# Patient Record
Sex: Male | Born: 1955
Health system: Southern US, Community
[De-identification: ages and names within clinical notes are randomized; demographics above are authoritative.]

## PROBLEM LIST (undated history)

## (undated) DIAGNOSIS — I10 Essential (primary) hypertension: Secondary | ICD-10-CM

## (undated) DIAGNOSIS — E785 Hyperlipidemia, unspecified: Secondary | ICD-10-CM

## (undated) DIAGNOSIS — E119 Type 2 diabetes mellitus without complications: Secondary | ICD-10-CM

## (undated) HISTORY — PX: CIRCUMCISION: SUR203

## (undated) HISTORY — DX: Hyperlipidemia, unspecified: E78.5

## (undated) HISTORY — DX: Essential (primary) hypertension: I10

## (undated) HISTORY — DX: Type 2 diabetes mellitus without complications: E11.9

---

## 1998-12-23 ENCOUNTER — Ambulatory Visit: Admission: RE | Admit: 1998-12-23 | Discharge: 1998-12-23 | Payer: Self-pay | Admitting: Occupational Medicine

## 1998-12-23 ENCOUNTER — Encounter: Payer: Self-pay | Admitting: Occupational Medicine

## 2003-06-07 ENCOUNTER — Emergency Department (HOSPITAL_COMMUNITY): Admission: EM | Admit: 2003-06-07 | Discharge: 2003-06-08 | Payer: Self-pay | Admitting: Emergency Medicine

## 2003-07-05 ENCOUNTER — Encounter: Admission: RE | Admit: 2003-07-05 | Discharge: 2003-10-03 | Payer: Self-pay | Admitting: Endocrinology

## 2004-03-26 ENCOUNTER — Emergency Department (HOSPITAL_COMMUNITY): Admission: EM | Admit: 2004-03-26 | Discharge: 2004-03-26 | Payer: Self-pay | Admitting: Emergency Medicine

## 2008-03-19 ENCOUNTER — Emergency Department (HOSPITAL_COMMUNITY): Admission: EM | Admit: 2008-03-19 | Discharge: 2008-03-19 | Payer: Self-pay | Admitting: Emergency Medicine

## 2010-02-07 ENCOUNTER — Emergency Department (HOSPITAL_COMMUNITY): Admission: EM | Admit: 2010-02-07 | Discharge: 2010-02-07 | Payer: Self-pay | Admitting: Family Medicine

## 2010-06-14 ENCOUNTER — Emergency Department (HOSPITAL_COMMUNITY): Payer: No Typology Code available for payment source

## 2010-06-14 ENCOUNTER — Emergency Department (HOSPITAL_COMMUNITY)
Admission: EM | Admit: 2010-06-14 | Discharge: 2010-06-14 | Disposition: A | Discharge status: Home / Self Care | Payer: No Typology Code available for payment source | Attending: Emergency Medicine | Admitting: Emergency Medicine

## 2010-06-14 DIAGNOSIS — M25559 Pain in unspecified hip: Secondary | ICD-10-CM | POA: Insufficient documentation

## 2010-06-14 DIAGNOSIS — S93409A Sprain of unspecified ligament of unspecified ankle, initial encounter: Secondary | ICD-10-CM | POA: Insufficient documentation

## 2010-06-14 DIAGNOSIS — M25579 Pain in unspecified ankle and joints of unspecified foot: Secondary | ICD-10-CM | POA: Insufficient documentation

## 2010-06-14 DIAGNOSIS — E119 Type 2 diabetes mellitus without complications: Secondary | ICD-10-CM | POA: Insufficient documentation

## 2010-06-14 DIAGNOSIS — IMO0002 Reserved for concepts with insufficient information to code with codable children: Secondary | ICD-10-CM | POA: Insufficient documentation

## 2011-02-10 LAB — URINALYSIS, ROUTINE W REFLEX MICROSCOPIC
Nitrite: NEGATIVE
Specific Gravity, Urine: 1.028
Urobilinogen, UA: 0.2
pH: 6

## 2011-02-15 DIAGNOSIS — E119 Type 2 diabetes mellitus without complications: Secondary | ICD-10-CM | POA: Insufficient documentation

## 2011-06-21 ENCOUNTER — Ambulatory Visit (INDEPENDENT_AMBULATORY_CARE_PROVIDER_SITE_OTHER): Payer: 59 | Admitting: Physician Assistant

## 2011-06-21 ENCOUNTER — Encounter: Payer: Self-pay | Admitting: Physician Assistant

## 2011-06-21 VITALS — BP 145/88 | HR 71 | Temp 98.6°F | Resp 18 | Ht 66.0 in | Wt 188.0 lb

## 2011-06-21 DIAGNOSIS — Z0000 Encounter for general adult medical examination without abnormal findings: Secondary | ICD-10-CM

## 2011-06-21 DIAGNOSIS — E785 Hyperlipidemia, unspecified: Secondary | ICD-10-CM

## 2011-06-21 DIAGNOSIS — E782 Mixed hyperlipidemia: Secondary | ICD-10-CM

## 2011-06-21 DIAGNOSIS — IMO0001 Reserved for inherently not codable concepts without codable children: Secondary | ICD-10-CM

## 2011-06-21 DIAGNOSIS — N529 Male erectile dysfunction, unspecified: Secondary | ICD-10-CM

## 2011-06-21 DIAGNOSIS — Z1211 Encounter for screening for malignant neoplasm of colon: Secondary | ICD-10-CM

## 2011-06-21 DIAGNOSIS — E119 Type 2 diabetes mellitus without complications: Secondary | ICD-10-CM | POA: Insufficient documentation

## 2011-06-21 DIAGNOSIS — I10 Essential (primary) hypertension: Secondary | ICD-10-CM

## 2011-06-21 LAB — COMPREHENSIVE METABOLIC PANEL
Albumin: 4.5 g/dL (ref 3.5–5.2)
Alkaline Phosphatase: 64 U/L (ref 39–117)
CO2: 26 mEq/L (ref 19–32)
Chloride: 104 mEq/L (ref 96–112)
Glucose, Bld: 167 mg/dL — ABNORMAL HIGH (ref 70–99)
Potassium: 4.1 mEq/L (ref 3.5–5.3)
Sodium: 138 mEq/L (ref 135–145)
Total Protein: 7.4 g/dL (ref 6.0–8.3)

## 2011-06-21 LAB — POCT CBC
HCT, POC: 44.3 % (ref 43.5–53.7)
Hemoglobin: 15 g/dL (ref 14.1–18.1)
Lymph, poc: 2.1 (ref 0.6–3.4)
MCH, POC: 26.6 pg — AB (ref 27–31.2)
MCHC: 33.9 g/dL (ref 31.8–35.4)
MCV: 78.5 fL — AB (ref 80–97)
WBC: 5.3 10*3/uL (ref 4.6–10.2)

## 2011-06-21 LAB — MICROALBUMIN, URINE: Microalb, Ur: 1.6 mg/dL (ref 0.00–1.89)

## 2011-06-21 LAB — LIPID PANEL
LDL Cholesterol: 63 mg/dL (ref 0–99)
Triglycerides: 47 mg/dL (ref <150)

## 2011-06-21 MED ORDER — SILDENAFIL CITRATE 100 MG PO TABS: 100.0000 mg | ORAL_TABLET | Freq: Every day | ORAL | Status: DC | PRN

## 2011-06-21 NOTE — Progress Notes (Signed)
Subjective:    Patient ID: Ricardo Mills, male    DOB: Dec 22, 1955, 56 y.o.   MRN: [id number]  HPI Patient presents for complete physical. He is also looking to establish with a new primary care provider. He was previously followed at [facility] but there [employer] office has closed.  He has diabetes mellitus type 2. Last A1c was about 9. He had been at about 6% when he was exercising regularly.  Irregular, blood sugar checks. He has seen his eye doctor in the last 12 months. Last dental visit was several years ago. He only has 2 teeth remaining, notes that need to be pulled. Wears upper and lower denture plates.  He takes his prescribed medications regularly. No hypoglycemia. Hypoglycemia is accompanied by mild symptoms.   Review of Systems  Constitutional: Negative.   HENT: Negative.   Eyes: Negative.   Respiratory: Negative.   Cardiovascular: Negative.   Gastrointestinal: Negative.   Genitourinary: Negative.        Erectile dysfunction.  Viagra as worked well previously.  Musculoskeletal: Negative.   Skin: Negative.   Neurological: Negative.   Hematological: Negative.   Psychiatric/Behavioral: Negative.        Objective:   Physical Exam  Vitals reviewed. Constitutional: He is oriented to person, place, and time. He appears well-developed and well-nourished.  Non-toxic appearance. He does not have a sickly appearance. He does not appear ill. No distress.  HENT:  Head: Normocephalic and atraumatic. No trismus in the jaw.  Right Ear: Hearing, tympanic membrane, external ear and ear canal normal.  Left Ear: Hearing, tympanic membrane, external ear and ear canal normal.  Nose: Nose normal.  Mouth/Throat: Uvula is midline, oropharynx is clear and moist and mucous membranes are normal. He has dentures. No oral lesions. Abnormal dentition. No dental abscesses, uvula swelling, lacerations or dental caries. No oropharyngeal exudate.  Eyes: Conjunctivae and EOM are normal. Pupils  are equal, round, and reactive to light. Right eye exhibits no discharge. Left eye exhibits no discharge. No scleral icterus.  Fundoscopic exam:      The right eye shows no arteriolar narrowing, no AV nicking, no exudate, no hemorrhage and no papilledema. The right eye shows red reflex.The right eye shows no venous pulsations.      The left eye shows no arteriolar narrowing, no AV nicking, no exudate, no hemorrhage and no papilledema. The left eye shows red reflex.The left eye shows no venous pulsations. Neck: Normal range of motion, full passive range of motion without pain and phonation normal. Neck supple. No spinous process tenderness and no muscular tenderness present. No rigidity. No tracheal deviation, no edema, no erythema and normal range of motion present. No thyromegaly present.  Cardiovascular: Normal rate, regular rhythm, S1 normal, S2 normal, normal heart sounds, intact distal pulses and normal pulses.  Exam reveals no gallop and no friction rub.   No murmur heard. Pulmonary/Chest: Effort normal and breath sounds normal. No respiratory distress. He has no wheezes. He has no rales.  Abdominal: Soft. Normal appearance and bowel sounds are normal. He exhibits no distension and no mass. There is no hepatosplenomegaly. There is no tenderness. There is no rebound and no guarding. No hernia. Hernia confirmed negative in the right inguinal area and confirmed negative in the left inguinal area.  Genitourinary: Rectum normal, prostate normal, testes normal and penis normal. Guaiac negative stool. Circumcised. No phimosis, paraphimosis, hypospadias, penile erythema or penile tenderness. No discharge found.  Musculoskeletal: Normal range of motion. He exhibits no  edema and no tenderness.       Right shoulder: Normal.       Left shoulder: Normal.       Right elbow: Normal.      Left elbow: Normal.       Right wrist: Normal.       Left wrist: Normal.       Right hip: Normal.       Left hip: Normal.        Right knee: Normal.       Left knee: Normal.       Right ankle: Normal. Achilles tendon normal.       Left ankle: Normal. Achilles tendon normal.       Cervical back: Normal. He exhibits normal range of motion, no tenderness, no bony tenderness, no swelling, no edema, no deformity, no laceration, no pain, no spasm and normal pulse.       Thoracic back: Normal.       Lumbar back: Normal.       Right upper arm: Normal.       Left upper arm: Normal.       Right forearm: Normal.       Left forearm: Normal.       Right hand: Normal.       Left hand: Normal.       Right upper leg: Normal.       Left upper leg: Normal.       Right lower leg: Normal.       Left lower leg: Normal.       Right foot: Normal.       Left foot: Normal.  Lymphadenopathy:       Head (right side): No submental, no submandibular, no tonsillar, no preauricular, no posterior auricular and no occipital adenopathy present.       Head (left side): No submental, no submandibular, no tonsillar, no preauricular, no posterior auricular and no occipital adenopathy present.    He has no cervical adenopathy.       Right: No inguinal and no supraclavicular adenopathy present.       Left: No inguinal and no supraclavicular adenopathy present.  Neurological: He is alert and oriented to person, place, and time. He has normal strength and normal reflexes. He displays no tremor. No cranial nerve deficit. He exhibits normal muscle tone. Coordination and gait normal. GCS eye subscore is 4. GCS verbal subscore is 5. GCS motor subscore is 6.       Sensation to monofilament testing is decreased on the heels bilaterally, likely secondary to thick callous formation.  Skin: Skin is warm, dry and intact. No abrasion, no ecchymosis, no laceration, no lesion and no rash noted. He is not diaphoretic. No cyanosis or erythema. No pallor. Nails show no clubbing.  Psychiatric: He has a normal mood and affect. His speech is normal and behavior is  normal. Judgment and thought content normal. Cognition and memory are normal.   Results for orders placed in visit on 06/21/11  POCT CBC      Component Value Range   WBC 5.3  4.6 - 10.2 (K/uL)   Lymph, poc 2.1  0.6 - 3.4    POC LYMPH PERCENT 39.5  10 - 50 (%L)   MID (cbc) 0.5  0 - 0.9    POC MID % 9.1  0 - 12 (%M)   POC Granulocyte 2.7  2 - 6.9    Granulocyte percent 51.4  37 - 80 (%G)  RBC 5.64  4.69 - 6.13 (M/uL)   Hemoglobin 15.0  14.1 - 18.1 (g/dL)   HCT, POC 16.1  09.6 - 53.7 (%)   MCV 78.5 (*) 80 - 97 (fL)   MCH, POC 26.6 (*) 27 - 31.2 (pg)   MCHC 33.9  31.8 - 35.4 (g/dL)   RDW, POC 04.5     Platelet Count, POC 161  142 - 424 (K/uL)   MPV 13.8  0 - 99.8 (fL)  POCT GLYCOSYLATED HEMOGLOBIN (HGB A1C)      Component Value Range   Hemoglobin A1C 9.8    GLUCOSE, POCT (MANUAL RESULT ENTRY)      Component Value Range   POC Glucose 192    IFOBT (OCCULT BLOOD)      Component Value Range   IFOBT Negative     CMET, Lipids, PSA, TSH, urine microalbumin are pending.       Assessment & Plan:  1. Wellness exam. The patient is referred for colonoscopy. Counseled on the importance of routine dental care. Age-appropriate health guidance is provided.  2. Diabetes mellitus type 2, uncontrolled. Await remaining labs. Will likely add an additional agent. Patient is reminded that regular exercise and routine dental care can improve his glucose.  3. Hypertension. Not controlled today. He is quite frustrated by today's weight and we agreed not to adjust his antihypertensive regimen at this visit.  4. Hyperlipidemia. Await lab results. If LDL is greater than 70 we will increase the simvastatin to 40 mg daily.  5.Erectile Dysfunction. Again reminded him that lifestyle modification and improved glucose control will likely benefit him in this area. Viagra 100 mg 1 by mouth daily as needed.  The patient will continue his regular medications. Followup with me in 3 months. I will contact him  when the remaining lab results are in so that we can make adjustments for improved glucose control.

## 2011-06-21 NOTE — Patient Instructions (Addendum)
If you have not heard from the GI office about the colonoscopy in one week, please call us. If you have not heard from me about your lab work in 2 weeks, please call us.  Keeping you healthy  Get these tests  Blood pressure- Have your blood pressure checked once a year by your healthcare provider.  Normal blood pressure is 120/80  Weight- Have your body mass index (BMI) calculated to screen for obesity.  BMI is a measure of body fat based on height and weight. You can also calculate your own BMI at [email or web address].  Cholesterol- Have your cholesterol checked every year.  Diabetes- Have your blood sugar checked regularly if you have high blood pressure, high cholesterol, have a family history of diabetes or if you are overweight.  Screening for Colon Cancer- Colonoscopy starting at age 21.  Screening may begin sooner depending on your family history and other health conditions. Follow up colonoscopy as directed by your Gastroenterologist.  Screening for Prostate Cancer- Both blood work (PSA) and a rectal exam help screen for Prostate Cancer.  Screening begins at age 76 with African-American men and at age 15 with Caucasian men.  Screening may begin sooner depending on your family history.  Take these medicines  Aspirin- One aspirin daily can help prevent Heart disease and Stroke.  Flu shot- Every fall.  Tetanus- Every 10 years.  Zostavax- Once after the age of 32 to prevent Shingles.  Pneumonia shot- Once after the age of 5; if you are younger than 23, ask your healthcare provider if you need a Pneumonia shot.  Take these steps  Don't smoke- If you do smoke, talk to your doctor about quitting.  For tips on how to quit, go to www.smokefree.gov or call 1-800-QUIT-NOW.  Be physically active- Exercise 5 days a week for at least 30 minutes.  If you are not already physically active start slow and gradually work up to 30 minutes of moderate physical activity.  Examples of  moderate activity include walking briskly, mowing the yard, dancing, swimming, bicycling, etc.  Eat a healthy diet- Eat a variety of healthy food such as fruits, vegetables, low fat milk, low fat cheese, yogurt, lean meant, poultry, fish, beans, tofu, etc. For more information go to www.thenutritionsource.org  Drink alcohol in moderation- Limit alcohol intake to less than two drinks a day. Never drink and drive.  Dentist- Brush and floss twice daily; visit your dentist twice a year.  Depression- Your emotional health is as important as your physical health. If you're feeling down, or losing interest in things you would normally enjoy please talk to your healthcare provider.  Eye exam- Visit your eye doctor every year.  Safe sex- If you may be exposed to a sexually transmitted infection, use a condom.  Seat belts- Seat belts can save your life; always wear one.  Smoke/Carbon Monoxide detectors- These detectors need to be installed on the appropriate level of your home.  Replace batteries at least once a year.  Skin cancer- When out in the sun, cover up and use sunscreen 15 SPF or higher.  Violence- If anyone is threatening you, please tell your healthcare provider.  Living Will/ Health care power of attorney- Speak with your healthcare provider and family.

## 2011-06-22 ENCOUNTER — Other Ambulatory Visit: Payer: Self-pay | Admitting: Physician Assistant

## 2011-06-22 MED ORDER — SAXAGLIPTIN HCL 2.5 MG PO TABS: 2.5000 mg | ORAL_TABLET | Freq: Every day | ORAL | Status: DC

## 2011-06-23 ENCOUNTER — Telehealth: Payer: Self-pay

## 2011-06-23 NOTE — Telephone Encounter (Signed)
Patient requesting physical paper be filled out. Chart is at nurses station.

## 2011-06-24 NOTE — Telephone Encounter (Signed)
I've completed the forms.  Please scan in to patient record and advise him he can pick up the originals.  csj

## 2011-06-24 NOTE — Telephone Encounter (Signed)
Forms copied and in drawer for p/up. LMOM that both pt and wife's forms are ready.

## 2011-06-24 NOTE — Telephone Encounter (Signed)
Chelle, PE form is in your box

## 2011-07-06 ENCOUNTER — Other Ambulatory Visit: Payer: Self-pay

## 2011-07-06 DIAGNOSIS — IMO0001 Reserved for inherently not codable concepts without codable children: Secondary | ICD-10-CM

## 2011-07-06 NOTE — Telephone Encounter (Signed)
metFORMIN (GLUCOPHAGE) 500 MG tablet PT IS REQUESTING REFILLS  [facility] [address]  [phone]

## 2011-07-08 MED ORDER — SAXAGLIPTIN HCL 5 MG PO TABS: 5.0000 mg | ORAL_TABLET | Freq: Every day | ORAL | Status: DC

## 2011-07-08 MED ORDER — METFORMIN HCL 500 MG PO TABS: 1000.0000 mg | ORAL_TABLET | Freq: Two times a day (BID) | ORAL | Status: DC

## 2011-07-08 NOTE — Telephone Encounter (Signed)
I refilled the metformin, and increased the dose of the Onglyza to 5 mg (he can use up the 2.5 mg by taking 2 together each day).

## 2011-07-08 NOTE — Telephone Encounter (Signed)
LMOM on home # with details and instructions below. Asked for CB with any questions

## 2011-08-26 ENCOUNTER — Other Ambulatory Visit: Payer: Self-pay

## 2011-08-26 MED ORDER — SIMVASTATIN 20 MG PO TABS: 20.0000 mg | ORAL_TABLET | Freq: Every evening | ORAL | Status: DC

## 2011-08-26 MED ORDER — GLIPIZIDE ER 10 MG PO TB24: 10.0000 mg | ORAL_TABLET | Freq: Every day | ORAL | Status: DC

## 2011-08-26 MED ORDER — LOSARTAN POTASSIUM 100 MG PO TABS: 100.0000 mg | ORAL_TABLET | Freq: Every day | ORAL | Status: DC

## 2011-10-08 ENCOUNTER — Ambulatory Visit: Payer: 59 | Admitting: Physician Assistant

## 2011-10-31 ENCOUNTER — Other Ambulatory Visit: Payer: Self-pay

## 2011-10-31 DIAGNOSIS — IMO0001 Reserved for inherently not codable concepts without codable children: Secondary | ICD-10-CM

## 2011-10-31 MED ORDER — SAXAGLIPTIN HCL 5 MG PO TABS: 5.0000 mg | ORAL_TABLET | Freq: Every day | ORAL | Status: DC

## 2011-11-13 ENCOUNTER — Other Ambulatory Visit: Payer: Self-pay | Admitting: Physician Assistant

## 2011-11-26 ENCOUNTER — Other Ambulatory Visit: Payer: Self-pay | Admitting: Physician Assistant

## 2011-12-24 ENCOUNTER — Ambulatory Visit (INDEPENDENT_AMBULATORY_CARE_PROVIDER_SITE_OTHER): Payer: 59 | Admitting: Emergency Medicine

## 2011-12-24 VITALS — BP 124/84 | HR 74 | Temp 98.0°F | Resp 16 | Ht 66.0 in | Wt 190.0 lb

## 2011-12-24 DIAGNOSIS — R0989 Other specified symptoms and signs involving the circulatory and respiratory systems: Secondary | ICD-10-CM

## 2011-12-24 DIAGNOSIS — E119 Type 2 diabetes mellitus without complications: Secondary | ICD-10-CM

## 2011-12-24 DIAGNOSIS — E7800 Pure hypercholesterolemia, unspecified: Secondary | ICD-10-CM

## 2011-12-24 DIAGNOSIS — R0609 Other forms of dyspnea: Secondary | ICD-10-CM

## 2011-12-24 DIAGNOSIS — I10 Essential (primary) hypertension: Secondary | ICD-10-CM

## 2011-12-24 DIAGNOSIS — R0683 Snoring: Secondary | ICD-10-CM

## 2011-12-24 LAB — POCT CBC
Hemoglobin: 14.7 g/dL (ref 14.1–18.1)
MCH, POC: 25.9 pg — AB (ref 27–31.2)
MCV: 80.7 fL (ref 80–97)
MID (cbc): 0.5 (ref 0–0.9)
Platelet Count, POC: 161 10*3/uL (ref 142–424)
RBC: 5.68 M/uL (ref 4.69–6.13)
WBC: 6 10*3/uL (ref 4.6–10.2)

## 2011-12-24 MED ORDER — SAXAGLIPTIN HCL 5 MG PO TABS: 5.0000 mg | ORAL_TABLET | Freq: Every day | ORAL | Status: DC

## 2011-12-24 MED ORDER — METFORMIN HCL 500 MG PO TABS: 1000.0000 mg | ORAL_TABLET | Freq: Two times a day (BID) | ORAL | Status: DC

## 2011-12-24 MED ORDER — LOSARTAN POTASSIUM 100 MG PO TABS: 100.0000 mg | ORAL_TABLET | Freq: Every day | ORAL | Status: DC

## 2011-12-24 MED ORDER — GLIPIZIDE ER 10 MG PO TB24: 10.0000 mg | ORAL_TABLET | Freq: Every day | ORAL | Status: DC

## 2011-12-24 MED ORDER — SIMVASTATIN 20 MG PO TABS: 20.0000 mg | ORAL_TABLET | Freq: Every evening | ORAL | Status: DC

## 2011-12-24 NOTE — Progress Notes (Signed)
  Subjective:    Patient ID: Ricardo Mills, male    DOB: 1956/01/08, 56 y.o.   MRN: [id number]  HPI  56 year old male comes in today for medication refill.  Under tx for diabetes and hypertension.  Feels good.  He works in a [employer] and very physical work.  No chest pain and no shortness of breath.  Does get tired.  No trouble with feet.  No other problems and he does see an eye doctor every year.  Last time he had a physical was in February.  Was talked to about a colonoscopy in Feb.  He would like to know what he could do for bad breath.  He was given magic mouth wash before.      Review of Systems     Objective:   Physical Exam HEENT exam is unremarkable. Neck is supple chest is clear. Heart regular rate no murmurs. Abdomen soft nontender. Extremity has a normal filament test the  Results for orders placed in visit on 12/24/11  POCT CBC      Component Value Range   WBC 6.0  4.6 - 10.2 K/uL   Lymph, poc 2.3  0.6 - 3.4   POC LYMPH PERCENT 38.1  10 - 50 %L   MID (cbc) 0.5  0 - 0.9   POC MID % 8.7  0 - 12 %M   POC Granulocyte 3.2  2 - 6.9   Granulocyte percent 53.2  37 - 80 %G   RBC 5.68  4.69 - 6.13 M/uL   Hemoglobin 14.7  14.1 - 18.1 g/dL   HCT, POC 40.9  81.1 - 53.7 %   MCV 80.7  80 - 97 fL   MCH, POC 25.9 (*) 27 - 31.2 pg   MCHC 32.1  31.8 - 35.4 g/dL   RDW, POC 91.4     Platelet Count, POC 161  142 - 424 K/uL   MPV 14.7  0 - 99.8 fL  GLUCOSE, POCT (MANUAL RESULT ENTRY)      Component Value Range   POC Glucose 259 (*) 70 - 99 mg/dl  POCT GLYCOSYLATED HEMOGLOBIN (HGB A1C)      Component Value Range   Hemoglobin A1C 7.6          Assessment & Plan:  Patient here for followup diabetes and hypertension high cholesterol.

## 2011-12-25 LAB — COMPREHENSIVE METABOLIC PANEL
Albumin: 4.5 g/dL (ref 3.5–5.2)
CO2: 28 mEq/L (ref 19–32)
Calcium: 9.2 mg/dL (ref 8.4–10.5)
Chloride: 101 mEq/L (ref 96–112)
Glucose, Bld: 234 mg/dL — ABNORMAL HIGH (ref 70–99)
Potassium: 4.3 mEq/L (ref 3.5–5.3)
Sodium: 136 mEq/L (ref 135–145)
Total Protein: 7.4 g/dL (ref 6.0–8.3)

## 2011-12-25 LAB — LIPID PANEL
Cholesterol: 126 mg/dL (ref 0–200)
Triglycerides: 69 mg/dL (ref <150)

## 2012-03-28 ENCOUNTER — Ambulatory Visit (INDEPENDENT_AMBULATORY_CARE_PROVIDER_SITE_OTHER): Payer: 59 | Admitting: Physician Assistant

## 2012-03-28 VITALS — BP 122/80 | HR 72 | Temp 97.8°F | Resp 16 | Ht 66.0 in | Wt 187.0 lb

## 2012-03-28 DIAGNOSIS — E7800 Pure hypercholesterolemia, unspecified: Secondary | ICD-10-CM

## 2012-03-28 DIAGNOSIS — R6889 Other general symptoms and signs: Secondary | ICD-10-CM

## 2012-03-28 DIAGNOSIS — R196 Halitosis: Secondary | ICD-10-CM | POA: Insufficient documentation

## 2012-03-28 DIAGNOSIS — E785 Hyperlipidemia, unspecified: Secondary | ICD-10-CM

## 2012-03-28 DIAGNOSIS — Z23 Encounter for immunization: Secondary | ICD-10-CM

## 2012-03-28 DIAGNOSIS — N529 Male erectile dysfunction, unspecified: Secondary | ICD-10-CM

## 2012-03-28 DIAGNOSIS — IMO0001 Reserved for inherently not codable concepts without codable children: Secondary | ICD-10-CM

## 2012-03-28 DIAGNOSIS — E119 Type 2 diabetes mellitus without complications: Secondary | ICD-10-CM

## 2012-03-28 DIAGNOSIS — I10 Essential (primary) hypertension: Secondary | ICD-10-CM

## 2012-03-28 LAB — LIPID PANEL
Cholesterol: 132 mg/dL (ref 0–200)
VLDL: 12 mg/dL (ref 0–40)

## 2012-03-28 LAB — COMPREHENSIVE METABOLIC PANEL
ALT: 18 U/L (ref 0–53)
AST: 16 U/L (ref 0–37)
CO2: 25 mEq/L (ref 19–32)
Calcium: 9.5 mg/dL (ref 8.4–10.5)
Chloride: 101 mEq/L (ref 96–112)
Sodium: 135 mEq/L (ref 135–145)
Total Protein: 7.4 g/dL (ref 6.0–8.3)

## 2012-03-28 LAB — POCT GLYCOSYLATED HEMOGLOBIN (HGB A1C): Hemoglobin A1C: 10.8

## 2012-03-28 LAB — TSH: TSH: 1.771 u[IU]/mL (ref 0.350–4.500)

## 2012-03-28 MED ORDER — LOSARTAN POTASSIUM 100 MG PO TABS: 100.0000 mg | ORAL_TABLET | Freq: Every day | ORAL | Status: DC

## 2012-03-28 MED ORDER — GLIPIZIDE ER 10 MG PO TB24: 10.0000 mg | ORAL_TABLET | Freq: Every day | ORAL | Status: DC

## 2012-03-28 MED ORDER — METFORMIN HCL 500 MG PO TABS: 1000.0000 mg | ORAL_TABLET | Freq: Two times a day (BID) | ORAL | Status: DC

## 2012-03-28 MED ORDER — SAXAGLIPTIN HCL 5 MG PO TABS: 5.0000 mg | ORAL_TABLET | Freq: Every day | ORAL | Status: DC

## 2012-03-28 MED ORDER — SIMVASTATIN 20 MG PO TABS: 20.0000 mg | ORAL_TABLET | Freq: Every evening | ORAL | Status: DC

## 2012-03-28 NOTE — Assessment & Plan Note (Signed)
Uncontrolled. Restart Onglyza, and take it EVERY DAY along with Metformin and Glucotrol XL.  Restart exercise regimen and healthy eating choices.  These efforts have been successful previously.  If still uncontrolled at his next visit, we will initiate insulin therapy.  Pneumococcal vaccine given. Refused Flu vaccine, despite urging. F/U with eye specialist.

## 2012-03-28 NOTE — Assessment & Plan Note (Addendum)
Await lab results.  Exercise, healthy eating. Continue Zocor.

## 2012-03-28 NOTE — Assessment & Plan Note (Signed)
Continue oral hygiene.  Consider treatment for reflux if persists once glucose control achieved.

## 2012-03-28 NOTE — Progress Notes (Signed)
Subjective:    Patient ID: Ricardo Mills, male    DOB: 1955-05-31, 56 y.o.   MRN: [id number]  HPI This 56 y.o. male presents for evaluation of chronic medical problems.  He has not been taking Onglyza regularly, and didn't take it at all for several months after his last visit, due to cost.   Frequency of home glucose monitoring: none Sees a dentist Q6 months, eye specialist every two years (he is due now). Checks feet daily. Is not current with influenza vaccine. Is not current with pneumococcal vaccine.  Review of Systems Denies chest pain, shortness of breath, HA, dizziness, vision change, nausea, vomiting, diarrhea, constipation, melena, hematochezia, dysuria, increased urinary urgency or frequency, increased hunger or thirst, unintentional weight change, rash.  Reports that the Viagra is no longer effective. He has difficulty getting and maintaining an erection.  Also c/o bad breath.  Wears dentures, cleans them daily, brushes the tongue daily.  Some reflux at night. Some achiness in both legs, RIGHT hand.  "It's arthritis, I guess."  LEFT hand dominant.  Past Medical History  Diagnosis Date  . Diabetes mellitus without complication   . Hyperlipidemia   . Hypertension    History reviewed. No pertinent past surgical history.  Prior to Admission medications   Medication Sig Start Date End Date Taking? Authorizing Provider  glipiZIDE (GLUCOTROL XL) 10 MG 24 hr tablet Take 1 tablet (10 mg total) by mouth daily. 12/24/11  Yes Collene Gobble, MD  losartan (COZAAR) 100 MG tablet Take 1 tablet (100 mg total) by mouth daily. 12/24/11  Yes Collene Gobble, MD  metFORMIN (GLUCOPHAGE) 500 MG tablet Take 2 tablets (1,000 mg total) by mouth 2 (two) times daily with a meal. NEEDS OFFICE VISIT FOR REFILLS 12/24/11  Yes Collene Gobble, MD  saxagliptin HCl (ONGLYZA) 5 MG TABS tablet Take 1 tablet (5 mg total) by mouth daily. Needs office visit (second notice) 12/24/11  Yes Collene Gobble, MD  simvastatin  (ZOCOR) 20 MG tablet Take 1 tablet (20 mg total) by mouth every evening. 12/24/11  Yes Collene Gobble, MD  sildenafil (VIAGRA) 100 MG tablet Take 1 tablet (100 mg total) by mouth daily as needed for erectile dysfunction. 06/21/11 07/21/11  Denyla Cortese Tessa Lerner, PA-C    No Known Allergies  History   Social History  . Marital Status: Married    Spouse Name: [place]    Number of Children: 4  . Years of Education: 12   Occupational History  . 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 Smoking status: Never Smoker   . Smokeless tobacco: Never Used  . Alcohol Use: No     Comment: former alcohol use/ not currently  . Drug Use: No  . Sexually Active: Yes -- Male partner(s)   Other Topics Concern  . Not on file   Social History Narrative   Lives with his wife.    Family History  Problem Relation Age of Onset  . Diabetes Mother   . Hyperlipidemia Mother   . Hypertension Mother   . Diabetes Father   . Cancer Sister 42    uterus?  . Diabetes Sister   . Hypertension Brother   . Diabetes Sister   . Diabetes Sister   . Diabetes Sister   . Diabetes Brother   . Hyperlipidemia Brother   . Hypertension Brother  Objective:   Physical Exam Blood pressure 122/80, pulse 72, temperature 97.8 F (36.6 C), temperature source Oral, resp. rate 16, height 5\' 6"  (1.676 m), weight 187 lb (84.823 kg), SpO2 98.00%. Body mass index is 30.18 kg/(m^2). Well-developed, well nourished BM who is awake, alert and oriented, in NAD. HEENT: [place]/AT, PERRL, EOMI.  Sclera and conjunctiva are clear.  EAC are patent, TMs are normal in appearance. Nasal mucosa is pink and moist. OP is clear. Fully compensated edentula Neck: supple, non-tender, no lymphadenopathy, thyromegaly. Heart: RRR, no murmur Lungs: normal effort, CTA Extremities: no cyanosis, clubbing or edema. See DM foot exam. Skin: warm and dry without  rash. Psychologic: good mood and appropriate affect, normal speech and behavior.  Results for orders placed in visit on 03/28/12  GLUCOSE, POCT (MANUAL RESULT ENTRY)      Component Value Range   POC Glucose 237 (*) 70 - 99 mg/dl  POCT GLYCOSYLATED HEMOGLOBIN (HGB A1C)      Component Value Range   Hemoglobin A1C 10.8        Assessment & Plan:   1. Type II or unspecified type diabetes mellitus without mention of complication, uncontrolled  POCT glucose (manual entry), POCT glycosylated hemoglobin (Hb A1C), Comprehensive metabolic panel  2. HTN (hypertension)  TSH  3. Hyperlipidemia LDL goal <70  Lipid panel  4. Halitosis    5. ED (erectile dysfunction)  Testosterone  6. Need for pneumococcal vaccination  Pneumococcal polysaccharide vaccine 23-valent greater than or equal to 2yo subcutaneous/IM

## 2012-03-28 NOTE — Patient Instructions (Signed)
Take all your medications, as prescribed, every single day. Restart your exercise program. Check your blood sugar-sometimes fasting (when you haven't had anything to eat or drink for 8-12 hours; it should be below 140) and sometimes 2-3 hours after your largest meal of the day (should be below 180).

## 2012-03-28 NOTE — Assessment & Plan Note (Signed)
Controlled.  Update CMET, also check TSH.  Continue Cozaar.

## 2012-03-28 NOTE — Assessment & Plan Note (Signed)
Refill Viagra.  Likely, loss of effectiveness is due to loss of glucose control.  Await Testosterone level.  Consider referral to urology if symptoms persist once glucose well controlled.

## 2012-03-29 ENCOUNTER — Encounter: Payer: Self-pay | Admitting: Physician Assistant

## 2012-03-29 LAB — TESTOSTERONE: Testosterone: 452.42 ng/dL (ref 300–890)

## 2012-05-14 ENCOUNTER — Ambulatory Visit (INDEPENDENT_AMBULATORY_CARE_PROVIDER_SITE_OTHER): Payer: 59 | Admitting: Family Medicine

## 2012-05-14 VITALS — BP 136/84 | HR 88 | Temp 98.1°F | Resp 16 | Ht 67.0 in | Wt 190.0 lb

## 2012-05-14 DIAGNOSIS — R519 Headache, unspecified: Secondary | ICD-10-CM

## 2012-05-14 DIAGNOSIS — R51 Headache: Secondary | ICD-10-CM

## 2012-05-14 DIAGNOSIS — J019 Acute sinusitis, unspecified: Secondary | ICD-10-CM

## 2012-05-14 LAB — POCT CBC
Granulocyte percent: 52.4 % (ref 37–80)
HCT, POC: 45.9 % (ref 43.5–53.7)
Hemoglobin: 15 g/dL (ref 14.1–18.1)
Lymph, poc: 2.2 (ref 0.6–3.4)
MCH, POC: 26.3 pg — AB (ref 27–31.2)
MCHC: 32.7 g/dL (ref 31.8–35.4)
MCV: 80.6 fL (ref 80–97)
MID (cbc): 0.5 (ref 0–0.9)
MPV: 14 fL (ref 0–99.8)
POC Granulocyte: 2.9 (ref 2–6.9)
POC LYMPH PERCENT: 38.4 %L (ref 10–50)
POC MID %: 9.2 % (ref 0–12)
Platelet Count, POC: 161 10*3/uL (ref 142–424)
RBC: 5.7 M/uL (ref 4.69–6.13)
RDW, POC: 14.8 %
WBC: 5.6 10*3/uL (ref 4.6–10.2)

## 2012-05-14 LAB — POCT SEDIMENTATION RATE: POCT SED RATE: 5 mm/h (ref 0–22)

## 2012-05-14 MED ORDER — FLUTICASONE PROPIONATE 50 MCG/ACT NA SUSP: 2.0000 | Freq: Every day | NASAL | Status: DC

## 2012-05-14 MED ORDER — AMOXICILLIN 875 MG PO TABS: 875.0000 mg | ORAL_TABLET | Freq: Two times a day (BID) | ORAL | Status: DC

## 2012-05-14 NOTE — Progress Notes (Signed)
[facility]:  Office Visit  Chief Complaint:  Chief Complaint  Patient presents with  . Headache    Left side x 2 -3 days    HPI: Ricardo ALLBAUGH is a 57 y.o. male who complains of  2-3 day behind right eye and right temporal area. + allergies. Denies cough, fevers, chill, ear pain, Tried Ibuporfen without relief. Denies confusion. Aching pain, constant , took Ibupfrofen with relief for 4 hrs then HA returns, HAs never had pain lasting this long.  Has history of migrain HATook 4 Ibuprofen yesterdady and got really dizzy. No vision changes. Denies nausea, vomiting. No tinnitus. + stress at work. He states that both diabetes and HTN are well controlled.  Past Medical History  Diagnosis Date  . Diabetes mellitus without complication   . Hyperlipidemia   . Hypertension    History reviewed. No pertinent past surgical history. History   Social History  . Marital Status: Married    Spouse Name: [place]    Number of Children: 4  . Years of Education: 12   Occupational History  . 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 Smoking status: Never Smoker   . Smokeless tobacco: Never Used  . Alcohol Use: No     Comment: former alcohol use/ not currently  . Drug Use: No  . Sexually Active: Yes -- Male partner(s)   Other Topics Concern  . None   Social History Narrative   Lives with his wife.   Family History  Problem Relation Age of Onset  . Diabetes Mother   . Hyperlipidemia Mother   . Hypertension Mother   . Diabetes Father   . Cancer Sister 7    uterus?  . Diabetes Sister   . Hypertension Brother   . Diabetes Sister   . Diabetes Sister   . Diabetes Sister   . Diabetes Brother   . Hyperlipidemia Brother   . Hypertension Brother    No Known Allergies Prior to Admission medications   Medication Sig Start Date End Date Taking? Authorizing Provider  glipiZIDE  (GLUCOTROL XL) 10 MG 24 hr tablet Take 1 tablet (10 mg total) by mouth daily. 03/28/12  Yes Chelle S Jeffery, PA-C  losartan (COZAAR) 100 MG tablet Take 1 tablet (100 mg total) by mouth daily. 03/28/12  Yes Chelle S Jeffery, PA-C  metFORMIN (GLUCOPHAGE) 500 MG tablet Take 2 tablets (1,000 mg total) by mouth 2 (two) times daily with a meal. 03/28/12  Yes Chelle S Jeffery, PA-C  saxagliptin HCl (ONGLYZA) 5 MG TABS tablet Take 1 tablet (5 mg total) by mouth daily. 03/28/12  Yes Chelle S Jeffery, PA-C  sildenafil (VIAGRA) 100 MG tablet Take 1 tablet (100 mg total) by mouth daily as needed for erectile dysfunction. 06/21/11 06/13/12 Yes Chelle S Jeffery, PA-C  simvastatin (ZOCOR) 20 MG tablet Take 1 tablet (20 mg total) by mouth every evening. 03/28/12  Yes Chelle S Jeffery, PA-C     ROS: The patient denies fevers, chills, night sweats, unintentional weight loss, chest pain, palpitations, wheezing, dyspnea on exertion, nausea, vomiting, abdominal pain, dysuria, hematuria, melena, numbness, weakness, or tingling.   All other systems have been reviewed and were otherwise negative with the exception of those mentioned in the HPI and as above.    PHYSICAL EXAM: Filed Vitals:   05/14/12 1612  BP: 136/84  Pulse: 88  Temp: 98.1 F (36.7  C)  Resp: 16   Filed Vitals:   05/14/12 1612  Height: 5\' 7"  (1.702 m)  Weight: 190 lb (86.183 kg)   Body mass index is 29.76 kg/(m^2).  General: Alert, no acute distress HEENT:  Normocephalic, atraumatic, oropharynx patent. TM nl, + left sinus pressure. No exudates. EOMI, PERRLA, fundoscopic exam nl Cardiovascular:  Regular rate and rhythm, no rubs murmurs or gallops.  No Carotid bruits, radial pulse intact. No pedal edema.  Respiratory: Clear to auscultation bilaterally.  No wheezes, rales, or rhonchi.  No cyanosis, no use of accessory musculature GI: No organomegaly, abdomen is soft and non-tender, positive bowel sounds.  No masses. Skin: No rashes. Neurologic:  Facial musculature symmetric. Psychiatric: Patient is appropriate throughout our interaction. Lymphatic: No cervical lymphadenopathy Musculoskeletal: Gait intact.   LABS: Results for orders placed in visit on 05/14/12  POCT CBC      Component Value Range   WBC 5.6  4.6 - 10.2 K/uL   Lymph, poc 2.2  0.6 - 3.4   POC LYMPH PERCENT 38.4  10 - 50 %L   MID (cbc) 0.5  0 - 0.9   POC MID % 9.2  0 - 12 %M   POC Granulocyte 2.9  2 - 6.9   Granulocyte percent 52.4  37 - 80 %G   RBC 5.70  4.69 - 6.13 M/uL   Hemoglobin 15.0  14.1 - 18.1 g/dL   HCT, POC 16.1  09.6 - 53.7 %   MCV 80.6  80 - 97 fL   MCH, POC 26.3 (*) 27 - 31.2 pg   MCHC 32.7  31.8 - 35.4 g/dL   RDW, POC 04.5     Platelet Count, POC 161  142 - 424 K/uL   MPV 14.0  0 - 99.8 fL     EKG/XRAY:   Primary read interpreted by Dr. Conley Rolls at [facility].   ASSESSMENT/PLAN: Encounter Diagnoses  Name Primary?  . Temporal pain Yes  . Acute sinusitis    HA most likely related to sinus issues Rx Amoxacillin 875 mg BID x 10 Days Rx Flonase NS Labs: Pending ESR to r/o temporal arteritis since has temporal pain F/u prn, go to ER for worsening sxs or f/u with [place]    Ameila Weldon PHUONG, DO 05/14/2012 6:27 PM

## 2012-06-30 ENCOUNTER — Ambulatory Visit: Payer: 59 | Admitting: Physician Assistant

## 2012-07-27 ENCOUNTER — Other Ambulatory Visit: Payer: Self-pay | Admitting: Physician Assistant

## 2012-08-21 ENCOUNTER — Ambulatory Visit (INDEPENDENT_AMBULATORY_CARE_PROVIDER_SITE_OTHER): Payer: 59 | Admitting: Family Medicine

## 2012-08-21 VITALS — BP 127/83 | HR 86 | Temp 98.1°F | Resp 16 | Ht 66.5 in | Wt 189.0 lb

## 2012-08-21 DIAGNOSIS — E119 Type 2 diabetes mellitus without complications: Secondary | ICD-10-CM

## 2012-08-21 DIAGNOSIS — Z0000 Encounter for general adult medical examination without abnormal findings: Secondary | ICD-10-CM

## 2012-08-21 DIAGNOSIS — B370 Candidal stomatitis: Secondary | ICD-10-CM

## 2012-08-21 DIAGNOSIS — I10 Essential (primary) hypertension: Secondary | ICD-10-CM

## 2012-08-21 DIAGNOSIS — N529 Male erectile dysfunction, unspecified: Secondary | ICD-10-CM

## 2012-08-21 DIAGNOSIS — E7800 Pure hypercholesterolemia, unspecified: Secondary | ICD-10-CM

## 2012-08-21 LAB — LIPID PANEL
Cholesterol: 117 mg/dL (ref 0–200)
HDL: 38 mg/dL — ABNORMAL LOW (ref >39)
LDL Cholesterol: 65 mg/dL (ref 0–99)
Total CHOL/HDL Ratio: 3.1 Ratio
Triglycerides: 69 mg/dL (ref <150)
VLDL: 14 mg/dL (ref 0–40)

## 2012-08-21 LAB — COMPREHENSIVE METABOLIC PANEL
ALT: 24 U/L (ref 0–53)
AST: 18 U/L (ref 0–37)
Albumin: 4.4 g/dL (ref 3.5–5.2)
Alkaline Phosphatase: 63 U/L (ref 39–117)
BUN: 13 mg/dL (ref 6–23)
CO2: 21 mEq/L (ref 19–32)
Calcium: 9.8 mg/dL (ref 8.4–10.5)
Chloride: 103 mEq/L (ref 96–112)
Creat: 1.01 mg/dL (ref 0.50–1.35)
Glucose, Bld: 231 mg/dL — ABNORMAL HIGH (ref 70–99)
Potassium: 4.1 mEq/L (ref 3.5–5.3)
Sodium: 135 mEq/L (ref 135–145)
Total Bilirubin: 0.4 mg/dL (ref 0.3–1.2)
Total Protein: 7.3 g/dL (ref 6.0–8.3)

## 2012-08-21 LAB — POCT GLYCOSYLATED HEMOGLOBIN (HGB A1C): Hemoglobin A1C: 10.7

## 2012-08-21 MED ORDER — SIMVASTATIN 20 MG PO TABS: 20.0000 mg | ORAL_TABLET | Freq: Every evening | ORAL | Status: DC

## 2012-08-21 MED ORDER — GLIPIZIDE ER 10 MG PO TB24: 10.0000 mg | ORAL_TABLET | Freq: Every day | ORAL | Status: DC

## 2012-08-21 MED ORDER — LOSARTAN POTASSIUM 100 MG PO TABS: 100.0000 mg | ORAL_TABLET | Freq: Every day | ORAL | Status: DC

## 2012-08-21 MED ORDER — VARDENAFIL HCL 20 MG PO TABS: 20.0000 mg | ORAL_TABLET | Freq: Every day | ORAL | Status: DC | PRN

## 2012-08-21 MED ORDER — FLUCONAZOLE 150 MG PO TABS: 150.0000 mg | ORAL_TABLET | Freq: Once | ORAL | Status: DC

## 2012-08-21 MED ORDER — SAXAGLIPTIN HCL 5 MG PO TABS: 5.0000 mg | ORAL_TABLET | Freq: Every day | ORAL | Status: DC

## 2012-08-21 MED ORDER — METFORMIN HCL 500 MG PO TABS: 1000.0000 mg | ORAL_TABLET | Freq: Two times a day (BID) | ORAL | Status: DC

## 2012-08-21 NOTE — Progress Notes (Signed)
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  He needs his meds refilled.  He is having no new problems  We discussed diet and exercise at length (20 minutes)  Obj:  NAD HEENT: geographic tongue, with white coating Chest: clear Heart:  Reg, no murmur Abdomen: soft, nontender Ext: no ulcers, rash, change in sensation or edema Gait:  Normal  We also discussed need for preventive care:  Cardiac and GI Results for orders placed in visit on 08/21/12  POCT GLYCOSYLATED HEMOGLOBIN (HGB A1C)      Result Value Range   Hemoglobin A1C 10.7       Diabetes mellitus - Plan: saxagliptin HCl (ONGLYZA) 5 MG TABS tablet, metFORMIN (GLUCOPHAGE) 500 MG tablet, glipiZIDE (GLUCOTROL XL) 10 MG 24 hr tablet, Comprehensive metabolic panel, POCT glycosylated hemoglobin (Hb A1C), Lipid panel, Ambulatory referral to Cardiology  Erectile dysfunction - Plan: vardenafil (LEVITRA) 20 MG tablet, Ambulatory referral to Cardiology  High cholesterol - Plan: simvastatin (ZOCOR) 20 MG tablet, Ambulatory referral to Cardiology  Hypertension - Plan: losartan (COZAAR) 100 MG tablet, Comprehensive metabolic panel, POCT glycosylated hemoglobin (Hb A1C), Lipid panel, Ambulatory referral to Cardiology  Thrush, oral - Plan: fluconazole (DIFLUCAN) 150 MG tablet  Routine general medical examination at a health care facility - Plan: Ambulatory referral to Gastroenterology

## 2012-08-22 ENCOUNTER — Encounter: Payer: Self-pay | Admitting: Cardiovascular Disease

## 2012-08-22 ENCOUNTER — Encounter: Payer: Self-pay | Admitting: *Deleted

## 2012-08-23 ENCOUNTER — Institutional Professional Consult (permissible substitution): Payer: 59 | Admitting: Cardiovascular Disease

## 2012-08-26 ENCOUNTER — Encounter: Payer: Self-pay | Admitting: Cardiovascular Disease

## 2012-09-14 ENCOUNTER — Other Ambulatory Visit: Payer: Self-pay | Admitting: Family Medicine

## 2012-09-14 ENCOUNTER — Telehealth: Payer: Self-pay

## 2012-09-14 MED ORDER — SILDENAFIL CITRATE 100 MG PO TABS: 100.0000 mg | ORAL_TABLET | ORAL | Status: DC | PRN

## 2012-09-14 NOTE — Telephone Encounter (Signed)
Pharm requests RF of Viagra 100 mg #7.

## 2013-03-01 ENCOUNTER — Telehealth: Payer: Self-pay | Admitting: Radiology

## 2013-03-01 ENCOUNTER — Ambulatory Visit (INDEPENDENT_AMBULATORY_CARE_PROVIDER_SITE_OTHER): Payer: 59 | Admitting: Family Medicine

## 2013-03-01 VITALS — BP 138/84 | HR 80 | Temp 98.5°F | Resp 18 | Ht 66.0 in | Wt 188.0 lb

## 2013-03-01 DIAGNOSIS — R51 Headache: Secondary | ICD-10-CM

## 2013-03-01 DIAGNOSIS — B370 Candidal stomatitis: Secondary | ICD-10-CM

## 2013-03-01 DIAGNOSIS — E119 Type 2 diabetes mellitus without complications: Secondary | ICD-10-CM

## 2013-03-01 LAB — POCT CBC
Granulocyte percent: 70.1 %G (ref 37–80)
HCT, POC: 44.4 % (ref 43.5–53.7)
Hemoglobin: 14.4 g/dL (ref 14.1–18.1)
Lymph, poc: 1.5 (ref 0.6–3.4)
MCH, POC: 26.5 pg — AB (ref 27–31.2)
MCHC: 32.4 g/dL (ref 31.8–35.4)
MCV: 81.6 fL (ref 80–97)
MID (cbc): 0.4 (ref 0–0.9)
MPV: 14.2 fL (ref 0–99.8)
POC Granulocyte: 4.6 (ref 2–6.9)
POC LYMPH PERCENT: 23.3 %L (ref 10–50)
POC MID %: 6.6 %M (ref 0–12)
Platelet Count, POC: 141 10*3/uL — AB (ref 142–424)
RBC: 5.44 M/uL (ref 4.69–6.13)
RDW, POC: 14.3 %
WBC: 6.6 10*3/uL (ref 4.6–10.2)

## 2013-03-01 LAB — POCT GLYCOSYLATED HEMOGLOBIN (HGB A1C): Hemoglobin A1C: 9.7

## 2013-03-01 LAB — GLUCOSE, POCT (MANUAL RESULT ENTRY): POC Glucose: 308 mg/dl — AB (ref 70–99)

## 2013-03-01 MED ORDER — MAGIC MOUTHWASH: 5.0000 mL | Freq: Three times a day (TID) | ORAL | Status: DC

## 2013-03-01 MED ORDER — KETOPROFEN 50 MG PO CAPS: 50.0000 mg | ORAL_CAPSULE | Freq: Four times a day (QID) | ORAL | Status: DC | PRN

## 2013-03-01 MED ORDER — GLIPIZIDE ER 10 MG PO TB24: ORAL_TABLET | ORAL | Status: DC

## 2013-03-01 NOTE — Telephone Encounter (Signed)
Clarified the magic mouthwash.

## 2013-03-01 NOTE — Progress Notes (Signed)
Patient ID: Ricardo Mills, male   DOB: 10-31-1955, 57 y.o.   MRN: [id number]  @[email or web address] @  Patient ID: Ricardo Mills MRN: [id number], DOB: April 04, 1956, 57 y.o. Date of Encounter: 03/01/2013, 10:53 AM This chart was scribed for Ricardo Sidle, MD by Valera Castle, ED Scribe. This patient was seen in room 4 and the patient's care was started at 10:53 AM.   Primary Physician: No primary provider on file.  Chief Complaint: Headache  HPI: 57 y.o. year old male with history below presents with sudden, moderate, intermittent, frontal headaches, onset 2 months. He reports that the headaches usually worsen around afternoon time, and that yesterday was especially bad. He states he has had a h/o headaches, but they seem to be worsening. He states he has tried Ibuprofen, with some relief. He reports associated intermittent, mild chest pains. He reports that his BP has been low recently. Current BP is 138/84  He reports DM runs in his family. He states he is taking BP medication for his current DM. He denies having had a colonoscopy. He reports having a stool test here earlier this year, with negative results. He states that he was last here 4-5 months ago, but isn't sure if he had blood work done there or not for his DM.   He denies SOB, and any other associated symptoms.   He reports he currently is taking a break from work.   Past Medical History  Diagnosis Date  . Diabetes mellitus without complication   . Hyperlipidemia   . Hypertension      Home Meds: Prior to Admission medications   Medication Sig Start Date End Date Taking? Authorizing Provider  glipiZIDE (GLUCOTROL XL) 10 MG 24 hr tablet Take 1 tablet (10 mg total) by mouth daily. 08/21/12  Yes Ricardo Sidle, MD  Ibuprofen (IBU-200 PO) Take by mouth.   Yes Historical Provider, MD  losartan (COZAAR) 100 MG tablet Take 1 tablet (100 mg total) by mouth daily. 08/21/12  Yes Ricardo Sidle, MD  metFORMIN (GLUCOPHAGE) 500 MG tablet Take 2  tablets (1,000 mg total) by mouth 2 (two) times daily with a meal. 08/21/12  Yes Ricardo Sidle, MD  sildenafil (VIAGRA) 100 MG tablet Take 1 tablet (100 mg total) by mouth as needed for erectile dysfunction. 09/14/12  Yes Ricardo Sidle, MD  simvastatin (ZOCOR) 20 MG tablet Take 1 tablet (20 mg total) by mouth every evening. 08/21/12  Yes Ricardo Sidle, MD  vardenafil (LEVITRA) 20 MG tablet Take 1 tablet (20 mg total) by mouth daily as needed for erectile dysfunction. 08/21/12  Yes Ricardo Sidle, MD  fluconazole (DIFLUCAN) 150 MG tablet Take 1 tablet (150 mg total) by mouth once. 08/21/12   Ricardo Sidle, MD  saxagliptin HCl (ONGLYZA) 5 MG TABS tablet Take 1 tablet (5 mg total) by mouth daily. 08/21/12   Ricardo Sidle, MD    Allergies: No Known Allergies  History   Social History  . Marital Status: Married    Spouse Name: [place]    Number of Children: 4  . Years of Education: 12   Occupational History  . 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 Smoking status: Never Smoker   . Smokeless tobacco: Never Used  . Alcohol Use: No     Comment: former alcohol use/ not currently  . Drug Use: No  . Sexual Activity: Yes    Partners: Female   Other Topics  Concern  . Not on file   Social History Narrative   Lives with his wife.     Review of Systems: Constitutional: negative for chills, fever, night sweats, weight changes, or fatigue  HEENT: negative for vision changes, hearing loss, congestion, rhinorrhea, ST, epistaxis, or sinus pressure Cardiovascular: negative for chest pain or palpitations Positive for low BP Respiratory: negative for hemoptysis, wheezing, shortness of breath, or cough Abdominal: negative for abdominal pain, nausea, vomiting, diarrhea, or constipation Dermatological: negative for rash Neurologic: negative for dizziness, or syncope Positive for intermittent, moderate  headache All other systems reviewed and are otherwise negative with the exception to those above and in the HPI.   Physical Exam: Blood pressure 138/84, pulse 80, temperature 98.5 F (36.9 C), temperature source Oral, resp. rate 18, height 5\' 6"  (1.676 m), weight 188 lb (85.276 kg), SpO2 98.00%., Body mass index is 30.36 kg/(m^2). General: Well developed, well nourished, in no acute distress. Head: Normocephalic, atraumatic, eyes without discharge, sclera non-icteric, nares are without discharge. Bilateral auditory canals clear, TM's are without perforation, pearly grey and translucent with reflective cone of light bilaterally. Oral cavity moist, posterior pharynx without exudate, erythema, peritonsillar abscess, or post nasal drip.  Neck: Supple. No thyromegaly. Full ROM. No lymphadenopathy. Lungs: Clear bilaterally to auscultation without wheezes, rales, or rhonchi. Breathing is unlabored. Heart: RRR with S1 S2. No murmurs, rubs, or gallops appreciated. Abdomen: Soft, non-tender, non-distended with normoactive bowel sounds. No hepatomegaly. No rebound/guarding. No obvious abdominal masses. Msk:  Strength and tone normal for age. Extremities/Skin: Warm and dry. No clubbing or cyanosis. No edema. No rashes or suspicious lesions. Neuro: Alert and oriented X 3. Moves all extremities spontaneously. Gait is normal. CNII-XII grossly in tact. Psych:  Responds to questions appropriately with a normal affect.   Labs: Meds ordered this encounter  Medications  . Ibuprofen (IBU-200 PO)    Sig: Take by mouth.   Results for orders placed in visit on 03/01/13  POCT CBC      Result Value Range   WBC 6.6  4.6 - 10.2 K/uL   Lymph, poc 1.5  0.6 - 3.4   POC LYMPH PERCENT 23.3  10 - 50 %L   MID (cbc) 0.4  0 - 0.9   POC MID % 6.6  0 - 12 %M   POC Granulocyte 4.6  2 - 6.9   Granulocyte percent 70.1  37 - 80 %G   RBC 5.44  4.69 - 6.13 M/uL   Hemoglobin 14.4  14.1 - 18.1 g/dL   HCT, POC 16.1  09.6 - 53.7  %   MCV 81.6  80 - 97 fL   MCH, POC 26.5 (*) 27 - 31.2 pg   MCHC 32.4  31.8 - 35.4 g/dL   RDW, POC 04.5     Platelet Count, POC 141 (*) 142 - 424 K/uL   MPV 14.2  0 - 99.8 fL  GLUCOSE, POCT (MANUAL RESULT ENTRY)      Result Value Range   POC Glucose 308 (*) 70 - 99 mg/dl  POCT GLYCOSYLATED HEMOGLOBIN (HGB A1C)      Result Value Range   Hemoglobin A1C 9.7       ASSESSMENT AND PLAN:  57 y.o. year old male with headaches and diabetes. Headache(784.0) - Plan: POCT CBC, POCT glucose (manual entry), POCT glycosylated hemoglobin (Hb A1C), Comprehensive metabolic panel, Lipid panel, ketoprofen (ORUDIS) 50 MG capsule  DM (diabetes mellitus) - Plan: POCT CBC, POCT glucose (manual entry), POCT glycosylated hemoglobin (  Hb A1C), Comprehensive metabolic panel, Lipid panel, Ambulatory referral to Endocrinology  Diabetes mellitus - Plan: glipiZIDE (GLUCOTROL XL) 10 MG 24 hr tablet  Thrush - Plan: Alum & Mag Hydroxide-Simeth (MAGIC MOUTHWASH) SOLN Recheck one month Signed, Ricardo Sidle, MD    Signed, Ricardo Sidle, MD 03/01/2013 10:53 AM

## 2013-03-02 LAB — LIPID PANEL
Cholesterol: 112 mg/dL (ref 0–200)
HDL: 37 mg/dL — ABNORMAL LOW (ref >39)
LDL Cholesterol: 55 mg/dL (ref 0–99)
Total CHOL/HDL Ratio: 3 Ratio
Triglycerides: 102 mg/dL (ref <150)
VLDL: 20 mg/dL (ref 0–40)

## 2013-03-02 LAB — COMPREHENSIVE METABOLIC PANEL
ALT: 13 U/L (ref 0–53)
AST: 11 U/L (ref 0–37)
Albumin: 4.1 g/dL (ref 3.5–5.2)
Alkaline Phosphatase: 70 U/L (ref 39–117)
BUN: 9 mg/dL (ref 6–23)
CO2: 23 mEq/L (ref 19–32)
Calcium: 9.1 mg/dL (ref 8.4–10.5)
Chloride: 104 mEq/L (ref 96–112)
Creat: 0.89 mg/dL (ref 0.50–1.35)
Glucose, Bld: 280 mg/dL — ABNORMAL HIGH (ref 70–99)
Potassium: 4.1 mEq/L (ref 3.5–5.3)
Sodium: 135 mEq/L (ref 135–145)
Total Bilirubin: 0.3 mg/dL (ref 0.3–1.2)
Total Protein: 7 g/dL (ref 6.0–8.3)

## 2013-03-03 ENCOUNTER — Ambulatory Visit (INDEPENDENT_AMBULATORY_CARE_PROVIDER_SITE_OTHER): Payer: 59 | Admitting: Family Medicine

## 2013-03-03 VITALS — BP 140/90 | HR 88 | Temp 98.2°F | Resp 20 | Ht 66.5 in | Wt 190.8 lb

## 2013-03-03 DIAGNOSIS — IMO0001 Reserved for inherently not codable concepts without codable children: Secondary | ICD-10-CM

## 2013-03-03 DIAGNOSIS — R51 Headache: Secondary | ICD-10-CM

## 2013-03-03 LAB — GLUCOSE, POCT (MANUAL RESULT ENTRY): POC Glucose: 170 mg/dl — AB (ref 70–99)

## 2013-03-03 NOTE — Progress Notes (Signed)
[facility]:  Office Visit  Chief Complaint:  Chief Complaint  Patient presents with  . Headache    seen on 10/22, pt states headache is not getting any better  . medication    Pt states he took 100mg  of Metformin this AM to try to bring sugar down- he plans to take 1500mg  of Metformin tonight- please advise    HPI: Ricardo Mills is a 57 y.o. male who is here for continued headaches, He saw Dr. Milus Glazier on 03/01/2013 for headaches and elevated blood sugar, he is being seen for the same things and his sugar was 300 this am, He's been noncompliant with his medications and diet regiment and follow-up, Dr. Orlean Bradford him 2 days ago and told him to double his glucotrol. He would like his blood sugar rechecked.   He did not tell Dr Milus Glazier this , but Recently his son came home with a medicine that he got over the counter and it was a black pill called "[place]" supposed to lower his blood sugar, he then started having some cramps in his hands and neck cramps, last pill was taken on 1 week ago.   Headache is better since yesterday, has only taken ketoprofen.   Past Medical History  Diagnosis Date  . Diabetes mellitus without complication   . Hyperlipidemia   . Hypertension    History reviewed. No pertinent past surgical history. History   Social History  . Marital Status: Married    Spouse Name: [place]    Number of Children: 4  . Years of Education: 12   Occupational History  . 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 Smoking status: Never Smoker   . Smokeless tobacco: Never Used  . Alcohol Use: No     Comment: former alcohol use/ not currently  . Drug Use: No  . Sexual Activity: Yes    Partners: Female   Other Topics Concern  . None   Social History Narrative   Lives with his wife.   Family History  Problem Relation Age of Onset  . Diabetes Mother   .  Hyperlipidemia Mother   . Hypertension Mother   . Diabetes Father   . Cancer Sister 89    uterus?  . Diabetes Sister   . Hypertension Brother   . Diabetes Sister   . Diabetes Sister   . Diabetes Sister   . Diabetes Brother   . Hyperlipidemia Brother   . Hypertension Brother    No Known Allergies Prior to Admission medications   Medication Sig Start Date End Date Taking? Authorizing Provider  Alum & Mag Hydroxide-Simeth (MAGIC MOUTHWASH) SOLN Take 5 mLs by mouth 3 (three) times daily. 03/01/13  Yes [name], MD  fluconazole (DIFLUCAN) 150 MG tablet Take 1 tablet (150 mg total) by mouth once. 08/21/12  Yes [name], MD  glipiZIDE (GLUCOTROL XL) 10 MG 24 hr tablet Bid 03/01/13  Yes [name], MD  Ibuprofen (IBU-200 PO) Take by mouth.   Yes Historical Provider, MD  ketoprofen (ORUDIS) 50 MG capsule Take 1 capsule (50 mg total) by mouth 4 (four) times daily as needed for pain. 03/01/13  Yes [name], MD  losartan (COZAAR) 100 MG tablet Take 1 tablet (100 mg total) by mouth daily. 08/21/12  Yes [name], MD  metFORMIN (GLUCOPHAGE) 500 MG tablet Take 2 tablets (1,000 mg total) by  mouth 2 (two) times daily with a meal. 08/21/12  Yes [name], MD  saxagliptin HCl (ONGLYZA) 5 MG TABS tablet Take 1 tablet (5 mg total) by mouth daily. 08/21/12  Yes [name], MD  sildenafil (VIAGRA) 100 MG tablet Take 1 tablet (100 mg total) by mouth as needed for erectile dysfunction. 09/14/12  Yes [name], MD  simvastatin (ZOCOR) 20 MG tablet Take 1 tablet (20 mg total) by mouth every evening. 08/21/12  Yes [name], MD  vardenafil (LEVITRA) 20 MG tablet Take 1 tablet (20 mg total) by mouth daily as needed for erectile dysfunction. 08/21/12  Yes [name], MD     ROS: The patient denies fevers, chills, night sweats, unintentional weight loss, chest pain, palpitations, wheezing, dyspnea on exertion, nausea, vomiting, abdominal pain, dysuria, hematuria,  melena, numbness, weakness, or tingling.   All other systems have been reviewed and were otherwise negative with the exception of those mentioned in the HPI and as above.    PHYSICAL EXAM: Filed Vitals:   03/03/13 1136  BP: 156/92  Pulse: 88  Temp: 98.2 F (36.8 C)  Resp: 20   Filed Vitals:   03/03/13 1136  Height: 5' 6.5" (1.689 m)  Weight: 190 lb 12.8 oz (86.546 kg)   Body mass index is 30.34 kg/(m^2).  General: Alert, no acute distress HEENT:  Normocephalic, atraumatic, oropharynx patent. EOMI, PERRLA, fundoscopic exam nl Cardiovascular:  Regular rate and rhythm, no rubs murmurs or gallops.  No Carotid bruits, radial pulse intact. No pedal edema.  Respiratory: Clear to auscultation bilaterally.  No wheezes, rales, or rhonchi.  No cyanosis, no use of accessory musculature GI: No organomegaly, abdomen is soft and non-tender, positive bowel sounds.  No masses. Skin: No rashes. Neurologic: Facial musculature symmetric. CN 2-12 grossly normal. No CVA/TIA stroke like signs on exam Psychiatric: Patient is appropriate throughout our interaction. Lymphatic: No cervical lymphadenopathy Musculoskeletal: Gait intact.   LABS: Results for orders placed in visit on 03/03/13  GLUCOSE, POCT (MANUAL RESULT ENTRY)      Result Value Range   POC Glucose 170 (*) 70 - 99 mg/dl     EKG/XRAY:   Primary read interpreted by Dr. Conley Rolls at [facility].   ASSESSMENT/PLAN: Encounter Diagnoses  Name Primary?  . Type II or unspecified type diabetes mellitus without mention of complication, uncontrolled Yes  . Headache(784.0)     HA better since 2 days ago, Sugars are much better as well Gave him tylenol, he can take tylenol and ketoprofen q6-8 hrs prn Will send to Diabetes education for nutritional counseling Advise him that he has had better control as long as he was compliant with his meds He will f/u with endocrinology, he is going to do life style modifications for now He decline flu vaccine He  is UTD on his PNA vaccine ( 2011)  He is not UTD with his eye exam, several years ago, advise to go see eye specialist for DM eye check Recommend : ADA diet, BP goal <140/90, daily foot exams, tobacco cessation if smoking, annual eye exam, annual flu vaccine, PNA vaccine if age and time appropriate.  F/u for woresning sxs Gross sideeffects, risk and benefits, and alternatives of medications d/w patient. Patient is aware that all medications have potential sideeffects and we are unable to predict every sideeffect or drug-drug interaction that may occur.  LE, THAO PHUONG, DO 03/03/2013 1:06 PM   03/06/2013--HA much better, he will schedule eye exam this week

## 2013-03-09 ENCOUNTER — Ambulatory Visit (INDEPENDENT_AMBULATORY_CARE_PROVIDER_SITE_OTHER): Payer: 59 | Admitting: Endocrinology

## 2013-03-09 ENCOUNTER — Encounter: Payer: Self-pay | Admitting: Endocrinology

## 2013-03-09 VITALS — BP 142/86 | HR 94 | Temp 98.2°F | Resp 10 | Ht 67.5 in | Wt 188.4 lb

## 2013-03-09 DIAGNOSIS — IMO0001 Reserved for inherently not codable concepts without codable children: Secondary | ICD-10-CM

## 2013-03-09 MED ORDER — GLUCOSE BLOOD VI STRP: 1.0000 | ORAL_STRIP | Freq: Every day | Status: DC

## 2013-03-09 MED ORDER — CANAGLIFLOZIN 300 MG PO TABS: 1.0000 | ORAL_TABLET | Freq: Every day | ORAL | Status: DC

## 2013-03-09 NOTE — Patient Instructions (Addendum)
good diet and exercise habits significanly improve the control of your diabetes.  please let me know if you wish to be referred to a dietician.  high blood sugar is very risky to your health.  you should see an eye doctor every year.  You are at higher than average risk for pneumonia and hepatitis-B.  You should be vaccinated against both.   controlling your blood pressure and cholesterol drastically reduces the damage diabetes does to your body.  this also applies to quitting smoking.  please discuss these with your doctor.   check your blood sugar once a day.  vary the time of day when you check, between before the 3 meals, and at bedtime.  also check if you have symptoms of your blood sugar being too high or too low.  please keep a record of the readings and bring it to your next appointment here.  You can write it on any piece of paper.  please call us sooner if your blood sugar goes below 70, or if you have a lot of readings over 200.   i have sent a prescription to your pharmacy, to add "invokana."   Please call and tell us which diabetes medication you are allergic to.   Please come back for a follow-up appointment in 2 weeks.

## 2013-03-09 NOTE — Progress Notes (Signed)
Subjective:    Patient ID: Ricardo Mills, male    DOB: 29-Dec-1955, 57 y.o.   MRN: [id number]  HPI pt states DM was dx'ed in 1999; he has mild if any neuropathy of the lower extremities; he is unaware of any associated chronic complications.  he has never been on insulin.  pt says his diet and exercise are good.  He does not check cbg's.  He refuses insulin for now.   Past Medical History  Diagnosis Date  . Diabetes mellitus without complication   . Hyperlipidemia   . Hypertension     History reviewed. No pertinent past surgical history.  History   Social History  . Marital Status: Married    Spouse Name: [place]    Number of Children: 4  . Years of Education: 12   Occupational History  . 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 Smoking status: Never Smoker   . Smokeless tobacco: Never Used  . Alcohol Use: No     Comment: former alcohol use/ not currently  . Drug Use: No  . Sexual Activity: Yes    Partners: Female   Other Topics Concern  . Not on file   Social History Narrative   Malen Gauze Parents   Lives with his wife.   Regular exercise: occasionally   Caffeine use: coffee daily    Current Outpatient Prescriptions on File Prior to Visit  Medication Sig Dispense Refill  . Alum & Mag Hydroxide-Simeth (MAGIC MOUTHWASH) SOLN Take 5 mLs by mouth 3 (three) times daily.  240 mL  0  . fluconazole (DIFLUCAN) 150 MG tablet Take 1 tablet (150 mg total) by mouth once.  1 tablet  0  . glipiZIDE (GLUCOTROL XL) 10 MG 24 hr tablet Bid  90 tablet  1  . Ibuprofen (IBU-200 PO) Take by mouth.      . ketoprofen (ORUDIS) 50 MG capsule Take 1 capsule (50 mg total) by mouth 4 (four) times daily as needed for pain.  30 capsule  0  . losartan (COZAAR) 100 MG tablet Take 1 tablet (100 mg total) by mouth daily.  90 tablet  1  . metFORMIN (GLUCOPHAGE) 500 MG tablet Take 2 tablets (1,000 mg total) by mouth 2 (two)  times daily with a meal.  360 tablet  1  . saxagliptin HCl (ONGLYZA) 5 MG TABS tablet Take 1 tablet (5 mg total) by mouth daily.  30 tablet  5  . sildenafil (VIAGRA) 100 MG tablet Take 1 tablet (100 mg total) by mouth as needed for erectile dysfunction.  7 tablet  6  . simvastatin (ZOCOR) 20 MG tablet Take 1 tablet (20 mg total) by mouth every evening.  90 tablet  1  . vardenafil (LEVITRA) 20 MG tablet Take 1 tablet (20 mg total) by mouth daily as needed for erectile dysfunction.  10 tablet  11   No current facility-administered medications on file prior to visit.   No Known Allergies  Family History  Problem Relation Age of Onset  . Diabetes Mother   . Hyperlipidemia Mother   . Hypertension Mother   . Diabetes Father   . Cancer Sister 58    uterus?  . Diabetes Sister   . Hypertension Brother   . Diabetes Sister   . Diabetes Sister   . Diabetes Sister   . Diabetes Brother   . Hyperlipidemia Brother   . Hypertension Brother  BP 142/86  Pulse 94  Temp(Src) 98.2 F (36.8 C) (Oral)  Resp 10  Ht 5' 7.5" (1.715 m)  Wt 188 lb 6.4 oz (85.458 kg)  BMI 29.06 kg/m2  SpO2 96%  Review of Systems denies weight loss, blurry vision, chest pain, sob, n/v, urinary frequency, cramps, excessive diaphoresis, memory loss, depression, rhinorrhea, and easy bruising.  He has headache and ED sxs.    Objective:   Physical Exam VS: see vs page GEN: no distress HEAD: head: no deformity eyes: no periorbital swelling, no proptosis external nose and ears are normal mouth: no lesion seen NECK: supple, thyroid is not enlarged CHEST WALL: no deformity LUNGS: clear to auscultation BREASTS:  No gynecomastia CV: reg rate and rhythm, no murmur ABD: abdomen is soft, nontender.  no hepatosplenomegaly.  not distended.  no hernia MUSCULOSKELETAL: muscle bulk and strength are grossly normal.  no obvious joint swelling.  gait is normal and steady PULSES:  no carotid bruit NEURO:  cn 2-12 grossly intact.    readily moves all 4's.  SKIN:  Normal texture and temperature.  No rash or suspicious lesion is visible.   NODES:  None palpable at the neck PSYCH: alert, oriented x3.  Does not appear anxious nor depressed.  Lab Results  Component Value Date   HGBA1C 9.7 03/01/2013      Assessment & Plan:  DM: poor control Noncompliance with recommendation to take insulin. ED sxs. Possibly due to DM

## 2013-03-14 ENCOUNTER — Emergency Department (HOSPITAL_COMMUNITY)
Admission: EM | Admit: 2013-03-14 | Discharge: 2013-03-14 | Disposition: A | Discharge status: Home / Self Care | Payer: 59 | Attending: Emergency Medicine | Admitting: Emergency Medicine

## 2013-03-14 ENCOUNTER — Encounter (HOSPITAL_COMMUNITY): Payer: Self-pay | Admitting: Emergency Medicine

## 2013-03-14 ENCOUNTER — Emergency Department (HOSPITAL_COMMUNITY): Payer: 59

## 2013-03-14 DIAGNOSIS — E785 Hyperlipidemia, unspecified: Secondary | ICD-10-CM | POA: Insufficient documentation

## 2013-03-14 DIAGNOSIS — E119 Type 2 diabetes mellitus without complications: Secondary | ICD-10-CM | POA: Insufficient documentation

## 2013-03-14 DIAGNOSIS — M542 Cervicalgia: Secondary | ICD-10-CM | POA: Insufficient documentation

## 2013-03-14 DIAGNOSIS — I10 Essential (primary) hypertension: Secondary | ICD-10-CM | POA: Insufficient documentation

## 2013-03-14 DIAGNOSIS — R51 Headache: Secondary | ICD-10-CM | POA: Insufficient documentation

## 2013-03-14 DIAGNOSIS — R519 Headache, unspecified: Secondary | ICD-10-CM

## 2013-03-14 DIAGNOSIS — Z79899 Other long term (current) drug therapy: Secondary | ICD-10-CM | POA: Insufficient documentation

## 2013-03-14 LAB — GLUCOSE, CAPILLARY: Glucose-Capillary: 114 mg/dL — ABNORMAL HIGH (ref 70–99)

## 2013-03-14 MED ORDER — KETOPROFEN 50 MG PO CAPS: 50.0000 mg | ORAL_CAPSULE | Freq: Four times a day (QID) | ORAL | Status: DC | PRN

## 2013-03-14 NOTE — ED Provider Notes (Signed)
Medical screening examination/treatment/procedure(s) were performed by non-physician practitioner and as supervising physician I was immediately available for consultation/collaboration.  EKG Interpretation   None         Enid Skeens, MD 03/14/13 [phone]

## 2013-03-14 NOTE — ED Provider Notes (Signed)
CSN: [id number]     Arrival date & time 03/14/13  0907 History  This chart was scribed for non-physician practitioner, Emilia Beck, PA-C working with Enid Skeens, MD by Greggory Stallion, ED scribe. This patient was seen in room TR07C/TR07C and the patient's care was started at 9:53 AM.   Chief Complaint  Patient presents with  . Headache   The history is provided by the patient. No language interpreter was used.   HPI Comments: Ricardo Mills is a 57 y.o. male who presents to the Emergency Department complaining of intermittent throbbing headaches that started 1 year ago. He states it has been fairly constant lately. Pt went to his PCP and they thought it was due to elevated blood sugar and started him on new blood sugar medications with no relief. He states he hasn't been checking his blood sugar. Pt states he has had left sided neck pain as well. He denies LOC, visual disturbance, fever.   Past Medical History  Diagnosis Date  . Diabetes mellitus without complication   . Hyperlipidemia   . Hypertension    History reviewed. No pertinent past surgical history. Family History  Problem Relation Age of Onset  . Diabetes Mother   . Hyperlipidemia Mother   . Hypertension Mother   . Diabetes Father   . Cancer Sister 40    uterus?  . Diabetes Sister   . Hypertension Brother   . Diabetes Sister   . Diabetes Sister   . Diabetes Sister   . Diabetes Brother   . Hyperlipidemia Brother   . Hypertension Brother    History  Substance Use Topics  . Smoking status: Never Smoker   . Smokeless tobacco: Never Used  . Alcohol Use: No     Comment: former alcohol use/ not currently    Review of Systems  Constitutional: Negative for fever.  Eyes: Negative for visual disturbance.  Musculoskeletal: Positive for neck pain.  Neurological: Positive for headaches. Negative for syncope.  All other systems reviewed and are negative.    Allergies  Review of patient's allergies indicates  no known allergies.  Home Medications   Current Outpatient Rx  Name  Route  Sig  Dispense  Refill  . Alum & Mag Hydroxide-Simeth (MAGIC MOUTHWASH) SOLN   Oral   Take 5 mLs by mouth 3 (three) times daily.   240 mL   0   . Canagliflozin (INVOKANA) 300 MG TABS   Oral   Take 1 tablet (300 mg total) by mouth daily.   30 tablet   11   . fluconazole (DIFLUCAN) 150 MG tablet   Oral   Take 1 tablet (150 mg total) by mouth once.   1 tablet   0   . glipiZIDE (GLUCOTROL XL) 10 MG 24 hr tablet      Bid   90 tablet   1   . glucose blood (ONE TOUCH ULTRA TEST) test strip   Other   1 each by Other route daily. And lancets 1/day 250.02   100 each   12   . Ibuprofen (IBU-200 PO)   Oral   Take by mouth.         . ketoprofen (ORUDIS) 50 MG capsule   Oral   Take 1 capsule (50 mg total) by mouth 4 (four) times daily as needed for pain.   30 capsule   0   . losartan (COZAAR) 100 MG tablet   Oral   Take 1 tablet (100 mg total)  by mouth daily.   90 tablet   1   . metFORMIN (GLUCOPHAGE) 500 MG tablet   Oral   Take 2 tablets (1,000 mg total) by mouth 2 (two) times daily with a meal.   360 tablet   1   . saxagliptin HCl (ONGLYZA) 5 MG TABS tablet   Oral   Take 1 tablet (5 mg total) by mouth daily.   30 tablet   5   . sildenafil (VIAGRA) 100 MG tablet   Oral   Take 1 tablet (100 mg total) by mouth as needed for erectile dysfunction.   7 tablet   6   . simvastatin (ZOCOR) 20 MG tablet   Oral   Take 1 tablet (20 mg total) by mouth every evening.   90 tablet   1   . vardenafil (LEVITRA) 20 MG tablet   Oral   Take 1 tablet (20 mg total) by mouth daily as needed for erectile dysfunction.   10 tablet   11    BP 132/91  Pulse 74  Temp(Src) 98 F (36.7 C) (Oral)  Resp 22  Ht 5' 7.5" (1.715 m)  Wt 186 lb 3.2 oz (84.46 kg)  BMI 28.72 kg/m2  SpO2 98%  Physical Exam  Nursing note and vitals reviewed. Constitutional: He is oriented to person, place, and time. He  appears well-developed and well-nourished. No distress.  HENT:  Head: Normocephalic and atraumatic.  Mouth/Throat: Uvula is midline, oropharynx is clear and moist and mucous membranes are normal.  Eyes: Conjunctivae and EOM are normal. Pupils are equal, round, and reactive to light.  Neck: Neck supple. No tracheal deviation present.  Cardiovascular: Normal rate, regular rhythm and normal heart sounds.   No murmur heard. Pulmonary/Chest: Effort normal and breath sounds normal. No respiratory distress. He has no wheezes. He has no rales.  Musculoskeletal: Normal range of motion.  Neurological: He is alert and oriented to person, place, and time.  Sensation intact. Equal grip strength bilaterally.   Skin: Skin is warm and dry.  Psychiatric: He has a normal mood and affect. His behavior is normal.    ED Course  Procedures (including critical care time)  DIAGNOSTIC STUDIES: Oxygen Saturation is 98% on RA, normal by my interpretation.    COORDINATION OF CARE: 9:59 AM-Discussed treatment plan which includes head CT with pt at bedside and pt agreed to plan.   Labs Review Labs Reviewed  GLUCOSE, CAPILLARY - Abnormal; Notable for the following:    Glucose-Capillary 114 (*)    All other components within normal limits   Imaging Review Ct Head Wo Contrast  03/14/2013   CLINICAL DATA:  Blurred vision, headache  EXAM: CT HEAD WITHOUT CONTRAST  TECHNIQUE: Contiguous axial images were obtained from the base of the skull through the vertex without intravenous contrast.  COMPARISON:  None.  FINDINGS: There is no evidence of mass effect, midline shift or extra-axial fluid collections. There is no evidence of a space-occupying lesion or intracranial hemorrhage. There is no evidence of a cortical-based area of acute infarction.  The ventricles and sulci are appropriate for the patient's age. The basal cisterns are patent.  Visualized portions of the orbits are unremarkable. The visualized portions of the  paranasal sinuses and mastoid air cells are unremarkable.  The osseous structures are unremarkable.  IMPRESSION: No acute intracranial pathology.   Electronically Signed   By: Elige Ko   On: 03/14/2013 10:43    EKG Interpretation   None  MDM   1. Headache     10:00 AM Vitals stable and patient afebrile. No meningeal signs. Patient will have head CT.   10:56 AM Head CT unremarkable for acute changes. Patient will be discharged with pain medication and instructions to follow up with a PCP from the resource guide. Vitals stable and patient afebrile. Patient instructed to return with worsening or concerning symptoms.     I personally performed the services described in this documentation, which was scribed in my presence. The recorded information has been reviewed and is accurate.   Emilia Beck, PA-C 03/14/13 1101

## 2013-03-14 NOTE — ED Notes (Signed)
Pt reports headaches after eating x 3 weeks. Went to PCP and they thought it was due to his elevated blood sugar, started him on some new blood sugar medications but he says the headaches persist. Also says he hasnt been checking his blood sugar

## 2013-03-14 NOTE — ED Notes (Signed)
Pt states he has had headaches on and off for "a couple of months". He thought it might be related to taking Viagra but he has not taken Viagra in 1 month. Describes headaches as "throbbing". Denies N/V or visual disturbances.

## 2013-03-27 ENCOUNTER — Ambulatory Visit: Payer: 59 | Admitting: Endocrinology

## 2013-03-27 DIAGNOSIS — Z0289 Encounter for other administrative examinations: Secondary | ICD-10-CM

## 2013-04-15 ENCOUNTER — Encounter: Payer: 59 | Attending: Family Medicine

## 2013-04-15 VITALS — Ht 67.25 in | Wt 187.3 lb

## 2013-04-15 DIAGNOSIS — Z713 Dietary counseling and surveillance: Secondary | ICD-10-CM | POA: Insufficient documentation

## 2013-04-15 DIAGNOSIS — IMO0001 Reserved for inherently not codable concepts without codable children: Secondary | ICD-10-CM | POA: Insufficient documentation

## 2013-04-15 NOTE — Progress Notes (Signed)
Patient was seen on 04/15/13 for the complete diabetes self-management series at the [facility].  Current A1c = 9.7 on 03/01/13  Handouts given during class include:  Living Well with Diabetes book  Carb Counting and Meal Planning book  Meal Plan Card  Carbohydrate guide  Meal planning worksheet  Low Sodium Flavoring Tips  The diabetes portion plate  Low Carbohydrate Snack Suggestions  A1c to eAG Conversion Chart  Diabetes Medications  Stress Management  Diabetes Recommended Care Schedule  Diabetes Success Plan  Core Class Satisfaction Survey  The following learning objectives were met by the patient during this course:  Describe diabetes  State some common risk factors for diabetes  Defines the role of glucose and insulin  Identifies type of diabetes and pathophysiology  Describe the relationship between diabetes and cardiovascular risk  State the members of the Healthcare Team  States the rationale for glucose monitoring  State when to test glucose  State their individual Target Range  State the importance of logging glucose readings  Describe how to interpret glucose readings  Identifies A1C target  Explain the correlation between A1c and eAG values  State symptoms and treatment of high blood glucose  State symptoms and treatment of low blood glucose  Explain proper technique for glucose testing  Identifies proper sharps disposal  Describe the role of different macronutrients on glucose  Explain how carbohydrates affect blood glucose  State what foods contain the most carbohydrates  Demonstrate carbohydrate counting  Demonstrate how to read Nutrition Facts food label  Describe effects of various fats on heart health  Describe the importance of good nutrition for health and healthy eating strategies  Describe techniques for managing your shopping, cooking and meal planning  List strategies to follow  meal plan when dining out  Describe the effects of alcohol on glucose and how to use it safely   State the amount of activity recommended for healthy living   Describe activities suitable for individual needs   Identify ways to regularly incorporate activity into daily life   Identify barriers to activity and ways to over come these barriers  Identify diabetes medications being personally used and their primary action for lowering glucose and possible side effects   Describe role of stress on blood glucose and develop strategies to address psychosocial issues   Identify diabetes complications and ways to prevent them  Explain how to manage diabetes during illness   Evaluate success in meeting personal goal   Establish 2-3 goals that they will plan to diligently work on until they return for the  44-month follow-up visit  Goals:  Follow Diabetes Meal Plan as instructed  Eat 3 meals and 2 snacks, every 3-5 hrs  Limit carbohydrate intake to 45 grams carbohydrate/meal Limit carbohydrate intake to 15 grams carbohydrate/snack Add lean protein foods to meals/snacks  Monitor glucose levels as instructed by your doctor  Aim for 15-30 mins of physical activity daily as tolerated  Bring food record and glucose log to your next nutrition visit  Your patient has established the following 4 month goals in their individualized success plan:  Count carbs at most meal and snacks  Reduce fat in my diet by eating less bread  Increase my activity at least 3 days per week  Test my glucose at least 1X a day  To help manage my stress, I will do yard work at least 2 times per week  Your patient has identified these potential barriers to change:  Weather  Your patient has identified their diabetes self-care support plan as  [facility] support Group

## 2013-04-21 ENCOUNTER — Other Ambulatory Visit: Payer: Self-pay

## 2013-04-21 DIAGNOSIS — R51 Headache: Secondary | ICD-10-CM

## 2013-04-21 MED ORDER — KETOPROFEN 50 MG PO CAPS: 50.0000 mg | ORAL_CAPSULE | Freq: Four times a day (QID) | ORAL | Status: DC | PRN

## 2013-06-05 ENCOUNTER — Other Ambulatory Visit: Payer: Self-pay | Admitting: Physician Assistant

## 2013-06-09 ENCOUNTER — Other Ambulatory Visit: Payer: Self-pay | Admitting: Physician Assistant

## 2013-06-29 ENCOUNTER — Other Ambulatory Visit: Payer: Self-pay

## 2013-06-29 DIAGNOSIS — E119 Type 2 diabetes mellitus without complications: Secondary | ICD-10-CM

## 2013-06-29 MED ORDER — SAXAGLIPTIN HCL 5 MG PO TABS: 5.0000 mg | ORAL_TABLET | Freq: Every day | ORAL | Status: DC

## 2013-06-29 MED ORDER — GLIPIZIDE ER 10 MG PO TB24: ORAL_TABLET | ORAL | Status: DC

## 2013-07-26 ENCOUNTER — Other Ambulatory Visit: Payer: Self-pay

## 2013-07-26 DIAGNOSIS — E7800 Pure hypercholesterolemia, unspecified: Secondary | ICD-10-CM

## 2013-07-26 MED ORDER — SIMVASTATIN 20 MG PO TABS: 20.0000 mg | ORAL_TABLET | Freq: Every evening | ORAL | Status: DC

## 2013-08-18 ENCOUNTER — Ambulatory Visit: Payer: 59 | Admitting: *Deleted

## 2013-08-21 ENCOUNTER — Ambulatory Visit: Payer: 59

## 2013-08-21 ENCOUNTER — Ambulatory Visit (INDEPENDENT_AMBULATORY_CARE_PROVIDER_SITE_OTHER): Payer: 59 | Admitting: Family Medicine

## 2013-08-21 VITALS — BP 120/80 | HR 66 | Temp 98.0°F | Resp 16 | Ht 66.5 in | Wt 183.0 lb

## 2013-08-21 DIAGNOSIS — R1030 Lower abdominal pain, unspecified: Secondary | ICD-10-CM

## 2013-08-21 DIAGNOSIS — E785 Hyperlipidemia, unspecified: Secondary | ICD-10-CM

## 2013-08-21 DIAGNOSIS — M25569 Pain in unspecified knee: Secondary | ICD-10-CM

## 2013-08-21 DIAGNOSIS — I10 Essential (primary) hypertension: Secondary | ICD-10-CM

## 2013-08-21 DIAGNOSIS — E7800 Pure hypercholesterolemia, unspecified: Secondary | ICD-10-CM

## 2013-08-21 DIAGNOSIS — M549 Dorsalgia, unspecified: Secondary | ICD-10-CM

## 2013-08-21 DIAGNOSIS — N529 Male erectile dysfunction, unspecified: Secondary | ICD-10-CM

## 2013-08-21 DIAGNOSIS — R109 Unspecified abdominal pain: Secondary | ICD-10-CM

## 2013-08-21 DIAGNOSIS — Z1211 Encounter for screening for malignant neoplasm of colon: Secondary | ICD-10-CM

## 2013-08-21 DIAGNOSIS — E119 Type 2 diabetes mellitus without complications: Secondary | ICD-10-CM

## 2013-08-21 LAB — POCT CBC
Granulocyte percent: 57.7 %G (ref 37–80)
HCT, POC: 47.7 % (ref 43.5–53.7)
Hemoglobin: 15.6 g/dL (ref 14.1–18.1)
Lymph, poc: 1.8 (ref 0.6–3.4)
MCH, POC: 26.1 pg — AB (ref 27–31.2)
MCHC: 32.7 g/dL (ref 31.8–35.4)
MCV: 79.7 fL — AB (ref 80–97)
MID (cbc): 0.4 (ref 0–0.9)
MPV: 14.9 fL (ref 0–99.8)
POC Granulocyte: 2.9 (ref 2–6.9)
POC LYMPH PERCENT: 35.3 %L (ref 10–50)
POC MID %: 7 % (ref 0–12)
Platelet Count, POC: 187 10*3/uL (ref 142–424)
RBC: 5.98 M/uL (ref 4.69–6.13)
RDW, POC: 15.6 %
WBC: 5.1 10*3/uL (ref 4.6–10.2)

## 2013-08-21 LAB — LIPID PANEL
Cholesterol: 132 mg/dL (ref 0–200)
HDL: 46 mg/dL (ref >39)
LDL Cholesterol: 74 mg/dL (ref 0–99)
Total CHOL/HDL Ratio: 2.9 Ratio
Triglycerides: 60 mg/dL (ref <150)
VLDL: 12 mg/dL (ref 0–40)

## 2013-08-21 LAB — POCT UA - MICROSCOPIC ONLY
Bacteria, U Microscopic: NEGATIVE
Casts, Ur, LPF, POC: NEGATIVE
Crystals, Ur, HPF, POC: NEGATIVE
Mucus, UA: NEGATIVE
RBC, urine, microscopic: NEGATIVE
WBC, Ur, HPF, POC: NEGATIVE
Yeast, UA: NEGATIVE

## 2013-08-21 LAB — COMPLETE METABOLIC PANEL WITH GFR
ALT: 15 U/L (ref 0–53)
AST: 14 U/L (ref 0–37)
Albumin: 4.7 g/dL (ref 3.5–5.2)
Alkaline Phosphatase: 64 U/L (ref 39–117)
Chloride: 105 mEq/L (ref 96–112)
Potassium: 4.3 mEq/L (ref 3.5–5.3)
Sodium: 138 mEq/L (ref 135–145)
Total Protein: 7.8 g/dL (ref 6.0–8.3)

## 2013-08-21 LAB — POCT URINALYSIS DIPSTICK
Bilirubin, UA: NEGATIVE
Blood, UA: NEGATIVE
Glucose, UA: 500
Ketones, UA: NEGATIVE
Leukocytes, UA: NEGATIVE
Nitrite, UA: NEGATIVE
Protein, UA: NEGATIVE
Spec Grav, UA: 1.02
Urobilinogen, UA: 0.2
pH, UA: 6

## 2013-08-21 LAB — COMPLETE METABOLIC PANEL WITHOUT GFR
BUN: 18 mg/dL (ref 6–23)
CO2: 22 meq/L (ref 19–32)
Calcium: 10 mg/dL (ref 8.4–10.5)
Creat: 1.02 mg/dL (ref 0.50–1.35)
GFR, Est African American: >89 mL/min
GFR, Est Non African American: 82 mL/min
Glucose, Bld: 100 mg/dL — ABNORMAL HIGH (ref 70–99)
Total Bilirubin: 0.5 mg/dL (ref 0.2–1.2)

## 2013-08-21 LAB — POCT GLYCOSYLATED HEMOGLOBIN (HGB A1C): Hemoglobin A1C: 6

## 2013-08-21 LAB — GLUCOSE, POCT (MANUAL RESULT ENTRY): POC Glucose: 99 mg/dl (ref 70–99)

## 2013-08-21 MED ORDER — SIMVASTATIN 20 MG PO TABS: 20.0000 mg | ORAL_TABLET | Freq: Every evening | ORAL | Status: DC

## 2013-08-21 MED ORDER — SAXAGLIPTIN HCL 5 MG PO TABS: 5.0000 mg | ORAL_TABLET | Freq: Every day | ORAL | Status: DC

## 2013-08-21 MED ORDER — METFORMIN HCL 500 MG PO TABS: ORAL_TABLET | ORAL | Status: DC

## 2013-08-21 MED ORDER — LOSARTAN POTASSIUM 100 MG PO TABS: 100.0000 mg | ORAL_TABLET | Freq: Every day | ORAL | Status: DC

## 2013-08-21 MED ORDER — GLIPIZIDE 5 MG PO TABS: 5.0000 mg | ORAL_TABLET | Freq: Every day | ORAL | Status: DC

## 2013-08-21 NOTE — Progress Notes (Signed)
Chief Complaint:  Chief Complaint  Patient presents with  . Diabetes    leg pain, groin and back    HPI: Ricardo Mills is a 58 y.o. male who is here with multiple concerms:   1. 3 month history of Bilateral leg pain all over, is worse with walking and requires him to rest. He denies any history of muscular dystrophy in his family. He does have DM and he states he is better controlled, he denies any neuropathy.  He aslo has hyperlipidemia  And is on a statin but he has been on statins for last 10 years, he is diabetic, deneis neuropathy. He has eye exam in 2 days, last visit they did not find any diabetic retinopathy or cahnges in his eyes due to DM. He does have arthritis and the ketoprofen does help. However it is more for his joints and back pain. He drinks and eats well. He denies any anemia or restless leg sxs, he denies any pedal swelling or CHF sxs.   2. 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 HE has falre ups now and again, He has been taking ketoprofen for this. This helps. He has been tryign to work out more and get his back stronger. He has been doing more stomach exerises to strengthen his core. Denies any numbness/tingling. + weakness. Denies incontinence. HE has been unemployed, has foster kids, he does not do a lot of repetitive work these days.   3. Groin pain x 2 years, it is located on left scrotal sac, last PSA was in 2013, it was normal. He denies dysuria, dc, fevers or chills. He denies BPH, has no problems urianting, he has to get up at night 2 times a night, he has good flow, some dribbling, has completel emptying. He has occ scrotal sac pain, it comes and goes.   4. He is also here for ED issues, has libido, can get an erection but he states he cannot mainatain an erection. HE has abeen on LEvitra and viagra before but he has ahd problems with HAs when he takes the  medicine. HE has 5 biological chldren, fertility has not been an issue.    Past Medical History  Diagnosis Date  . Diabetes mellitus without complication   . Hyperlipidemia   . Hypertension    History reviewed. No pertinent past surgical history. History   Social History  . Marital Status: Married    Spouse Name: [place]    Number of Children: 4  . Years of Education: 12   Occupational History  . 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 Smoking status: Never Smoker   . Smokeless tobacco: Never Used  . Alcohol Use: No     Comment: former alcohol use/ not currently  . Drug Use: No  . Sexual Activity: Yes    Partners: Female   Other Topics Concern  . None   Social History Narrative   [facility]   Lives with his wife.   Regular exercise: occasionally   Caffeine use: coffee daily   Family History  Problem Relation Age of Onset  . Diabetes Mother   . Hyperlipidemia Mother   . Hypertension Mother   . Diabetes Father   . Cancer Sister 60  uterus?  . Diabetes Sister   . Hypertension Brother   . Diabetes Sister   . Diabetes Sister   . Diabetes Sister   . Diabetes Brother   . Hyperlipidemia Brother   . Hypertension Brother    No Known Allergies Prior to Admission medications   Medication Sig Start Date End Date Taking? Authorizing Provider  Alum & Mag Hydroxide-Simeth (MAGIC MOUTHWASH) SOLN Take 5 mLs by mouth 3 (three) times daily. 03/01/13  Yes Elvina SidleKurt Lauenstein, MD  Canagliflozin (INVOKANA) 300 MG TABS Take 1 tablet (300 mg total) by mouth daily. 03/09/13  Yes Romero BellingSean Ellison, MD  fluconazole (DIFLUCAN) 150 MG tablet Take 1 tablet (150 mg total) by mouth once. 08/21/12  Yes Elvina SidleKurt Lauenstein, MD  glipiZIDE (GLUCOTROL XL) 10 MG 24 hr tablet Take 1 tablet by mouth twice daily. 06/29/13  Yes Kaevion Sinclair P Keionte Swicegood, DO  glucose blood (ONE TOUCH ULTRA TEST) test strip 1 each by Other route daily. And  lancets 1/day 250.02 03/09/13  Yes Romero BellingSean Ellison, MD  ketoprofen (ORUDIS) 50 MG capsule Take 1 capsule (50 mg total) by mouth 4 (four) times daily as needed. 03/14/13  Yes Kaitlyn Szekalski, PA-C  ketoprofen (ORUDIS) 50 MG capsule Take 1 capsule (50 mg total) by mouth 4 (four) times daily as needed. 04/21/13  Yes Elvina SidleKurt Lauenstein, MD  losartan (COZAAR) 100 MG tablet Take 1 tablet (100 mg total) by mouth daily. 08/21/12  Yes Elvina SidleKurt Lauenstein, MD  losartan (COZAAR) 100 MG tablet TAKE 1 TABLET BY MOUTH DAILY. 06/09/13  Yes Dedrick Heffner P Susana Duell, DO  metFORMIN (GLUCOPHAGE) 500 MG tablet Take 2 tablets (1,000 mg total) by mouth 2 (two) times daily with a meal. 08/21/12  Yes Elvina SidleKurt Lauenstein, MD  metFORMIN (GLUCOPHAGE) 500 MG tablet TAKE 2 TABLETS BY MOUTH 2 TIMES DAILY WITH A MEAL. 06/05/13  Yes Chelle S Jeffery, PA-C  saxagliptin HCl (ONGLYZA) 5 MG TABS tablet Take 1 tablet (5 mg total) by mouth daily. 06/29/13  Yes Mikenna Bunkley P Nelly Scriven, DO  simvastatin (ZOCOR) 20 MG tablet Take 1 tablet (20 mg total) by mouth every evening. PATIENT NEEDS OFFICE VISIT FOR ADDITIONAL REFILLS 07/26/13  Yes Elvina SidleKurt Lauenstein, MD  vardenafil (LEVITRA) 20 MG tablet Take 1 tablet (20 mg total) by mouth daily as needed for erectile dysfunction. 08/21/12  Yes Elvina SidleKurt Lauenstein, MD  sildenafil (VIAGRA) 100 MG tablet Take 1 tablet (100 mg total) by mouth as needed for erectile dysfunction. 09/14/12   Elvina SidleKurt Lauenstein, MD     ROS: The patient denies fevers, chills, night sweats, unintentional weight loss, chest pain, palpitations, wheezing, dyspnea on exertion, nausea, vomiting, abdominal pain, dysuria, hematuria, melena, numbness,  or tingling.   All other systems have been reviewed and were otherwise negative with the exception of those mentioned in the HPI and as above.    PHYSICAL EXAM: Filed Vitals:   08/21/13 1002  BP: 120/80  Pulse: 66  Temp: 98 F (36.7 C)  Resp: 16   Filed Vitals:   08/21/13 1002  Height: 5' 6.5" (1.689 m)  Weight: 183 lb (83.008 kg)    Body mass index is 29.1 kg/(m^2).  General: Alert, no acute distress HEENT:  Normocephalic, atraumatic, oropharynx patent. EOMI, PERRLA, fundo exam normal Cardiovascular:  Regular rate and rhythm, no rubs murmurs or gallops.  No Carotid bruits, radial pulse intact. No pedal edema.  Respiratory: Clear to auscultation bilaterally.  No wheezes, rales, or rhonchi.  No cyanosis, no use of accessory musculature GI: No organomegaly, abdomen is soft and non-tender,  positive bowel sounds.  No masses. Skin: No rashes. Neurologic: Facial musculature symmetric. Miccrofilament exam neg Psychiatric: Patient is appropriate throughout our interaction. Lymphatic: No cervical lymphadenopathy Musculoskeletal: Gait intact. + paramsk tenderness  Full ROM 5/5 strength, 2/2 DTRs No saddle anesthesia Straight leg negative Hip and knee exam--normal 2/2 DTRS knee, ankles + DP  GU-neg masses, rashes, inguinal hernia, he has tnederness on left scrotal sac on palpation of the epididymis.  LABS: Results for orders placed in visit on 08/21/13  POCT CBC      Result Value Ref Range   WBC 5.1  4.6 - 10.2 K/uL   Lymph, poc 1.8  0.6 - 3.4   POC LYMPH PERCENT 35.3  10 - 50 %L   MID (cbc) 0.4  0 - 0.9   POC MID % 7.0  0 - 12 %M   POC Granulocyte 2.9  2 - 6.9   Granulocyte percent 57.7  37 - 80 %G   RBC 5.98  4.69 - 6.13 M/uL   Hemoglobin 15.6  14.1 - 18.1 g/dL   HCT, POC 82.947.7  56.243.5 - 53.7 %   MCV 79.7 (*) 80 - 97 fL   MCH, POC 26.1 (*) 27 - 31.2 pg   MCHC 32.7  31.8 - 35.4 g/dL   RDW, POC 13.015.6     Platelet Count, POC 187  142 - 424 K/uL   MPV 14.9  0 - 99.8 fL  GLUCOSE, POCT (MANUAL RESULT ENTRY)      Result Value Ref Range   POC Glucose 99  70 - 99 mg/dl  POCT GLYCOSYLATED HEMOGLOBIN (HGB A1C)      Result Value Ref Range   Hemoglobin A1C 6.0    POCT URINALYSIS DIPSTICK      Result Value Ref Range   Color, UA yellow     Clarity, UA clear     Glucose, UA 500     Bilirubin, UA neg     Ketones, UA  neg     Spec Grav, UA 1.020     Blood, UA neg     pH, UA 6.0     Protein, UA neg     Urobilinogen, UA 0.2     Nitrite, UA neg     Leukocytes, UA Negative    POCT UA - MICROSCOPIC ONLY      Result Value Ref Range   WBC, Ur, HPF, POC neg     RBC, urine, microscopic neg     Bacteria, U Microscopic neg     Mucus, UA neg     Epithelial cells, urine per micros 0-3     Crystals, Ur, HPF, POC neg     Casts, Ur, LPF, POC neg     Yeast, UA neg       EKG/XRAY:   [facility] Back xray neg for fx/dislocation   ASSESSMENT/PLAN: Encounter Diagnoses  Name Primary?  . Pain in joint, lower leg Yes  . Groin pain   . Back pain   . Diabetes   . Erectile dysfunction   . Encounter for screening colonoscopy   . Other and unspecified hyperlipidemia     58 y/o male with multiple medical problems including DM, HTN , hyperlipidemia. He has been compliant with meds but noncompliant with follow-up.   1. Bilateral leg pain with walking, better with resting. Get arterial Renel Ende studies,  ? PAD vs Diabetic neuropathy. If vascular eval is negative for PAD then will consider  trial of neurontin  2. ED issues, cannot tolerate viagra/levitra due to HAs and also complains of chronic intermittent scrotal pain.  Refer to urology. ED may be related to DM.However since he needs scrotal pain evaluation  and also ED addressed I will refer to urology. I will defer testosterone testing to them since I do not rx testosterone regularly  3. Diabetes- He has been actively  exercising and changing his diet without much weight loss, he was seen by Dr Everardo All and did not want to be on insulin so he started to be more aggressive with his exercise. Last HbA1c 5 months ago was 9.7 today it is 6.0 . Dr Everardo All put him on Invokana ( he gets thsi for free for 1 year) in addition to his other pre-existing  DM meds including metformin 1 gram BID, Glipizide 10 mg daily, Onkolyza 5 mg daily. Since he has  been exercising more and he does occ get dizzy, weak when he does not eat I will change his glipizide to 5 mg daily with food. He was advise about hypoglycemia sxs. If  A1C in 3 months is well controlled then consider discontinuing Glipizide if still have occ hypoglycemia sxs  Recommend : ADA diet, BP goal <140/90, daily foot exams, tobacco cessation if smoking, annual eye exam, annual flu vaccine, PNA vaccine if age and time appropriate.   Refer for screening colonscopy since not UTD  Will refill meds, will see back in 3 months    Gross sideeffects, risk and benefits, and alternatives of medications d/w patient. Patient is aware that all medications have potential sideeffects and we are unable to predict every sideeffect or drug-drug interaction that may occur.  Lenell Antu, DO 08/21/2013 11:30 AM

## 2013-08-22 LAB — PSA: PSA: 0.91 ng/mL (ref <=4.00)

## 2013-09-01 ENCOUNTER — Encounter: Payer: Self-pay | Admitting: Family Medicine

## 2013-09-27 ENCOUNTER — Telehealth: Payer: Self-pay

## 2013-09-27 NOTE — Telephone Encounter (Signed)
PT STATES THE PHARMACY HAVE BEEN TRYING TO GET A REFILL ON HIS VIAGRA 100MG S. PLEASE CALL [phone]     [facility]

## 2013-09-28 ENCOUNTER — Encounter: Payer: Self-pay | Admitting: Endocrinology

## 2013-09-28 ENCOUNTER — Other Ambulatory Visit: Payer: Self-pay | Admitting: Family Medicine

## 2013-09-28 ENCOUNTER — Ambulatory Visit (INDEPENDENT_AMBULATORY_CARE_PROVIDER_SITE_OTHER): Payer: 59 | Admitting: Endocrinology

## 2013-09-28 VITALS — BP 118/70 | HR 73 | Temp 98.1°F | Ht 66.5 in | Wt 183.0 lb

## 2013-09-28 DIAGNOSIS — N529 Male erectile dysfunction, unspecified: Secondary | ICD-10-CM

## 2013-09-28 MED ORDER — SILDENAFIL CITRATE 20 MG PO TABS: ORAL_TABLET | ORAL | Status: DC

## 2013-09-28 MED ORDER — SILDENAFIL CITRATE 100 MG PO TABS: 100.0000 mg | ORAL_TABLET | ORAL | Status: DC | PRN

## 2013-09-28 MED ORDER — SILDENAFIL CITRATE 20 MG PO TABS: 20.0000 mg | ORAL_TABLET | Freq: Three times a day (TID) | ORAL | Status: DC

## 2013-09-28 NOTE — Patient Instructions (Addendum)
check your blood sugar once a day.  vary the time of day when you check, between before the 3 meals, and at bedtime.  also check if you have symptoms of your blood sugar being too high or too low.  please keep a record of the readings and bring it to your next appointment here.  You can write it on any piece of paper.  please call us sooner if your blood sugar goes below 70, or if you have a lot of readings over 200.   i have sent a prescription to your pharmacy, for the ED medication.    Please come back for a follow-up appointment in 2 months.

## 2013-09-28 NOTE — Progress Notes (Signed)
Subjective:    Patient ID: Ricardo Mills, male    DOB: [birth year], 58 y.o.   MRN: [id number]  HPI pt returns for f/u of type 2 DM (dx'ed 1999; he has mild if any neuropathy of the lower extremities; he is unaware of any associated chronic complications; he has never been on insulin; he refuses insulin for now).  Pt reports ED sxs. Pt says he seldom checks cbg's.  When he does, it is approx 100. Past Medical History  Diagnosis Date  . Diabetes mellitus without complication   . Hyperlipidemia   . Hypertension     No past surgical history on file.  History   Social History  . Marital Status: Married    Spouse Name: [place] Michelle Swofford    Number of Children: 4  . Years of Education: 12   Occupational History  . 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 Smoking status: Never Smoker   . Smokeless tobacco: Never Used  . Alcohol Use: No     Comment: former alcohol use/ not currently  . Drug Use: No  . Sexual Activity: Yes    Partners: Female   Other Topics Concern  . Not on file   Social History Narrative   [employer]   Lives with his wife.   Regular exercise: occasionally   Caffeine use: coffee daily    Current Outpatient Prescriptions on File Prior to Visit  Medication Sig Dispense Refill  . Canagliflozin (INVOKANA) 300 MG TABS Take 1 tablet (300 mg total) by mouth daily.  30 tablet  11  . glipiZIDE (GLUCOTROL) 5 MG tablet Take 1 tablet (5 mg total) by mouth daily before breakfast. Need to make sure you eat with this.  90 tablet  1  . glucose blood (ONE TOUCH ULTRA TEST) test strip 1 each by Other route daily. And lancets 1/day 250.02  100 each  12  . ketoprofen (ORUDIS) 50 MG capsule Take 1 capsule (50 mg total) by mouth 4 (four) times daily as needed.  30 capsule  3  . losartan (COZAAR) 100 MG tablet Take 1 tablet (100 mg total) by mouth daily.  90 tablet  1  . metFORMIN (GLUCOPHAGE) 500 MG  tablet TAKE 2 TABLETS BY MOUTH 2 TIMES DAILY WITH A MEAL.  360 tablet  1  . saxagliptin HCl (ONGLYZA) 5 MG TABS tablet Take 1 tablet (5 mg total) by mouth daily.  30 tablet  1  . simvastatin (ZOCOR) 20 MG tablet Take 1 tablet (20 mg total) by mouth every evening.  90 tablet  1   No current facility-administered medications on file prior to visit.    No Known Allergies  Family History  Problem Relation Age of Onset  . Diabetes Mother   . Hyperlipidemia Mother   . Hypertension Mother   . Diabetes Father   . Cancer Sister [id number]    uterus?  . Diabetes Sister   . Hypertension Brother   . Diabetes Sister   . Diabetes Sister   . Diabetes Sister   . Diabetes Brother   . Hyperlipidemia Brother   . Hypertension Brother     BP 118/70  Pulse 73  Temp(Src) 98.1 F (36.7 C) (Oral)  Ht 5' 6.5" (1.689 m)  Wt 183 lb (83.008 kg)  BMI 29.10 kg/m2  SpO2 96%  Review of Systems Denies decreased urinary stream and hypoglycemia.  Objective:   Physical Exam VITAL SIGNS:  See vs page GENERAL: no distress. GENITALIA:  Normal male testicles, scrotum, and penis.    Lab Results  Component Value Date   HGBA1C 6.0 08/21/2013   Lab Results  Component Value Date   TESTOSTERONE 452.42 03/28/2012      Assessment & Plan:  ED: new to me. DM: overcontrolled, given this sulfonylurea-containing regimen.  However, we'll continue the same for now, given that he says he does not have hypoglycemia.    Patient Instructions  check your blood sugar once a day.  vary the time of day when you check, between before the 3 meals, and at bedtime.  also check if you have symptoms of your blood sugar being too high or too low.  please keep a record of the readings and bring it to your next appointment here.  You can write it on any piece of paper.  please call us sooner if your blood sugar goes below 70, or if you have a lot of readings over 200.   i have sent a prescription to your pharmacy, for the ED  medication.    Please come back for a follow-up appointment in 2 months.

## 2013-11-03 ENCOUNTER — Other Ambulatory Visit: Payer: Self-pay | Admitting: Physician Assistant

## 2013-11-28 ENCOUNTER — Ambulatory Visit: Payer: 59 | Admitting: Endocrinology

## 2013-11-28 DIAGNOSIS — Z0289 Encounter for other administrative examinations: Secondary | ICD-10-CM

## 2013-12-04 ENCOUNTER — Ambulatory Visit (INDEPENDENT_AMBULATORY_CARE_PROVIDER_SITE_OTHER): Payer: 59 | Admitting: Family Medicine

## 2013-12-04 VITALS — BP 136/84 | HR 88 | Temp 98.2°F | Resp 16 | Ht 66.5 in | Wt 180.5 lb

## 2013-12-04 DIAGNOSIS — E119 Type 2 diabetes mellitus without complications: Secondary | ICD-10-CM

## 2013-12-04 DIAGNOSIS — E7800 Pure hypercholesterolemia, unspecified: Secondary | ICD-10-CM

## 2013-12-04 DIAGNOSIS — Z1211 Encounter for screening for malignant neoplasm of colon: Secondary | ICD-10-CM

## 2013-12-04 DIAGNOSIS — I10 Essential (primary) hypertension: Secondary | ICD-10-CM

## 2013-12-06 MED ORDER — LOSARTAN POTASSIUM 100 MG PO TABS: 100.0000 mg | ORAL_TABLET | Freq: Every day | ORAL | Status: DC

## 2013-12-06 MED ORDER — CANAGLIFLOZIN 300 MG PO TABS: 1.0000 | ORAL_TABLET | Freq: Every day | ORAL | Status: DC

## 2013-12-06 MED ORDER — METFORMIN HCL 500 MG PO TABS: ORAL_TABLET | ORAL | Status: DC

## 2013-12-06 MED ORDER — SIMVASTATIN 20 MG PO TABS: 20.0000 mg | ORAL_TABLET | Freq: Every evening | ORAL | Status: DC

## 2013-12-06 MED ORDER — GLIPIZIDE 5 MG PO TABS: 5.0000 mg | ORAL_TABLET | Freq: Every day | ORAL | Status: DC

## 2013-12-06 NOTE — Progress Notes (Signed)
   Subjective:    Patient ID: Ricardo Mills, male    DOB: June 02, 1955, 58 y.o.   MRN: [id number]  HPI This is a very pleasant 58 yo male who is accompanied by his wife who is also being seen as a patient this evening. The patient is requesting medication refills. He reports that he is doing well on his current medications for HTN, DM without side effects. He does not check his blood sugar at home. He and his wife are watching their diets and he has lost 3 pounds in the last 2 months. His labs from 4/15 are normal.   He sees his urologist for refills and samples of his ED medications.   He has Dr. Conley RollsLe listed as his PCP and has been meaning to make an appointment with her for a CPE. He is aware he needs a screening colonoscopy and is willing to be referred for this today.    Review of Systems No fever/chills, no chest pain, no SOB    Objective:   Physical Exam  Vitals reviewed. Constitutional: He is oriented to person, place, and time. He appears well-developed and well-nourished.  Eyes: Conjunctivae are normal.  Neck: Normal range of motion. Neck supple.  Cardiovascular: Normal rate, regular rhythm and normal heart sounds.   Pulmonary/Chest: Effort normal and breath sounds normal.  Musculoskeletal: Normal range of motion.  Neurological: He is alert and oriented to person, place, and time.  Skin: Skin is warm and dry.  Psychiatric: He has a normal mood and affect. His behavior is normal. Judgment and thought content normal.      Assessment & Plan:  1. Essential hypertension - losartan (COZAAR) 100 MG tablet; Take 1 tablet (100 mg total) by mouth daily.  Dispense: 90 tablet; Refill: 1  2. High cholesterol - simvastatin (ZOCOR) 20 MG tablet; Take 1 tablet (20 mg total) by mouth every evening.  Dispense: 90 tablet; Refill: 1  3. Type 2 diabetes mellitus without complication - Canagliflozin (INVOKANA) 300 MG TABS; Take 1 tablet (300 mg total) by mouth daily.  Dispense: 90 tablet; Refill:  1 - glipiZIDE (GLUCOTROL) 5 MG tablet; Take 1 tablet (5 mg total) by mouth daily before breakfast. Need to make sure you eat with this.  Dispense: 90 tablet; Refill: 1 - metFORMIN (GLUCOPHAGE) 500 MG tablet; TAKE 2 TABLETS BY MOUTH 2 TIMES DAILY WITH A MEAL.  Dispense: 360 tablet; Refill: 1  4. Colon cancer screening - Ambulatory referral to Gastroenterology  -Will have scheduling at 104 call to make an appointment for CPE with Dr. Conley RollsLe.  Emi Belfasteborah B. Shady Bradish, FNP-BC  [facility], [facility]  12/06/2013 7:09 PM

## 2013-12-07 NOTE — Progress Notes (Signed)
Per Eunice Blaseebbie, called patient to schedule an appt with Dr. Conley RollsLe for 3 months.  He said he didn't want to do that at this time. He would call back at a later date.

## 2014-01-25 ENCOUNTER — Ambulatory Visit (AMBULATORY_SURGERY_CENTER): Payer: Self-pay | Admitting: *Deleted

## 2014-01-25 VITALS — Ht 66.5 in | Wt 181.4 lb

## 2014-01-25 DIAGNOSIS — Z1211 Encounter for screening for malignant neoplasm of colon: Secondary | ICD-10-CM

## 2014-01-25 MED ORDER — MOVIPREP 100 G PO SOLR: 1.0000 | Freq: Once | ORAL | Status: DC

## 2014-01-25 NOTE — Progress Notes (Signed)
No egg or soy allergy. ewm No home 02 use. ewm No problems with past sedation. emw No diet pills. ewm No emmi video per pt. ewm

## 2014-02-08 ENCOUNTER — Ambulatory Visit (AMBULATORY_SURGERY_CENTER): Payer: 59 | Admitting: Gastroenterology

## 2014-02-08 ENCOUNTER — Encounter: Payer: Self-pay | Admitting: Gastroenterology

## 2014-02-08 VITALS — BP 153/54 | HR 65 | Temp 96.7°F | Resp 16 | Ht 66.5 in | Wt 181.0 lb

## 2014-02-08 DIAGNOSIS — Z1211 Encounter for screening for malignant neoplasm of colon: Secondary | ICD-10-CM

## 2014-02-08 LAB — GLUCOSE, CAPILLARY
GLUCOSE-CAPILLARY: 75 mg/dL (ref 70–99)
Glucose-Capillary: 90 mg/dL (ref 70–99)

## 2014-02-08 MED ORDER — SODIUM CHLORIDE 0.9 % IV SOLN: 500.0000 mL | INTRAVENOUS | Status: DC

## 2014-02-08 NOTE — Progress Notes (Signed)
Report to PACU, RN, vss, BBS= Clear.  

## 2014-02-08 NOTE — Op Note (Signed)
[facility] [address] [place], [id number]   COLONOSCOPY PROCEDURE REPORT  PATIENT: Foye DeerFaison, Ricardo A  MR#: [id number] BIRTHDATE: Jan 23, 1956 , 58  yrs. old GENDER: male ENDOSCOPIST: Meryl DareMalcolm T Karilynn Carranza, MD, [facility] REFERRED BY: Olean Reeeborah Gessner, FNP PROCEDURE DATE:  02/08/2014 PROCEDURE:   Colonoscopy, screening First Screening Colonoscopy - Avg.  risk and is 50 yrs.  old or older Yes.  Prior Negative Screening - Now for repeat screening. N/A  History of Adenoma - Now for follow-up colonoscopy & has been > or = to 3 yrs.  N/A  Polyps Removed Today? No.  Polyps Removed Today? No.  Recommend repeat exam, <10 yrs? Polyps Removed Today? No.  Recommend repeat exam, <10 yrs? No. ASA CLASS:   Class II INDICATIONS:average risk for colorectal cancer. MEDICATIONS: Monitored anesthesia care and Propofol 240 mg IV DESCRIPTION OF PROCEDURE:   After the risks benefits and alternatives of the procedure were thoroughly explained, informed consent was obtained.  The digital rectal exam revealed no abnormalities of the rectum.   The LB [id number]  endoscope was introduced through the anus and advanced to the cecum, which was identified by both the appendix and ileocecal valve. No adverse events experienced.   The quality of the prep was good, using MoviPrep  The instrument was then slowly withdrawn as the colon was fully examined.  COLON FINDINGS: A normal appearing cecum, ileocecal valve, and appendiceal orifice were identified.  The ascending, transverse, descending, sigmoid colon, and rectum appeared unremarkable. Retroflexed views revealed internal Grade I hemorrhoids. The time to cecum=2 minutes 02 seconds.  Withdrawal time=11 minutes 57 seconds.  The scope was withdrawn and the procedure completed.  COMPLICATIONS: There were no immediate complications.  ENDOSCOPIC IMPRESSION: 1.  Normal colonoscopy 2.  Grade I internal hemorrhoids  RECOMMENDATIONS: 1.  Continue to  follow colorectal cancer screening guidelines for "routine risk" patients with a repeat colonoscopy in 10 years. There is no need for routine, screening FOBT (stool) testing for at least 5 years.  eSigned:  Meryl DareMalcolm T Kilynn Fitzsimmons, MD, [facility] 02/08/2014 11:15 AM

## 2014-02-08 NOTE — Patient Instructions (Signed)
discharge instructions given with verbal understanding. Handouts on hemorrhoids and a high fiber diet. Resume previous medications. YOU HAD AN ENDOSCOPIC PROCEDURE TODAY AT THE [facility]: Refer to the procedure report that was given to you for any specific questions about what was found during the examination.  If the procedure report does not answer your questions, please call your gastroenterologist to clarify.  If you requested that your care partner not be given the details of your procedure findings, then the procedure report has been included in a sealed envelope for you to review at your convenience later.  YOU SHOULD EXPECT: Some feelings of bloating in the abdomen. Passage of more gas than usual.  Walking can help get rid of the air that was put into your GI tract during the procedure and reduce the bloating. If you had a lower endoscopy (such as a colonoscopy or flexible sigmoidoscopy) you may notice spotting of blood in your stool or on the toilet paper. If you underwent a bowel prep for your procedure, then you may not have a normal bowel movement for a few days.  DIET: Your first meal following the procedure should be a light meal and then it is ok to progress to your normal diet.  A half-sandwich or bowl of soup is an example of a good first meal.  Heavy or fried foods are harder to digest and may make you feel nauseous or bloated.  Likewise meals heavy in dairy and vegetables can cause extra gas to form and this can also increase the bloating.  Drink plenty of fluids but you should avoid alcoholic beverages for 24 hours.  ACTIVITY: Your care partner should take you home directly after the procedure.  You should plan to take it easy, moving slowly for the rest of the day.  You can resume normal activity the day after the procedure however you should NOT DRIVE or use heavy machinery for 24 hours (because of the sedation medicines used during the test).    SYMPTOMS TO REPORT  IMMEDIATELY: A gastroenterologist can be reached at any hour.  During normal business hours, 8:30 AM to 5:00 PM Monday through Friday, call [phone].  After hours and on weekends, please call the GI answering service at [phone] who will take a message and have the physician on call contact you.   Following lower endoscopy (colonoscopy or flexible sigmoidoscopy):  Excessive amounts of blood in the stool  Significant tenderness or worsening of abdominal pains  Swelling of the abdomen that is new, acute  Fever of 100F or higher  FOLLOW UP: If any biopsies were taken you will be contacted by phone or by letter within the next 1-3 weeks.  Call your gastroenterologist if you have not heard about the biopsies in 3 weeks.  Our staff will call the home number listed on your records the next business day following your procedure to check on you and address any questions or concerns that you may have at that time regarding the information given to you following your procedure. This is a courtesy call and so if there is no answer at the home number and we have not heard from you through the emergency physician on call, we will assume that you have returned to your regular daily activities without incident.  SIGNATURES/CONFIDENTIALITY: You and/or your care partner have signed paperwork which will be entered into your electronic medical record.  These signatures attest to the fact that that the information above on your  After Visit Summary has been reviewed and is understood.  Full responsibility of the confidentiality of this discharge information lies with you and/or your care-partner.

## 2014-02-09 ENCOUNTER — Telehealth: Payer: Self-pay

## 2014-02-09 NOTE — Telephone Encounter (Signed)
  Follow up Call-  Call back number 02/08/2014  Post procedure Call Back phone  # [phone]  Permission to leave phone message Yes     Patient questions:  Do you have a fever, pain , or abdominal swelling? No. Pain Score  0 *  Have you tolerated food without any problems? Yes.    Have you been able to return to your normal activities? Yes.    Do you have any questions about your discharge instructions: Diet   No. Medications  No. Follow up visit  No.  Do you have questions or concerns about your Care? No.  Actions: * If pain score is 4 or above: No action needed, pain <4.

## 2014-03-25 ENCOUNTER — Ambulatory Visit (INDEPENDENT_AMBULATORY_CARE_PROVIDER_SITE_OTHER): Payer: 59 | Admitting: Internal Medicine

## 2014-03-25 VITALS — BP 122/78 | HR 82 | Temp 98.0°F | Resp 16 | Ht 66.0 in | Wt 179.0 lb

## 2014-03-25 DIAGNOSIS — I10 Essential (primary) hypertension: Secondary | ICD-10-CM

## 2014-03-25 DIAGNOSIS — E7800 Pure hypercholesterolemia, unspecified: Secondary | ICD-10-CM

## 2014-03-25 DIAGNOSIS — E119 Type 2 diabetes mellitus without complications: Secondary | ICD-10-CM

## 2014-03-25 LAB — COMPREHENSIVE METABOLIC PANEL
ALT: 17 U/L (ref 0–53)
AST: 14 U/L (ref 0–37)
Albumin: 4.8 g/dL (ref 3.5–5.2)
Alkaline Phosphatase: 69 U/L (ref 39–117)
BUN: 15 mg/dL (ref 6–23)
CALCIUM: 9.8 mg/dL (ref 8.4–10.5)
CHLORIDE: 103 meq/L (ref 96–112)
CO2: 24 mEq/L (ref 19–32)
CREATININE: 0.9 mg/dL (ref 0.50–1.35)
Glucose, Bld: 90 mg/dL (ref 70–99)
POTASSIUM: 4.2 meq/L (ref 3.5–5.3)
SODIUM: 138 meq/L (ref 135–145)
TOTAL PROTEIN: 7.8 g/dL (ref 6.0–8.3)
Total Bilirubin: 0.6 mg/dL (ref 0.2–1.2)

## 2014-03-25 LAB — LIPID PANEL
CHOL/HDL RATIO: 2.4 ratio
Cholesterol: 127 mg/dL (ref 0–200)
HDL: 53 mg/dL (ref >39)
LDL Cholesterol: 64 mg/dL (ref 0–99)
Triglycerides: 50 mg/dL (ref <150)
VLDL: 10 mg/dL (ref 0–40)

## 2014-03-25 LAB — CBC WITH DIFFERENTIAL/PLATELET
BASOS ABS: 0.1 10*3/uL (ref 0.0–0.1)
Basophils Relative: 2 % — ABNORMAL HIGH (ref 0–1)
EOS PCT: 6 % — AB (ref 0–5)
Eosinophils Absolute: 0.3 10*3/uL (ref 0.0–0.7)
HCT: 47.6 % (ref 39.0–52.0)
Hemoglobin: 16.1 g/dL (ref 13.0–17.0)
LYMPHS ABS: 1.6 10*3/uL (ref 0.7–4.0)
Lymphocytes Relative: 35 % (ref 12–46)
MCH: 25.9 pg — ABNORMAL LOW (ref 26.0–34.0)
MCHC: 33.8 g/dL (ref 30.0–36.0)
MCV: 76.7 fL — ABNORMAL LOW (ref 78.0–100.0)
Monocytes Absolute: 0.4 10*3/uL (ref 0.1–1.0)
Monocytes Relative: 9 % (ref 3–12)
NEUTROS ABS: 2.3 10*3/uL (ref 1.7–7.7)
NEUTROS PCT: 48 % (ref 43–77)
PLATELETS: 169 10*3/uL (ref 150–400)
RBC: 6.21 MIL/uL — AB (ref 4.22–5.81)
RDW: 15.8 % — ABNORMAL HIGH (ref 11.5–15.5)
WBC: 4.7 10*3/uL (ref 4.0–10.5)

## 2014-03-25 LAB — POCT GLYCOSYLATED HEMOGLOBIN (HGB A1C): Hemoglobin A1C: 7.6

## 2014-03-25 MED ORDER — LOSARTAN POTASSIUM 100 MG PO TABS: 100.0000 mg | ORAL_TABLET | Freq: Every day | ORAL | Status: DC

## 2014-03-25 MED ORDER — METFORMIN HCL 500 MG PO TABS: ORAL_TABLET | ORAL | Status: DC

## 2014-03-25 MED ORDER — SIMVASTATIN 20 MG PO TABS: 20.0000 mg | ORAL_TABLET | Freq: Every evening | ORAL | Status: DC

## 2014-03-25 MED ORDER — GLIPIZIDE 5 MG PO TABS: 5.0000 mg | ORAL_TABLET | Freq: Every day | ORAL | Status: DC

## 2014-03-25 MED ORDER — CANAGLIFLOZIN 300 MG PO TABS: 300.0000 mg | ORAL_TABLET | Freq: Every day | ORAL | Status: DC

## 2014-03-25 NOTE — Progress Notes (Signed)
Subjective:    Patient ID: Ricardo Mills, male    DOB: May 25, 1955, 58 y.o.   MRN: [id number] This chart was scribed for Tami Lin, MD by Marti Sleigh, Medical Scribe. This patient was seen in Room 5 and the patient's care was started a 12:45 PM.   HPI HPI Comments: PRESTYN STANCO is a 58 y.o. male who presents to [facility] needing a medication refill. Pt had a recent colonoscopy which was normal. Pt states his DM was poorly managed until he began his Invokana medication, and is now being well managed. Pt denies swelling or numbness in legs or feet.  Patient Active Problem List   Diagnosis Date Noted  . Halitosis 03/28/2012  . Type II or unspecified type diabetes mellitus without mention of complication, uncontrolled 06/21/2011  . HTN (hypertension) 06/21/2011  . Hyperlipidemia LDL goal <70 06/21/2011  . ED (erectile dysfunction) 06/21/2011   Prior to Admission medications   Medication Sig Start Date End Date Taking? Authorizing Provider  canagliflozin (INVOKANA) 300 MG TABS tablet Take 300 mg by mouth daily.   Yes   glipiZIDE (GLUCOTROL) 5 MG tablet Take 1 tablet (5 mg total) by mouth daily before breakfast. Need to make sure you eat with this. 12/06/13  Yes Elby Beck, FNP  glucose blood (ONE TOUCH ULTRA TEST) test strip 1 each by Other route daily. And lancets 1/day 250.02 03/09/13  Yes Renato Shin, MD  losartan (COZAAR) 100 MG tablet    Yes   metFORMIN (GLUCOPHAGE) 500 MG tablet TAKE 2 TABLETS BY MOUTH 2 TIMES DAILY WITH A MEAL.   Yes   sildenafil (REVATIO) 20 MG tablet 2-5 tabs as needed for ED symptoms.  This is a correction of rx just sent 09/28/13  Yes Renato Shin, MD  simvastatin (ZOCOR) 20 MG tablet Take 1 tablet (20 mg total) by mouth every evening.   Yes   Has improved w/ invokana and exercise//less vigilant lately   Review of Systems  Constitutional: Negative for fever, chills, activity change, appetite change and unexpected weight change.  HENT: Negative for  congestion.   Eyes: Negative for visual disturbance.  Respiratory: Negative for chest tightness and shortness of breath.   Cardiovascular: Negative for chest pain, palpitations and leg swelling.  Gastrointestinal: Negative for nausea, vomiting and abdominal pain.  Endocrine: Negative for polydipsia, polyphagia and polyuria.  Genitourinary: Negative for difficulty urinating.  Skin: Negative for wound.  Neurological: Negative for headaches.  Psychiatric/Behavioral: Negative for sleep disturbance, dysphoric mood and decreased concentration.       Objective:   Physical Exam  Constitutional: He is oriented to person, place, and time. He appears well-developed and well-nourished.  HENT:  Head: Normocephalic and atraumatic.  Eyes: Conjunctivae and EOM are normal. Pupils are equal, round, and reactive to light.  Neck: Neck supple. No thyromegaly present.  No carotid bruit  Cardiovascular: Normal rate, regular rhythm, normal heart sounds and intact distal pulses.   No murmur heard. Pulmonary/Chest: Effort normal and breath sounds normal. No respiratory distress. He has no wheezes.  Musculoskeletal: He exhibits no edema.  Lymphadenopathy:    He has no cervical adenopathy.  Neurological: He is alert and oriented to person, place, and time. No cranial nerve deficit.  Skin: Skin is warm and dry.  Psychiatric: He has a normal mood and affect. His behavior is normal. Thought content normal.  Nursing note and vitals reviewed.  Results for orders placed or performed in visit on 03/25/14  CBC with Differential  Result Value Ref Range   WBC 4.7 4.0 - 10.5 K/uL   RBC 6.21 (H) 4.22 - 5.81 MIL/uL   Hemoglobin 16.1 13.0 - 17.0 g/dL   HCT 47.6 39.0 - 52.0 %   MCV 76.7 (L) 78.0 - 100.0 fL   MCH 25.9 (L) 26.0 - 34.0 pg   MCHC 33.8 30.0 - 36.0 g/dL   RDW 15.8 (H) 11.5 - 15.5 %   Platelets 169 150 - 400 K/uL   Neutrophils Relative % 48 43 - 77 %   Neutro Abs 2.3 1.7 - 7.7 K/uL   Lymphocytes  Relative 35 12 - 46 %   Lymphs Abs 1.6 0.7 - 4.0 K/uL   Monocytes Relative 9 3 - 12 %   Monocytes Absolute 0.4 0.1 - 1.0 K/uL   Eosinophils Relative 6 (H) 0 - 5 %   Eosinophils Absolute 0.3 0.0 - 0.7 K/uL   Basophils Relative 2 (H) 0 - 1 %   Basophils Absolute 0.1 0.0 - 0.1 K/uL   Smear Review Criteria for review not met   Comprehensive metabolic panel  Result Value Ref Range   Sodium 138 135 - 145 mEq/L   Potassium 4.2 3.5 - 5.3 mEq/L   Chloride 103 96 - 112 mEq/L   CO2 24 19 - 32 mEq/L   Glucose, Bld 90 70 - 99 mg/dL   BUN 15 6 - 23 mg/dL   Creat 0.90 0.50 - 1.35 mg/dL   Total Bilirubin 0.6 0.2 - 1.2 mg/dL   Alkaline Phosphatase 69 39 - 117 U/L   AST 14 0 - 37 U/L   ALT 17 0 - 53 U/L   Total Protein 7.8 6.0 - 8.3 g/dL   Albumin 4.8 3.5 - 5.2 g/dL   Calcium 9.8 8.4 - 10.5 mg/dL  Lipid panel  Result Value Ref Range   Cholesterol 127 0 - 200 mg/dL   Triglycerides 50 <150 mg/dL   HDL 53 >39 mg/dL   Total CHOL/HDL Ratio 2.4 Ratio   VLDL 10 0 - 40 mg/dL   LDL Cholesterol 64 0 - 99 mg/dL  POCT glycosylated hemoglobin (Hb A1C)  Result Value Ref Range   Hemoglobin A1C 7.6        Assessment & Plan:   I have completed the patient encounter in its entirety as documented by the scribe, with editing by me where necessary. Acea Yagi P. Laney Pastor, M.D.   Essential hypertension --no change meds  Type 2 diabetes mellitus without complication - no change meds//incr exercise w/ wt loss in effort to get off glipizide  High cholesterol - responding well to simvastatin (ZOCOR) 20 MG tablet   Meds ordered this encounter  Medications  . losartan (COZAAR) 100 MG tablet    Sig: Take 1 tablet (100 mg total) by mouth daily.    Dispense:  90 tablet    Refill:  1  . metFORMIN (GLUCOPHAGE) 500 MG tablet    Sig: TAKE 2 TABLETS BY MOUTH 2 TIMES DAILY WITH A MEAL.    Dispense:  360 tablet    Refill:  1  . canagliflozin (INVOKANA) 300 MG TABS tablet    Sig: Take 300 mg by mouth daily.     Dispense:  90 tablet    Refill:  1  . simvastatin (ZOCOR) 20 MG tablet    Sig: Take 1 tablet (20 mg total) by mouth every evening.    Dispense:  90 tablet    Refill:  1  . glipiZIDE (GLUCOTROL) 5  MG tablet    Sig: Take 1 tablet (5 mg total) by mouth daily before breakfast. Need to make sure you eat with this.    Dispense:  90 tablet    Refill:  1   F/u 6 mo by appt!!!!

## 2014-03-29 ENCOUNTER — Encounter: Payer: Self-pay | Admitting: Internal Medicine

## 2014-06-25 ENCOUNTER — Ambulatory Visit (INDEPENDENT_AMBULATORY_CARE_PROVIDER_SITE_OTHER): Payer: 59 | Admitting: Internal Medicine

## 2014-06-25 ENCOUNTER — Ambulatory Visit (INDEPENDENT_AMBULATORY_CARE_PROVIDER_SITE_OTHER): Payer: 59

## 2014-06-25 VITALS — BP 154/86 | HR 96 | Temp 98.1°F | Resp 16 | Ht 66.0 in | Wt 179.0 lb

## 2014-06-25 DIAGNOSIS — M79642 Pain in left hand: Secondary | ICD-10-CM

## 2014-06-25 DIAGNOSIS — M7989 Other specified soft tissue disorders: Secondary | ICD-10-CM

## 2014-06-25 DIAGNOSIS — L03114 Cellulitis of left upper limb: Secondary | ICD-10-CM

## 2014-06-25 DIAGNOSIS — E119 Type 2 diabetes mellitus without complications: Secondary | ICD-10-CM

## 2014-06-25 LAB — POCT CBC
GRANULOCYTE PERCENT: 75 % (ref 37–80)
HCT, POC: 49.2 % (ref 43.5–53.7)
HEMOGLOBIN: 16.1 g/dL (ref 14.1–18.1)
LYMPH, POC: 2.1 (ref 0.6–3.4)
MCH, POC: 26.7 pg — AB (ref 27–31.2)
MCHC: 32.8 g/dL (ref 31.8–35.4)
MCV: 81.2 fL (ref 80–97)
MID (CBC): 0.4 (ref 0–0.9)
MPV: 10 fL (ref 0–99.8)
POC Granulocyte: 7.4 — AB (ref 2–6.9)
POC LYMPH PERCENT: 21.3 %L (ref 10–50)
POC MID %: 3.7 %M (ref 0–12)
Platelet Count, POC: 162 10*3/uL (ref 142–424)
RBC: 6.06 M/uL (ref 4.69–6.13)
RDW, POC: 14.6 %
WBC: 9.8 10*3/uL (ref 4.6–10.2)

## 2014-06-25 LAB — GLUCOSE, POCT (MANUAL RESULT ENTRY): POC GLUCOSE: 142 mg/dL — AB (ref 70–99)

## 2014-06-25 LAB — POCT GLYCOSYLATED HEMOGLOBIN (HGB A1C): HEMOGLOBIN A1C: 7.2

## 2014-06-25 MED ORDER — HYDROCODONE-ACETAMINOPHEN 5-325 MG PO TABS: 1.0000 | ORAL_TABLET | Freq: Four times a day (QID) | ORAL | Status: DC | PRN

## 2014-06-25 MED ORDER — DOXYCYCLINE HYCLATE 100 MG PO TABS: 100.0000 mg | ORAL_TABLET | Freq: Two times a day (BID) | ORAL | Status: DC

## 2014-06-25 MED ORDER — CEFTRIAXONE SODIUM 1 G IJ SOLR
1.0000 g | Freq: Once | INTRAMUSCULAR | Status: AC
  Administered 2014-06-25: 1 g via INTRAMUSCULAR

## 2014-06-25 MED ORDER — INDOMETHACIN 50 MG PO CAPS: 50.0000 mg | ORAL_CAPSULE | Freq: Three times a day (TID) | ORAL | Status: DC

## 2014-06-25 NOTE — Patient Instructions (Signed)
Gout Gout is an inflammatory arthritis caused by a buildup of uric acid crystals in the joints. Uric acid is a chemical that is normally present in the blood. When the level of uric acid in the blood is too high it can form crystals that deposit in your joints and tissues. This causes joint redness, soreness, and swelling (inflammation). Repeat attacks are common. Over time, uric acid crystals can form into masses (tophi) near a joint, destroying bone and causing disfigurement. Gout is treatable and often preventable. CAUSES  The disease begins with elevated levels of uric acid in the blood. Uric acid is produced by your body when it breaks down a naturally found substance called purines. Certain foods you eat, such as meats and fish, contain high amounts of purines. Causes of an elevated uric acid level include:  Being passed down from parent to child (heredity).  Diseases that cause increased uric acid production (such as obesity, psoriasis, and certain cancers).  Excessive alcohol use.  Diet, especially diets rich in meat and seafood.  Medicines, including certain cancer-fighting medicines (chemotherapy), water pills (diuretics), and aspirin.  Chronic kidney disease. The kidneys are no longer able to remove uric acid well.  Problems with metabolism. Conditions strongly associated with gout include:  Obesity.  High blood pressure.  High cholesterol.  Diabetes. Not everyone with elevated uric acid levels gets gout. It is not understood why some people get gout and others do not. Surgery, joint injury, and eating too much of certain foods are some of the factors that can lead to gout attacks. SYMPTOMS   An attack of gout comes on quickly. It causes intense pain with redness, swelling, and warmth in a joint.  Fever can occur.  Often, only one joint is involved. Certain joints are more commonly involved:  Base of the big toe.  Knee.  Ankle.  Wrist.  Finger. Without  treatment, an attack usually goes away in a few days to weeks. Between attacks, you usually will not have symptoms, which is different from many other forms of arthritis. DIAGNOSIS  Your caregiver will suspect gout based on your symptoms and exam. In some cases, tests may be recommended. The tests may include:  Blood tests.  Urine tests.  X-rays.  Joint fluid exam. This exam requires a needle to remove fluid from the joint (arthrocentesis). Using a microscope, gout is confirmed when uric acid crystals are seen in the joint fluid. TREATMENT  There are two phases to gout treatment: treating the sudden onset (acute) attack and preventing attacks (prophylaxis).  Treatment of an Acute Attack.  Medicines are used. These include anti-inflammatory medicines or steroid medicines.  An injection of steroid medicine into the affected joint is sometimes necessary.  The painful joint is rested. Movement can worsen the arthritis.  You may use warm or cold treatments on painful joints, depending which works best for you.  Treatment to Prevent Attacks.  If you suffer from frequent gout attacks, your caregiver may advise preventive medicine. These medicines are started after the acute attack subsides. These medicines either help your kidneys eliminate uric acid from your body or decrease your uric acid production. You may need to stay on these medicines for a very long time.  The early phase of treatment with preventive medicine can be associated with an increase in acute gout attacks. For this reason, during the first few months of treatment, your caregiver may also advise you to take medicines usually used for acute gout treatment. Be sure you   understand your caregiver's directions. Your caregiver may make several adjustments to your medicine dose before these medicines are effective.  Discuss dietary treatment with your caregiver or dietitian. Alcohol and drinks high in sugar and fructose and foods  such as meat, poultry, and seafood can increase uric acid levels. Your caregiver or dietitian can advise you on drinks and foods that should be limited. HOME CARE INSTRUCTIONS   Do not take aspirin to relieve pain. This raises uric acid levels.  Only take over-the-counter or prescription medicines for pain, discomfort, or fever as directed by your caregiver.  Rest the joint as much as possible. When in bed, keep sheets and blankets off painful areas.  Keep the affected joint raised (elevated).  Apply warm or cold treatments to painful joints. Use of warm or cold treatments depends on which works best for you.  Use crutches if the painful joint is in your leg.  Drink enough fluids to keep your urine clear or pale yellow. This helps your body get rid of uric acid. Limit alcohol, sugary drinks, and fructose drinks.  Follow your dietary instructions. Pay careful attention to the amount of protein you eat. Your daily diet should emphasize fruits, vegetables, whole grains, and fat-free or low-fat milk products. Discuss the use of coffee, vitamin C, and cherries with your caregiver or dietitian. These may be helpful in lowering uric acid levels.  Maintain a healthy body weight. SEEK MEDICAL CARE IF:   You develop diarrhea, vomiting, or any side effects from medicines.  You do not feel better in 24 hours, or you are getting worse. SEEK IMMEDIATE MEDICAL CARE IF:   Your joint becomes suddenly more tender, and you have chills or a fever. MAKE SURE YOU:   Understand these instructions.  Will watch your condition.  Will get help right away if you are not doing well or get worse. Document Released: 04/24/2000 Document Revised: 09/11/2013 Document Reviewed: 12/09/2011 [facility] CenterExitCare Patient Information 2015 [place]Care, [place]LL[place]. This information is not intended to replace advice given to you by your health care provider. Make sure you discuss any questions you have with your health care  provider. Cellulitis Cellulitis is an infection of the skin and the tissue beneath it. The infected area is usually red and tender. Cellulitis occurs most often in the arms and lower legs.  CAUSES  Cellulitis is caused by bacteria that enter the skin through cracks or cuts in the skin. The most common types of bacteria that cause cellulitis are staphylococci and streptococci. SIGNS AND SYMPTOMS   Redness and warmth.  Swelling.  Tenderness or pain.  Fever. DIAGNOSIS  Your health care provider can usually determine what is wrong based on a physical exam. Blood tests may also be done. TREATMENT  Treatment usually involves taking an antibiotic medicine. HOME CARE INSTRUCTIONS   Take your antibiotic medicine as directed by your health care provider. Finish the antibiotic even if you start to feel better.  Keep the infected arm or leg elevated to reduce swelling.  Apply a warm cloth to the affected area up to 4 times per day to relieve pain.  Take medicines only as directed by your health care provider.  Keep all follow-up visits as directed by your health care provider. SEEK MEDICAL CARE IF:   You notice red streaks coming from the infected area.  Your red area gets larger or turns dark in color.  Your bone or joint underneath the infected area becomes painful after the skin has healed.  Your  Your infection returns in the same area or another area. °· You notice a swollen bump in the infected area. °· You develop new symptoms. °· You have a fever. °SEEK IMMEDIATE MEDICAL CARE IF:  °· You feel very sleepy. °· You develop vomiting or diarrhea. °· You have a general ill feeling (malaise) with muscle aches and pains. °MAKE SURE YOU:  °· Understand these instructions. °· Will watch your condition. °· Will get help right away if you are not doing well or get worse. °Document Released: 02/04/2005 Document Revised: 09/11/2013 Document Reviewed: 07/13/2011 °ExitCare® Patient Information ©2015  [employer]. This information is not intended to replace advice given to you by your health care provider. Make sure you discuss any questions you have with your health care provider. ° °

## 2014-06-25 NOTE — Progress Notes (Signed)
   Subjective:    Patient ID: Ricardo Mills, male    DOB: 04-19-1956, 59 y.o.   MRN: [id number]  HPI Has 3 days of progressive pain, swelling, erythema, and heat to dorsal left hand. No trauma or bites, no entry wound seen. Has controlled T2DM.  Review of Systems     Objective:   Physical Exam  Constitutional: He is oriented to person, place, and time. He appears well-developed and well-nourished. He appears distressed.  HENT:  Head: Normocephalic.  Eyes: EOM are normal. Pupils are equal, round, and reactive to light.  Neck: Normal range of motion.  Pulmonary/Chest: Effort normal.  Musculoskeletal:       Right hand: He exhibits normal range of motion, no tenderness, no bony tenderness, normal two-point discrimination, no deformity and no swelling. Normal sensation noted. Normal strength noted.       Left hand: He exhibits decreased range of motion, tenderness, bony tenderness and swelling. He exhibits normal two-point discrimination, normal capillary refill, no deformity and no laceration. Normal sensation noted. Normal strength noted.       Hands: Tender, warm, swollen  Neurological: He is alert and oriented to person, place, and time. He exhibits normal muscle tone. Coordination normal.  Skin: There is erythema.  Psychiatric: He has a normal mood and affect.   Hx 50 years ago puncture to palm pencil over 3-4 distal mcs.  [facility] reading (PRIMARY) by  Dr.Jmari Pelc.no FB, no fx Results for orders placed or performed in visit on 06/25/14  POCT CBC  Result Value Ref Range   WBC 9.8 4.6 - 10.2 K/uL   Lymph, poc 2.1 0.6 - 3.4   POC LYMPH PERCENT 21.3 10 - 50 %L   MID (cbc) 0.4 0 - 0.9   POC MID % 3.7 0 - 12 %M   POC Granulocyte 7.4 (A) 2 - 6.9   Granulocyte percent 75.0 37 - 80 %G   RBC 6.06 4.69 - 6.13 M/uL   Hemoglobin 16.1 14.1 - 18.1 g/dL   HCT, POC 40.949.2 81.143.5 - 53.7 %   MCV 81.2 80 - 97 fL   MCH, POC 26.7 (A) 27 - 31.2 pg   MCHC 32.8 31.8 - 35.4 g/dL   RDW, POC 91.414.6 %   Platelet Count, POC 162 142 - 424 K/uL   MPV 10.0 0 - 99.8 fL  POCT glycosylated hemoglobin (Hb A1C)  Result Value Ref Range   Hemoglobin A1C 7.2   POCT glucose (manual entry)  Result Value Ref Range   POC Glucose 142 (A) 70 - 99 mg/dl   Uric acid pending      Assessment & Plan:  Cellulitis left hand/Less likely gout Pain hand Rocephin 2g/Doxycycline 100mg  bid Sling/Elevation Vicodin 5/325 prn pain/Indocin 50mg  tid trial RTC 830am

## 2014-06-26 ENCOUNTER — Ambulatory Visit (INDEPENDENT_AMBULATORY_CARE_PROVIDER_SITE_OTHER): Payer: 59 | Admitting: Internal Medicine

## 2014-06-26 VITALS — BP 112/80 | HR 102 | Temp 97.9°F | Resp 16 | Ht 66.0 in | Wt 179.0 lb

## 2014-06-26 DIAGNOSIS — L03114 Cellulitis of left upper limb: Secondary | ICD-10-CM

## 2014-06-26 LAB — URIC ACID: Uric Acid, Serum: 2.6 mg/dL — ABNORMAL LOW (ref 4.0–7.8)

## 2014-06-26 MED ORDER — CEFTRIAXONE SODIUM 1 G IJ SOLR
1.0000 g | Freq: Once | INTRAMUSCULAR | Status: AC
  Administered 2014-06-26: 1 g via INTRAMUSCULAR

## 2014-06-26 NOTE — Progress Notes (Signed)
   Subjective:    Patient ID: Ricardo Mills, male    DOB: 16-Mar-1956, 59 y.o.   MRN: [id number]  HPI Hand improved 25%, able to move fingers today. Probable cellulitis, less likely gout. Tolerates meds..   Review of Systems     Objective:   Physical Exam  Constitutional: He is oriented to person, place, and time. He appears well-developed and well-nourished. No distress.  HENT:  Head: Normocephalic.  Eyes: EOM are normal. No scleral icterus.  Neck: Normal range of motion.  Pulmonary/Chest: Effort normal.  Musculoskeletal: He exhibits edema and tenderness.  Neurological: He is alert and oriented to person, place, and time. He exhibits normal muscle tone. Coordination normal.  Skin: There is erythema.  Psychiatric: He has a normal mood and affect.          Assessment & Plan:  Improved cellulitis Rocephin 1g now RTC tomorrow

## 2014-06-26 NOTE — Patient Instructions (Signed)
Return tomorrow and see Dr. Cleta Albertsaub in morning

## 2014-07-03 DIAGNOSIS — Z0271 Encounter for disability determination: Secondary | ICD-10-CM

## 2014-08-08 ENCOUNTER — Ambulatory Visit (INDEPENDENT_AMBULATORY_CARE_PROVIDER_SITE_OTHER): Payer: 59 | Admitting: Urgent Care

## 2014-08-08 VITALS — BP 126/84 | HR 76 | Temp 98.0°F | Resp 17 | Ht 67.5 in | Wt 177.0 lb

## 2014-08-08 DIAGNOSIS — R300 Dysuria: Secondary | ICD-10-CM

## 2014-08-08 DIAGNOSIS — R319 Hematuria, unspecified: Secondary | ICD-10-CM | POA: Diagnosis not present

## 2014-08-08 DIAGNOSIS — R350 Frequency of micturition: Secondary | ICD-10-CM

## 2014-08-08 DIAGNOSIS — N390 Urinary tract infection, site not specified: Secondary | ICD-10-CM | POA: Diagnosis not present

## 2014-08-08 LAB — POCT CBC
Granulocyte percent: 72.7 %G (ref 37–80)
HCT, POC: 51.2 % (ref 43.5–53.7)
HEMOGLOBIN: 16.7 g/dL (ref 14.1–18.1)
Lymph, poc: 2 (ref 0.6–3.4)
MCH: 26.1 pg — AB (ref 27–31.2)
MCHC: 32.6 g/dL (ref 31.8–35.4)
MCV: 80.1 fL (ref 80–97)
MID (CBC): 0.3 (ref 0–0.9)
MPV: 10.1 fL (ref 0–99.8)
PLATELET COUNT, POC: 166 10*3/uL (ref 142–424)
POC Granulocyte: 6.3 (ref 2–6.9)
POC LYMPH PERCENT: 23.8 %L (ref 10–50)
POC MID %: 3.5 %M (ref 0–12)
RBC: 6.39 M/uL — AB (ref 4.69–6.13)
RDW, POC: 14.9 %
WBC: 8.6 10*3/uL (ref 4.6–10.2)

## 2014-08-08 LAB — POCT URINALYSIS DIPSTICK
BILIRUBIN UA: NEGATIVE
Glucose, UA: 500
Ketones, UA: NEGATIVE
NITRITE UA: NEGATIVE
Spec Grav, UA: 1.015
Urobilinogen, UA: 0.2
pH, UA: 5

## 2014-08-08 LAB — POCT UA - MICROSCOPIC ONLY
Casts, Ur, LPF, POC: NEGATIVE
Crystals, Ur, HPF, POC: NEGATIVE
Mucus, UA: NEGATIVE
Yeast, UA: NEGATIVE

## 2014-08-08 MED ORDER — CIPROFLOXACIN HCL 500 MG PO TABS: 500.0000 mg | ORAL_TABLET | Freq: Two times a day (BID) | ORAL | Status: DC

## 2014-08-08 NOTE — Progress Notes (Addendum)
MRN: [id number] DOB: [birth year]  Subjective:   Ricardo Mills is a 59 y.o. male with pmh of well-controlled diabetes presenting for chief complaint of Muscle Strain; Dysuria; and Hematuria  Reports 1 day history of new onset dysuria, slight hematuria this morning, urgency, frequency, pelvic pain. Has tried Tylenol without relief. Denies fevers, penile discharge, genital rashes, flank pain, n/v, abdominal pain, constipation, rectal pain. Denies history of renal stones. Reports that he has no concerns for an STI, monogamous relationship with his wife. Of note, patient is taking Invokana for diabetes. Denies smoking or alcohol use. Denies any other aggravating or relieving factors, no other questions or concerns.  Ricardo Mills has a current medication list which includes the following prescription(s): canagliflozin, glipizide, glucose blood, indomethacin, losartan, metformin, sildenafil, and simvastatin. He has No Known Allergies.  Ricardo Mills  has a past medical history of Diabetes mellitus without complication; Hyperlipidemia; and Hypertension. Also  has past surgical history that includes Circumcision.  ROS As in subjective.  Objective:   Vitals: BP 126/84 mmHg  Pulse 76  Temp(Src) 98 F (36.7 C) (Oral)  Resp 17  Ht 5' 7.5" (1.715 m)  Wt 177 lb (80.287 kg)  BMI 27.30 kg/m2  SpO2 97%  Physical Exam  Constitutional: He is oriented to person, place, and time and well-developed, well-nourished, and in no distress.  Cardiovascular: Normal rate, regular rhythm and intact distal pulses.  Exam reveals no gallop and no friction rub.   No murmur heard. Pulmonary/Chest: No respiratory distress. He has no wheezes. He has no rales. He exhibits no tenderness.  Abdominal: Soft. Bowel sounds are normal. He exhibits no distension and no mass. There is no tenderness.  No CVA tenderness.  Genitourinary: He exhibits no abnormal testicular mass, no testicular tenderness, no abnormal scrotal mass, no scrotal  tenderness and no epididymal tenderness. Cremasteric reflex is present. Penis exhibits no lesions and no edema. No discharge found.  Neurological: He is alert and oriented to person, place, and time.  Skin: Skin is warm and dry. No rash noted. No erythema. No pallor.   Results for orders placed or performed in visit on 08/08/14 (from the past 24 hour(s))  POCT urinalysis dipstick     Status: None   Collection Time: 08/08/14 10:32 AM  Result Value Ref Range   Color, UA yellow    Clarity, UA hazy    Glucose, UA 500    Bilirubin, UA neg    Ketones, UA neg    Spec Grav, UA 1.015    Blood, UA large    pH, UA 5.0    Protein, UA trace    Urobilinogen, UA 0.2    Nitrite, UA neg    Leukocytes, UA Trace   POCT UA - Microscopic Only     Status: None   Collection Time: 08/08/14 10:32 AM  Result Value Ref Range   WBC, Ur, HPF, POC tntc    RBC, urine, microscopic 3-6    Bacteria, U Microscopic 1+    Mucus, UA neg    Epithelial cells, urine per micros 0-2    Crystals, Ur, HPF, POC neg    Casts, Ur, LPF, POC neg    Yeast, UA neg    Renal tubular cells 1-5   POCT CBC     Status: Abnormal   Collection Time: 08/08/14 10:32 AM  Result Value Ref Range   WBC 8.6 4.6 - 10.2 K/uL   Lymph, poc 2.0 0.6 - 3.4   POC LYMPH PERCENT 23.8  10 - 50 %L   MID (cbc) 0.3 0 - 0.9   POC MID % 3.5 0 - 12 %M   POC Granulocyte 6.3 2 - 6.9   Granulocyte percent 72.7 37 - 80 %G   RBC 6.39 (A) 4.69 - 6.13 M/uL   Hemoglobin 16.7 14.1 - 18.1 g/dL   HCT, POC 11.9 14.7 - 53.7 %   MCV 80.1 80 - 97 fL   MCH, POC 26.1 (A) 27 - 31.2 pg   MCHC 32.6 31.8 - 35.4 g/dL   RDW, POC 82.9 %   Platelet Count, POC 166 142 - 424 K/uL   MPV 10.1 0 - 99.8 fL   Assessment and Plan :   1. Urinary frequency 2. Dysuria 3. Hematuria 4. Urinary tract infection with hematuria, site unspecified - Labs pending, may be due to Invokana use, start Ciprofloxacin x10 days, consider discontinuing Invokana if UTI persists, will results to  [phone] - Follow up in 10 days if no resolution of symptoms or sooner if symptoms worsen as discussed  Wallis Bamberg, PA-C [facility] [phone] 08/08/2014 9:57 AM

## 2014-08-08 NOTE — Progress Notes (Signed)
Hi Kathlene NovemberMike- if this pt's urine culture is negative will need to see urology for hematuria.  Also consider prostatitis.   Thanks ! JC

## 2014-08-08 NOTE — Patient Instructions (Signed)

## 2014-08-09 LAB — GC/CHLAMYDIA PROBE AMP
CT Probe RNA: NEGATIVE
GC Probe RNA: NEGATIVE

## 2014-08-10 LAB — URINE CULTURE

## 2014-11-01 ENCOUNTER — Other Ambulatory Visit: Payer: Self-pay | Admitting: Endocrinology

## 2014-11-19 ENCOUNTER — Ambulatory Visit (INDEPENDENT_AMBULATORY_CARE_PROVIDER_SITE_OTHER): Payer: 59 | Admitting: Family Medicine

## 2014-11-19 VITALS — BP 138/84 | HR 71 | Temp 97.5°F | Resp 16 | Ht 67.5 in | Wt 180.0 lb

## 2014-11-19 DIAGNOSIS — B370 Candidal stomatitis: Secondary | ICD-10-CM | POA: Diagnosis not present

## 2014-11-19 DIAGNOSIS — E119 Type 2 diabetes mellitus without complications: Secondary | ICD-10-CM

## 2014-11-19 DIAGNOSIS — I10 Essential (primary) hypertension: Secondary | ICD-10-CM | POA: Diagnosis not present

## 2014-11-19 DIAGNOSIS — E7800 Pure hypercholesterolemia, unspecified: Secondary | ICD-10-CM

## 2014-11-19 LAB — COMPLETE METABOLIC PANEL WITH GFR
ALT: 16 U/L (ref 0–53)
Alkaline Phosphatase: 63 U/L (ref 39–117)
CO2: 22 mEq/L (ref 19–32)
Calcium: 9.7 mg/dL (ref 8.4–10.5)
Chloride: 104 mEq/L (ref 96–112)
Creat: 0.85 mg/dL (ref 0.50–1.35)
GFR, Est African American: >89 mL/min
GFR, Est Non African American: >89 mL/min
Glucose, Bld: 75 mg/dL (ref 70–99)
Total Protein: 7.4 g/dL (ref 6.0–8.3)

## 2014-11-19 LAB — CBC
HCT: 46.2 % (ref 39.0–52.0)
Hemoglobin: 16.2 g/dL (ref 13.0–17.0)
MCH: 26.7 pg (ref 26.0–34.0)
MCHC: 35.1 g/dL (ref 30.0–36.0)
MCV: 76.1 fL — ABNORMAL LOW (ref 78.0–100.0)
Platelets: 152 10*3/uL (ref 150–400)
RBC: 6.07 MIL/uL — ABNORMAL HIGH (ref 4.22–5.81)
RDW: 16.1 % — ABNORMAL HIGH (ref 11.5–15.5)
WBC: 5.3 10*3/uL (ref 4.0–10.5)

## 2014-11-19 LAB — POCT UA - MICROSCOPIC ONLY
Bacteria, U Microscopic: NEGATIVE
Casts, Ur, LPF, POC: NEGATIVE
Crystals, Ur, HPF, POC: NEGATIVE
Mucus, UA: NEGATIVE
RBC, urine, microscopic: NEGATIVE
WBC, Ur, HPF, POC: NEGATIVE
Yeast, UA: NEGATIVE

## 2014-11-19 LAB — POCT URINALYSIS DIPSTICK
Bilirubin, UA: NEGATIVE
Blood, UA: NEGATIVE
Glucose, UA: 500
Ketones, UA: NEGATIVE
Leukocytes, UA: NEGATIVE
Nitrite, UA: NEGATIVE
Protein, UA: NEGATIVE
Spec Grav, UA: 1.02
Urobilinogen, UA: 0.2
pH, UA: 5

## 2014-11-19 LAB — GLUCOSE, POCT (MANUAL RESULT ENTRY): POC Glucose: 78 mg/dL (ref 70–99)

## 2014-11-19 LAB — LIPID PANEL
Cholesterol: 126 mg/dL (ref 0–200)
HDL: 49 mg/dL (ref >=40)
LDL Cholesterol: 65 mg/dL (ref 0–99)
Total CHOL/HDL Ratio: 2.6 ratio
Triglycerides: 58 mg/dL (ref <150)
VLDL: 12 mg/dL (ref 0–40)

## 2014-11-19 LAB — TSH: TSH: 1.114 u[IU]/mL (ref 0.350–4.500)

## 2014-11-19 LAB — COMPLETE METABOLIC PANEL WITHOUT GFR
AST: 16 U/L (ref 0–37)
Albumin: 4.7 g/dL (ref 3.5–5.2)
BUN: 12 mg/dL (ref 6–23)
Potassium: 4.4 meq/L (ref 3.5–5.3)
Sodium: 140 meq/L (ref 135–145)
Total Bilirubin: 0.5 mg/dL (ref 0.2–1.2)

## 2014-11-19 LAB — POCT GLYCOSYLATED HEMOGLOBIN (HGB A1C): Hemoglobin A1C: 7.2

## 2014-11-19 MED ORDER — GLIPIZIDE 5 MG PO TABS: 5.0000 mg | ORAL_TABLET | Freq: Every day | ORAL | Status: DC

## 2014-11-19 MED ORDER — SIMVASTATIN 20 MG PO TABS: 20.0000 mg | ORAL_TABLET | Freq: Every evening | ORAL | Status: DC

## 2014-11-19 MED ORDER — METFORMIN HCL 500 MG PO TABS: ORAL_TABLET | ORAL | Status: DC

## 2014-11-19 MED ORDER — MAGIC MOUTHWASH: 5.0000 mL | Freq: Three times a day (TID) | ORAL | Status: DC | PRN

## 2014-11-19 MED ORDER — CANAGLIFLOZIN 300 MG PO TABS: 300.0000 mg | ORAL_TABLET | Freq: Every day | ORAL | Status: DC

## 2014-11-19 NOTE — Patient Instructions (Signed)
Diabetes and Standards of Medical Care Diabetes is complicated. You may find that your diabetes team includes a dietitian, nurse, diabetes educator, eye doctor, and more. To help everyone know what is going on and to help you get the care you deserve, the following schedule of care was developed to help keep you on track. Below are the tests, exams, vaccines, medicines, education, and plans you will need. HbA1c test This test shows how well you have controlled your glucose over the past 2-3 months. It is used to see if your diabetes management plan needs to be adjusted.   It is performed at least 2 times a year if you are meeting treatment goals.  It is performed 4 times a year if therapy has changed or if you are not meeting treatment goals. Blood pressure test  This test is performed at every routine medical visit. The goal is less than 140/90 mm Hg for most people, but 130/80 mm Hg in some cases. Ask your health care provider about your goal. Dental exam  Follow up with the dentist regularly. Eye exam  If you are diagnosed with type 1 diabetes as a child, get an exam upon reaching the age of 37 years or older and have had diabetes for 3-5 years. Yearly eye exams are recommended after that initial eye exam.  If you are diagnosed with type 1 diabetes as an adult, get an exam within 5 years of diagnosis and then yearly.  If you are diagnosed with type 2 diabetes, get an exam as soon as possible after the diagnosis and then yearly. Foot care exam  Visual foot exams are performed at every routine medical visit. The exams check for cuts, injuries, or other problems with the feet.  A comprehensive foot exam should be done yearly. This includes visual inspection as well as assessing foot pulses and testing for loss of sensation.  Check your feet nightly for cuts, injuries, or other problems with your feet. Tell your health care provider if anything is not healing. Kidney function test (urine  microalbumin)  This test is performed once a year.  Type 1 diabetes: The first test is performed 5 years after diagnosis.  Type 2 diabetes: The first test is performed at the time of diagnosis.  A serum creatinine and estimated glomerular filtration rate (eGFR) test is done once a year to assess the level of chronic kidney disease (CKD), if present. Lipid profile (cholesterol, HDL, LDL, triglycerides)  Performed every 5 years for most people.  The goal for LDL is less than 100 mg/dL. If you are at high risk, the goal is less than 70 mg/dL.  The goal for HDL is 40 mg/dL-50 mg/dL for men and 50 mg/dL-60 mg/dL for women. An HDL cholesterol of 60 mg/dL or higher gives some protection against heart disease.  The goal for triglycerides is less than 150 mg/dL. Influenza vaccine, pneumococcal vaccine, and hepatitis B vaccine  The influenza vaccine is recommended yearly.  It is recommended that people with diabetes who are over 24 years old get the pneumonia vaccine. In some cases, two separate shots may be given. Ask your health care provider if your pneumonia vaccination is up to date.  The hepatitis B vaccine is also recommended for adults with diabetes. Diabetes self-management education  Education is recommended at diagnosis and ongoing as needed. Treatment plan  Your treatment plan is reviewed at every medical visit. Document Released: 02/22/2009 Document Revised: 09/11/2013 Document Reviewed: 09/27/2012 [facility] Patient Information 2015 [place],  [employer]. This information is not intended to replace advice given to you by your health care provider. Make sure you discuss any questions you have with your health care provider.  

## 2014-11-19 NOTE — Progress Notes (Signed)
Chief Complaint:  Chief Complaint  Patient presents with  . Medication Refill  . Ear Pain    Left/ onset 3 days  . Thrush    HPI: Ricardo Mills is a 59 y.o. male [facility] complaining of: 1. Not measuring diabetes, he can feels when he has high and lows. He gets dizzy when he has high sugars. He deneis any hypoglycemia. He has thrush again. He denies any numbness or tingling. No urinary issues. Last optomoetry visit was early 2016.  Denies neuropathy, CP, SOB,  palpitations, presyncope/syncope, polydipsia, polyuria, nausea, vomiting, abd pain 2. He wants me to check his left ear, has had some mild pressure in last 3 days, no swimming, no rash, no pain, numbness or tingling 2. Left ear pain 3 days, was not wearing era plugs, no simming.  Needs refill   BP Readings from Last 3 Encounters:  11/19/14 138/84  08/08/14 126/84  06/26/14 112/80   Lab Results  Component Value Date   HGBA1C 7.2 06/25/2014   HGBA1C 7.6 03/25/2014   HGBA1C 6.0 08/21/2013   Lab Results  Component Value Date   MICROALBUR 0.59 12/24/2011   LDLCALC 64 03/25/2014   CREATININE 0.90 03/25/2014     Past Medical History  Diagnosis Date  . Diabetes mellitus without complication   . Hyperlipidemia   . Hypertension    Past Surgical History  Procedure Laterality Date  . Circumcision     History   Social History  . Marital Status: Married    Spouse Name: [employer] [place] Michelle Needs  . Number of Children: 4  . Years of Education: 12   Occupational History  . 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 Smoking status: Never Smoker   . Smokeless tobacco: Never Used  . Alcohol Use: No     Comment: former alcohol use/ not currently  . Drug Use: No  . Sexual Activity:    Partners: Female   Other Topics Concern  . None   Social History Narrative   [facility]   Lives with his wife.   Regular exercise:  occasionally   Caffeine use: coffee daily   Family History  Problem Relation Age of Onset  . Diabetes Mother   . Hyperlipidemia Mother   . Hypertension Mother   . Diabetes Father   . Cancer Sister [id number]    uterus?  . Diabetes Sister   . Hypertension Brother   . Diabetes Sister   . Diabetes Sister   . Diabetes Sister   . Diabetes Brother   . Hyperlipidemia Brother   . Hypertension Brother   . Colon cancer Neg Hx    No Known Allergies Prior to Admission medications   Medication Sig Start Date End Date Taking? Authorizing Provider  canagliflozin (INVOKANA) 300 MG TABS tablet Take 300 mg by mouth daily. 03/25/14  Yes Tonye Pearsonobert P Doolittle, MD  ciprofloxacin (CIPRO) 500 MG tablet Take 1 tablet (500 mg total) by mouth 2 (two) times daily. 08/08/14  Yes Wallis BambergMario Mani, PA-C  glipiZIDE (GLUCOTROL) 5 MG tablet Take 1 tablet (5 mg total) by mouth daily before breakfast. Need to make sure you eat with this. 03/25/14  Yes Tonye Pearsonobert P Doolittle, MD  glucose blood (ONE TOUCH ULTRA TEST) test strip 1 each by Other route daily. And lancets 1/day 250.02 03/09/13  Yes Romero BellingSean Ellison, MD  indomethacin (INDOCIN) 50 MG capsule  Take 1 capsule (50 mg total) by mouth 3 (three) times daily with meals. 06/25/14  Yes Jonita Albee, MD  losartan (COZAAR) 100 MG tablet Take 1 tablet (100 mg total) by mouth daily. 03/25/14  Yes Tonye Pearson, MD  metFORMIN (GLUCOPHAGE) 500 MG tablet TAKE 2 TABLETS BY MOUTH 2 TIMES DAILY WITH A MEAL. 03/25/14  Yes Tonye Pearson, MD  sildenafil (REVATIO) 20 MG tablet TAKE 2-5 TABLETS BY MOUTH AS NEEDED FOR ERECTILE DYSFUNCTION 11/01/14  Yes Romero Belling, MD  simvastatin (ZOCOR) 20 MG tablet Take 1 tablet (20 mg total) by mouth every evening. 03/25/14  Yes Tonye Pearson, MD     ROS: The patient denies fevers, chills, night sweats, unintentional weight loss, chest pain, palpitations, wheezing, dyspnea on exertion, nausea, vomiting, abdominal pain, dysuria, hematuria, melena,  numbness, weakness, or tingling.   All other systems have been reviewed and were otherwise negative with the exception of those mentioned in the HPI and as above.    PHYSICAL EXAM: Filed Vitals:   11/19/14 1027  BP: 138/84  Pulse: 71  Temp: 97.5 F (36.4 C)  Resp: 16   Body mass index is 27.76 kg/(m^2).   General: Alert, no acute distress HEENT:  Normocephalic, atraumatic, oropharynx patent. fundo exam nl Eye: [facility], [facility] Cardiovascular:  Regular rate and rhythm, no rubs murmurs or gallops.  No Carotid bruits, radial pulse intact. No pedal edema.  Respiratory: Clear to auscultation bilaterally.  No wheezes, rales, or rhonchi.  No cyanosis, no use of accessory musculature Abdominal: No organomegaly, abdomen is soft and non-tender, positive bowel sounds.  No masses. Musculoskeletal: Gait intact. No edema, tenderness Skin: No rashes. Neurologic: Facial musculature symmetric. Psychiatric: Patient acts appropriately throughout our interaction. Lymphatic: No cervical or submandibular lymphadenopathy 5/5 strength, 2/2 DTRs     LABS:    EKG/XRAY:   Primary read interpreted by Dr. Conley Rolls at [facility].   ASSESSMENT/PLAN: 1 Diabetes-labs and refill medication follow-up in 4 months 2 Thrush-magic mouthwash 3. Left sided ear pain-negative for rash. Currently on physical exam his left ear is normal. If he continues to have any type of worsening pain or rash HEENT still will let me know so we can put him on Valtrex for shingles. Follow-up in 4 months  Gross sideeffects, risk and benefits, and alternatives of medications d/w patient. Patient is aware that all medications have potential sideeffects and we are unable to predict every sideeffect or drug-drug interaction that may occur.  Lief Palmatier DO   11/19/2014 12:04 PM

## 2014-11-20 LAB — MICROALBUMIN, URINE: Microalb, Ur: 0.3 mg/dL (ref <2.0)

## 2014-12-06 ENCOUNTER — Other Ambulatory Visit: Payer: Self-pay | Admitting: Internal Medicine

## 2014-12-12 ENCOUNTER — Ambulatory Visit (INDEPENDENT_AMBULATORY_CARE_PROVIDER_SITE_OTHER): Payer: 59 | Admitting: Family Medicine

## 2014-12-12 VITALS — BP 122/70 | HR 78 | Temp 98.2°F | Resp 16 | Ht 65.5 in | Wt 181.0 lb

## 2014-12-12 DIAGNOSIS — E119 Type 2 diabetes mellitus without complications: Secondary | ICD-10-CM | POA: Diagnosis not present

## 2014-12-12 DIAGNOSIS — I10 Essential (primary) hypertension: Secondary | ICD-10-CM | POA: Diagnosis not present

## 2014-12-12 DIAGNOSIS — R42 Dizziness and giddiness: Secondary | ICD-10-CM | POA: Diagnosis not present

## 2014-12-12 DIAGNOSIS — R001 Bradycardia, unspecified: Secondary | ICD-10-CM

## 2014-12-12 DIAGNOSIS — Z111 Encounter for screening for respiratory tuberculosis: Secondary | ICD-10-CM

## 2014-12-12 LAB — POCT CBC
Granulocyte percent: 54.4 %G (ref 37–80)
HCT, POC: 48.7 % (ref 43.5–53.7)
Hemoglobin: 16.2 g/dL (ref 14.1–18.1)
Lymph, poc: 3 (ref 0.6–3.4)
MCH, POC: 25.7 pg — AB (ref 27–31.2)
MCHC: 33.3 g/dL (ref 31.8–35.4)
MCV: 77.4 fL — AB (ref 80–97)
MID (cbc): 0.2 (ref 0–0.9)
MPV: 9.6 fL (ref 0–99.8)
PLATELET COUNT, POC: 174 10*3/uL (ref 142–424)
POC Granulocyte: 3.8 (ref 2–6.9)
POC LYMPH %: 42.3 % (ref 10–50)
POC MID %: 3.3 % (ref 0–12)
RBC: 6.29 M/uL — AB (ref 4.69–6.13)
RDW, POC: 14.8 %
WBC: 7 10*3/uL (ref 4.6–10.2)

## 2014-12-12 LAB — GLUCOSE, POCT (MANUAL RESULT ENTRY): POC GLUCOSE: 85 mg/dL (ref 70–99)

## 2014-12-12 NOTE — Progress Notes (Addendum)
Subjective:    Patient ID: Ricardo Mills, male    DOB: 1956-05-07, 59 y.o.   MRN: [id number] This chart was scribed for Meredith Staggers, MD by Jolene Provost, Medical Scribe. This patient was seen in Room 11 and the patient's care was started a 1:18 PM.  Chief Complaint  Patient presents with  . Annual Exam  . Immunizations    TB   . Medication Refill  . Dizziness    1 week ago     HPI HPI Comments: Ricardo Mills is a 59 y.o. male with a past hx of hyperlipidemia, HTN, DM who presents to [facility] reporting for a full physical and to have a health form filled out for work. Needs a TB screening test as well. The pt is also complaining of acute dizziness. Pt's PCP is Dr. Conley Rolls, and she last saw him July 11th. Pt had Normal hemoglobin, TSH, CNP on July 11th. Pt also states something flew into his ear one week prior to exam, had some soreness following, he had it checked and there were no focal findings on exam.   Pt is complaining of dizziness for one week following coming back from a family reunion in [place]. His dizziness has been resolved for five days. He believes his dizziness was due to eating too much sugar at the family reunion. He denies slurred speech, CP, palpitations, SOB or weakness in arms or legs while he was having his sx. He describes his sx as feeling unsteady while walking, but no fall.  Did have a headache on last 2 days of dizziness, but resolved with tylenol. The pt has no past hx of stroke. He did not check his blood pressure or sugar while he was having his sx. Pt denies the sensation of room spinning while he was having his sx. Pt states he has never missed taking any of his medications.   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 Denies past hx of psychiatric illnesses.   DM: Lab Results  Component Value Date   HGBA1C 7.2 11/19/2014    Patient Active Problem List   Diagnosis Date Noted  . Halitosis 03/28/2012  . Type II or unspecified type  diabetes mellitus without mention of complication, uncontrolled 06/21/2011  . HTN (hypertension) 06/21/2011  . Hyperlipidemia LDL goal <70 06/21/2011  . ED (erectile dysfunction) 06/21/2011   Past Medical History  Diagnosis Date  . Diabetes mellitus without complication   . Hyperlipidemia   . Hypertension    Past Surgical History  Procedure Laterality Date  . Circumcision     No Known Allergies Prior to Admission medications   Medication Sig Start Date End Date Taking? Authorizing Provider  Alum & Mag Hydroxide-Simeth (MAGIC MOUTHWASH) SOLN Take 5 mLs by mouth 3 (three) times daily as needed for mouth pain. 11/19/14  Yes Thao P Le, DO  canagliflozin (INVOKANA) 300 MG TABS tablet Take 300 mg by mouth daily. 11/19/14  Yes Thao P Le, DO  glipiZIDE (GLUCOTROL) 5 MG tablet Take 1 tablet (5 mg total) by mouth daily before breakfast. Need to make sure you eat with this. 11/19/14  Yes Thao P Le, DO  glucose blood (ONE TOUCH ULTRA TEST) test strip 1 each by Other route daily. And lancets 1/day 250.02 03/09/13  Yes Romero Belling, MD  losartan (COZAAR) 100 MG tablet TAKE 1 TABLET (100 MG TOTAL) BY MOUTH DAILY. 12/07/14  Yes Thao P Le, DO  metFORMIN (GLUCOPHAGE) 500  MG tablet TAKE 2 TABLETS BY MOUTH 2 TIMES DAILY WITH A MEAL. 11/19/14  Yes Thao P Le, DO  simvastatin (ZOCOR) 20 MG tablet Take 1 tablet (20 mg total) by mouth every evening. 11/19/14  Yes Thao P Le, DO   History   Social History  . Marital Status: Married    Spouse Name: [employer]  . Number of Children: 4  . Years of Education: 12   Occupational History  . 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 Smoking status: Never Smoker   . Smokeless tobacco: Never Used  . Alcohol Use: No     Comment: former alcohol use/ not currently  . Drug Use: No  . Sexual Activity:    Partners: Female   Other Topics Concern  . Not on file   Social History Narrative    Malen Gauze Parents   Lives with his wife.   Regular exercise: occasionally   Caffeine use: coffee daily    Review of Systems  Constitutional: Negative for fever and chills.  Cardiovascular: Negative for chest pain and palpitations.  Neurological: Positive for dizziness.       Objective:   Physical Exam  Constitutional: He is oriented to person, place, and time. He appears well-developed and well-nourished. No distress.  HENT:  Head: Normocephalic and atraumatic.  TMs pearly grey, no fluid behind TMs.  Eyes: Pupils are equal, round, and reactive to light.  Neck: Neck supple.  Cardiovascular: Normal rate, regular rhythm and normal heart sounds.  Exam reveals no gallop and no friction rub.   No murmur heard. Pulmonary/Chest: Effort normal and breath sounds normal. No respiratory distress. He has no wheezes.  Musculoskeletal: Normal range of motion.  Neurological: He is alert and oriented to person, place, and time. Coordination normal.  Negative rhomberg. Normal finger to nose, normal heel to toe. No pronator drift.   Skin: Skin is warm and dry. He is not diaphoretic.  Psychiatric: He has a normal mood and affect. His behavior is normal.  Nursing note and vitals reviewed.   Filed Vitals:   12/12/14 1127  BP: 122/70  Pulse: 78  Temp: 98.2 F (36.8 C)  Resp: 16  Height: 5' 5.5" (1.664 m)  Weight: 181 lb (82.101 kg)  SpO2: 98%   EKG: sinus bradycardia, rate 59.  No concerning findings otherwise or on rhythm strip. No prior EKG available for review.   Results for orders placed or performed in visit on 12/12/14  POCT CBC  Result Value Ref Range   WBC 7.0 4.6 - 10.2 K/uL   Lymph, poc 3.0 0.6 - 3.4   POC LYMPH PERCENT 42.3 10 - 50 %L   MID (cbc) 0.2 0 - 0.9   POC MID % 3.3 0 - 12 %M   POC Granulocyte 3.8 2 - 6.9   Granulocyte percent 54.4 37 - 80 %G   RBC 6.29 (A) 4.69 - 6.13 M/uL   Hemoglobin 16.2 14.1 - 18.1 g/dL   HCT, POC 16.1 09.6 - 53.7 %   MCV 77.4 (A) 80 - 97 fL    MCH, POC 25.7 (A) 27 - 31.2 pg   MCHC 33.3 31.8 - 35.4 g/dL   RDW, POC 04.5 %   Platelet Count, POC 174 142 - 424 K/uL   MPV 9.6 0 - 99.8 fL  POCT glucose (manual entry)  Result Value Ref Range   POC Glucose 85 70 -  99 mg/dl      Assessment & Plan:  Ricardo Mills is a 59 y.o. male Diabetes type 2, controlled - Plan: EKG 12-Lead, POCT glucose (manual entry)  -overall controlled at last eval in July.  Glucose ok in office. Episode of dizziness may have been related to hypoglycemia, but no readings noted at the time.  Check home blood sugars, follow up with PCP, monitor for sx's of hypoglycemia and if occurs - rtc sooner.   Dizziness - Plan: EKG 12-Lead, POCT CBC  -1 week of symptoms.  DDx of vertigo, hypoglycemia, less likley cardiac or neurologic cause.  but with hx of DM, HTN, hyperlipidemia - recommended cardiology eval and possible neuroimaging.  He declined at present.  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 Will defer to that follow up.   Essential hypertension - Plan: EKG 12-Lead  -stable in office.  Screening for tuberculosis - Plan: TB Skin Test  -PPD placed for work form. Return for reading in 48-72 hours.  Bradycardia  -On EKG. Asymptomatic at present. Less likely cause of dizziness last week, but again recommended cardiology evaluation plus or minus Holter monitor. Declined at present. RTC/ER precautions discussed.  No orders of the defined types were placed in this encounter.   Patient Instructions  Your medications appear to have been sent in July. Have your pharmacy call us if they still do not have these on file for you.  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 We placed a TB skin test to be read in  48-72 hours to have that part of your paperwork completed, but I'm unable to complete the rest your paperwork today until further evaluation of your dizziness spell last week.   Additionally you do have a slightly low heart rate on the EKG today. As discussed I would recommend follow-up with cardiology to discuss the dizziness from last week as well as a slow heart rate.  Additionally CT scan or MRI of the brain could help rule out a neurologic cause of your symptoms last week. She change her mind about either of these evaluations, let me know, otherwise plan to follow-up with your primary care provider Dr. Conley Rolls within the next 1-2 weeks to discuss the symptoms further and workup.  If dizziness returns, go to the emergency room or call 911.   Dizziness Dizziness is a common problem. It is a feeling of unsteadiness or light-headedness. You may feel like you are about to faint. Dizziness can lead to injury if you stumble or fall. A person of any age group can suffer from dizziness, but dizziness is more common in older adults. CAUSES  Dizziness can be caused by many different things, including:  Middle ear problems.  Standing for too long.  Infections.  An allergic reaction.  Aging.  An emotional response to something, such as the sight of blood.  Side effects of medicines.  Tiredness.  Problems with circulation or blood pressure.  Excessive use of alcohol or medicines, or illegal drug use.  Breathing too fast (hyperventilation).  An irregular heart rhythm (arrhythmia).  A low red blood cell count (anemia).  Pregnancy.  Vomiting, diarrhea, fever, or other illnesses that cause body fluid loss (dehydration).  Diseases or  conditions such as Parkinson's disease, high blood pressure (hypertension), diabetes, and thyroid problems.  Exposure to extreme heat. DIAGNOSIS  Your health care provider will ask about your symptoms, perform a physical exam, and perform an electrocardiogram  (ECG) to record the electrical activity of your heart. Your health care provider may also perform other heart or blood tests to determine the cause of your dizziness. These may include:  Transthoracic echocardiogram (TTE). During echocardiography, sound waves are used to evaluate how blood flows through your heart.  Transesophageal echocardiogram (TEE).  Cardiac monitoring. This allows your health care provider to monitor your heart rate and rhythm in real time.  Holter monitor. This is a portable device that records your heartbeat and can help diagnose heart arrhythmias. It allows your health care provider to track your heart activity for several days if needed.  Stress tests by exercise or by giving medicine that makes the heart beat faster. TREATMENT  Treatment of dizziness depends on the cause of your symptoms and can vary greatly. HOME CARE INSTRUCTIONS   Drink enough fluids to keep your urine clear or pale yellow. This is especially important in very hot weather. In older adults, it is also important in cold weather.  Take your medicine exactly as directed if your dizziness is caused by medicines. When taking blood pressure medicines, it is especially important to get up slowly.  Rise slowly from chairs and steady yourself until you feel okay.  In the morning, first sit up on the side of the bed. When you feel okay, stand slowly while holding onto something until you know your balance is fine.  Move your legs often if you need to stand in one place for a long time. Tighten and relax your muscles in your legs while standing.  Have someone stay with you for 1-2 days if dizziness continues to be a problem. Do this until you feel you are well enough to stay alone. Have the person call your health care provider if he or she notices changes in you that are concerning.  Do not drive or use heavy machinery if you feel dizzy.  Do not drink alcohol. SEEK IMMEDIATE MEDICAL CARE IF:   Your  dizziness or light-headedness gets worse.  You feel nauseous or vomit.  You have problems talking, walking, or using your arms, hands, or legs.  You feel weak.  You are not thinking clearly or you have trouble forming sentences. It may take a friend or family member to notice this.  You have chest pain, abdominal pain, shortness of breath, or sweating.  Your vision changes.  You notice any bleeding.  You have side effects from medicine that seems to be getting worse rather than better. MAKE SURE YOU:   Understand these instructions.  Will watch your condition.  Will get help right away if you are not doing well or get worse. Document Released: 10/21/2000 Document Revised: 05/02/2013 Document Reviewed: 11/14/2010 [facility] Patient Information 2015 [place], [place]. This information is not intended to replace advice given to you by your health care provider. Make sure you discuss any questions you have with your health care provider.       I personally performed the services described in this documentation, which was scribed in my presence. The recorded information has been reviewed and considered, and addended by me as needed.

## 2014-12-12 NOTE — Patient Instructions (Addendum)
Your medications appear to have been sent in July. Have your pharmacy call us if they still do not have these on file for you.  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 We placed a TB skin test to be read in 48-72 hours to have that part of your paperwork completed, but I'm unable to complete the rest your paperwork today until further evaluation of your dizziness spell last week.   Additionally you do have a slightly low heart rate on the EKG today. As discussed I would recommend follow-up with cardiology to discuss the dizziness from last week as well as a slow heart rate.  Additionally CT scan or MRI of the brain could help rule out a neurologic cause of your symptoms last week. She change her mind about either of these evaluations, let me know, otherwise plan to follow-up with your primary care provider Dr. Conley Rolls within the next 1-2 weeks to discuss the symptoms further and workup.  If dizziness returns, go to the emergency room or call 911.   Dizziness Dizziness is a common problem. It is a feeling of unsteadiness or light-headedness. You may feel like you are about to faint. Dizziness can lead to injury if you stumble or fall. A person of any age group can suffer from dizziness, but dizziness is more common in older adults. CAUSES  Dizziness can be caused by many different things, including:  Middle ear problems.  Standing for too long.  Infections.  An allergic reaction.  Aging.  An emotional response to something, such as the sight of blood.  Side effects of medicines.  Tiredness.  Problems with circulation or blood pressure.  Excessive use of alcohol or medicines, or illegal drug use.  Breathing too fast (hyperventilation).  An irregular heart rhythm (arrhythmia).  A low red blood cell count (anemia).  Pregnancy.  Vomiting, diarrhea, fever, or  other illnesses that cause body fluid loss (dehydration).  Diseases or conditions such as Parkinson's disease, high blood pressure (hypertension), diabetes, and thyroid problems.  Exposure to extreme heat. DIAGNOSIS  Your health care provider will ask about your symptoms, perform a physical exam, and perform an electrocardiogram (ECG) to record the electrical activity of your heart. Your health care provider may also perform other heart or blood tests to determine the cause of your dizziness. These may include:  Transthoracic echocardiogram (TTE). During echocardiography, sound waves are used to evaluate how blood flows through your heart.  Transesophageal echocardiogram (TEE).  Cardiac monitoring. This allows your health care provider to monitor your heart rate and rhythm in real time.  Holter monitor. This is a portable device that records your heartbeat and can help diagnose heart arrhythmias. It allows your health care provider to track your heart activity for several days if needed.  Stress tests by exercise or by giving medicine that makes the heart beat faster. TREATMENT  Treatment of dizziness depends on the cause of your symptoms and can vary greatly. HOME CARE INSTRUCTIONS   Drink enough fluids to keep your urine clear or pale yellow. This is especially important in very hot weather. In older adults, it is also important in cold weather.  Take your medicine exactly as directed if your dizziness is caused by medicines. When taking blood pressure medicines, it is especially important to get up slowly.  Rise slowly from chairs and steady yourself until  you feel okay.  In the morning, first sit up on the side of the bed. When you feel okay, stand slowly while holding onto something until you know your balance is fine.  Move your legs often if you need to stand in one place for a long time. Tighten and relax your muscles in your legs while standing.  Have someone stay with you for  1-2 days if dizziness continues to be a problem. Do this until you feel you are well enough to stay alone. Have the person call your health care provider if he or she notices changes in you that are concerning.  Do not drive or use heavy machinery if you feel dizzy.  Do not drink alcohol. SEEK IMMEDIATE MEDICAL CARE IF:   Your dizziness or light-headedness gets worse.  You feel nauseous or vomit.  You have problems talking, walking, or using your arms, hands, or legs.  You feel weak.  You are not thinking clearly or you have trouble forming sentences. It may take a friend or family member to notice this.  You have chest pain, abdominal pain, shortness of breath, or sweating.  Your vision changes.  You notice any bleeding.  You have side effects from medicine that seems to be getting worse rather than better. MAKE SURE YOU:   Understand these instructions.  Will watch your condition.  Will get help right away if you are not doing well or get worse. Document Released: 10/21/2000 Document Revised: 05/02/2013 Document Reviewed: 11/14/2010 [facility] Patient Information 2015 [place], [place]. This information is not intended to replace advice given to you by your health care provider. Make sure you discuss any questions you have with your health care provider.

## 2014-12-14 ENCOUNTER — Ambulatory Visit (INDEPENDENT_AMBULATORY_CARE_PROVIDER_SITE_OTHER): Payer: 59

## 2014-12-14 DIAGNOSIS — Z111 Encounter for screening for respiratory tuberculosis: Secondary | ICD-10-CM

## 2014-12-14 DIAGNOSIS — Z7689 Persons encountering health services in other specified circumstances: Secondary | ICD-10-CM

## 2014-12-14 LAB — TB SKIN TEST
Induration: 0 mm
TB Skin Test: NEGATIVE

## 2015-01-11 ENCOUNTER — Encounter: Payer: Self-pay | Admitting: Family Medicine

## 2015-02-21 ENCOUNTER — Ambulatory Visit (INDEPENDENT_AMBULATORY_CARE_PROVIDER_SITE_OTHER): Payer: 59 | Admitting: Physician Assistant

## 2015-02-21 VITALS — BP 120/76 | HR 77 | Temp 98.0°F | Resp 16 | Ht 67.0 in | Wt 180.0 lb

## 2015-02-21 DIAGNOSIS — K219 Gastro-esophageal reflux disease without esophagitis: Secondary | ICD-10-CM

## 2015-02-21 MED ORDER — ESOMEPRAZOLE MAGNESIUM 20 MG PO CPDR: 20.0000 mg | DELAYED_RELEASE_CAPSULE | Freq: Every day | ORAL | Status: DC

## 2015-02-21 MED ORDER — RANITIDINE HCL 150 MG PO TABS: 150.0000 mg | ORAL_TABLET | Freq: Two times a day (BID) | ORAL | Status: DC

## 2015-02-21 NOTE — Progress Notes (Signed)
[facility] [address], [place] [address] [phone]  Date:  02/21/2015   Name:  Ricardo Mills   DOB:  1955-10-20   MRN:  [id number]  PCP:  Rockne CoonsLE, THAO PHUONG, DO    History of Present Illness:  Ricardo Mills is a 59 y.o. male patient who [facility] for chief complaint of burning sensation in chest.  This started yesterday with a burning sensation at his upper abdomen and radiating up to his neck.  He has sour brash taste.  No nausea or vomiting.  No change in appetite.  No blood in stool or melena.  No dizziness or lightheadedness.  He eats mostly fruit during the day, and yesterday had a headache and took a tylenol.  He normally does not take nsaids.  No etOH use.  1 cup of coffee per day.   No associated palpitations, sob, dizziness, leg swelling, dyspnea, or diaphoresis directly with sxs.   Patient Active Problem List   Diagnosis Date Noted  . Halitosis 03/28/2012  . Type II or unspecified type diabetes mellitus without mention of complication, uncontrolled 06/21/2011  . HTN (hypertension) 06/21/2011  . Hyperlipidemia LDL goal <70 06/21/2011  . ED (erectile dysfunction) 06/21/2011    Past Medical History  Diagnosis Date  . Diabetes mellitus without complication (HCC)   . Hyperlipidemia   . Hypertension     Past Surgical History  Procedure Laterality Date  . Circumcision      Social History  Substance Use Topics  . Smoking status: Never Smoker   . Smokeless tobacco: Never Used  . Alcohol Use: No     Comment: former alcohol use/ not currently    Family History  Problem Relation Age of Onset  . Diabetes Mother   . Hyperlipidemia Mother   . Hypertension Mother   . Diabetes Father   . Cancer Sister [id number]    uterus?  . Diabetes Sister   . Hypertension Brother   . Diabetes Sister   . Diabetes Sister   . Diabetes Sister   . Diabetes Brother   . Hyperlipidemia Brother   . Hypertension Brother   . Colon cancer Neg Hx     No Known  Allergies  Medication list has been reviewed and updated.  Current Outpatient Prescriptions on File Prior to Visit  Medication Sig Dispense Refill  . Alum & Mag Hydroxide-Simeth (MAGIC MOUTHWASH) SOLN Take 5 mLs by mouth 3 (three) times daily as needed for mouth pain. 210 mL 0  . canagliflozin (INVOKANA) 300 MG TABS tablet Take 300 mg by mouth daily. 90 tablet 3  . glipiZIDE (GLUCOTROL) 5 MG tablet Take 1 tablet (5 mg total) by mouth daily before breakfast. Need to make sure you eat with this. 90 tablet 3  . glucose blood (ONE TOUCH ULTRA TEST) test strip 1 each by Other route daily. And lancets 1/day 250.02 100 each 12  . losartan (COZAAR) 100 MG tablet TAKE 1 TABLET (100 MG TOTAL) BY MOUTH DAILY. 90 tablet 1  . metFORMIN (GLUCOPHAGE) 500 MG tablet TAKE 2 TABLETS BY MOUTH 2 TIMES DAILY WITH A MEAL. 360 tablet 3  . simvastatin (ZOCOR) 20 MG tablet Take 1 tablet (20 mg total) by mouth every evening. 90 tablet 3   No current facility-administered medications on file prior to visit.    ROS ROS otherwise unremarkable unless listed above.   Physical Examination: BP 120/76 mmHg  Pulse 77  Temp(Src) 98 F (36.7 C) (  Oral)  Resp 16  Ht  (1.702 m)  Wt 180 lb (81.647 kg)  BMI 28.19 kg/m2  SpO2 98% Ideal Body Weight: Weight in (lb) to have BMI = 25: 159.3  Physical Exam  Constitutional: He is oriented to person, place, and time. He appears well-developed and well-nourished. No distress.  HENT:  Head: Normocephalic and atraumatic.  Right Ear: Tympanic membrane, external ear and ear canal normal.  Left Ear: Tympanic membrane, external ear and ear canal normal.  Mouth/Throat: No uvula swelling. No oropharyngeal exudate, posterior oropharyngeal edema or posterior oropharyngeal erythema.  Eyes: Conjunctivae, EOM and lids are normal. Pupils are equal, round, and reactive to light. Right eye exhibits normal extraocular motion. Left eye exhibits normal extraocular motion.  Neck: Trachea  normal and full passive range of motion without pain. No edema and no erythema present.  Cardiovascular: Normal rate and regular rhythm.  Exam reveals no gallop and no friction rub.   No murmur heard. Pulses:      Radial pulses are 2+ on the right side, and 2+ on the left side.  Pulmonary/Chest: Effort normal. No respiratory distress. He has no decreased breath sounds. He has no wheezes. He has no rhonchi.  Abdominal: Soft. Normal appearance and bowel sounds are normal. There is no tenderness.  Neurological: He is alert and oriented to person, place, and time.  Skin: Skin is warm and dry. He is not diaphoretic.  Psychiatric: He has a normal mood and affect. His behavior is normal.     Assessment and Plan: Ricardo Mills is a 59 y.o. male who is here today with signs burning sensation in chest.  This is consistent with GERD.  Responded well to GI cocktail.  Starting him on h2 blocker and PPI at this time.  Advised that he take on a GERD appropriate diet.   He will return in 4 weeks if sxs do not improve, and sooner if worsening signs.  This may subside sooner, as likely the coupling of acidic fruits with tylenol not common routine for him.    Gastroesophageal reflux disease, esophagitis presence not specified - Plan: ranitidine (ZANTAC) 150 MG tablet, esomeprazole (NEXIUM) 20 MG capsule    Trena Platt, PA-C [facility] 02/21/2015 4:23 PM

## 2015-02-21 NOTE — Patient Instructions (Signed)
Gastroesophageal Reflux Disease, Adult Normally, food travels down the esophagus and stays in the stomach to be digested. However, when a person has gastroesophageal reflux disease (GERD), food and stomach acid move back up into the esophagus. When this happens, the esophagus becomes sore and inflamed. Over time, GERD can create small holes (ulcers) in the lining of the esophagus.  CAUSES This condition is caused by a problem with the muscle between the esophagus and the stomach (lower esophageal sphincter, or LES). Normally, the LES muscle closes after food passes through the esophagus to the stomach. When the LES is weakened or abnormal, it does not close properly, and that allows food and stomach acid to go back up into the esophagus. The LES can be weakened by certain dietary substances, medicines, and medical conditions, including:  Tobacco use.  Pregnancy.  Having a hiatal hernia.  Heavy alcohol use.  Certain foods and beverages, such as coffee, chocolate, onions, and peppermint. RISK FACTORS This condition is more likely to develop in:  People who have an increased body weight.  People who have connective tissue disorders.  People who use NSAID medicines. SYMPTOMS Symptoms of this condition include:  Heartburn.  Difficult or painful swallowing.  The feeling of having a lump in the throat.  Abitter taste in the mouth.  Bad breath.  Having a large amount of saliva.  Having an upset or bloated stomach.  Belching.  Chest pain.  Shortness of breath or wheezing.  Ongoing (chronic) cough or a night-time cough.  Wearing away of tooth enamel.  Weight loss. Different conditions can cause chest pain. Make sure to see your health care provider if you experience chest pain. DIAGNOSIS Your health care provider will take a medical history and perform a physical exam. To determine if you have mild or severe GERD, your health care provider may also monitor how you respond  to treatment. You may also have other tests, including:  An endoscopy toexamine your stomach and esophagus with a small camera.  A test thatmeasures the acidity level in your esophagus.  A test thatmeasures how much pressure is on your esophagus.  A barium swallow or modified barium swallow to show the shape, size, and functioning of your esophagus. TREATMENT The goal of treatment is to help relieve your symptoms and to prevent complications. Treatment for this condition may vary depending on how severe your symptoms are. Your health care provider may recommend:  Changes to your diet.  Medicine.  Surgery. HOME CARE INSTRUCTIONS Diet  Follow a diet as recommended by your health care provider. This may involve avoiding foods and drinks such as:  Coffee and tea (with or without caffeine).  Drinks that containalcohol.  Energy drinks and sports drinks.  Carbonated drinks or sodas.  Chocolate and cocoa.  Peppermint and mint flavorings.  Garlic and onions.  Horseradish.  Spicy and acidic foods, including peppers, chili powder, curry powder, vinegar, hot sauces, and barbecue sauce.  Citrus fruit juices and citrus fruits, such as oranges, lemons, and limes.  Tomato-based foods, such as red sauce, chili, salsa, and pizza with red sauce.  Fried and fatty foods, such as donuts, french fries, potato chips, and high-fat dressings.  High-fat meats, such as hot dogs and fatty cuts of red and white meats, such as rib eye steak, sausage, ham, and bacon.  High-fat dairy items, such as whole milk, butter, and cream cheese.  Eat small, frequent meals instead of large meals.  Avoid drinking large amounts of liquid with your   meals.  Avoid eating meals during the 2-3 hours before bedtime.  Avoid lying down right after you eat.  Do not exercise right after you eat. General Instructions  Pay attention to any changes in your symptoms.  Take over-the-counter and prescription  medicines only as told by your health care provider. Do not take aspirin, ibuprofen, or other NSAIDs unless your health care provider told you to do so.  Do not use any tobacco products, including cigarettes, chewing tobacco, and e-cigarettes. If you need help quitting, ask your health care provider.  Wear loose-fitting clothing. Do not wear anything tight around your waist that causes pressure on your abdomen.  Raise (elevate) the head of your bed 6 inches (15cm).  Try to reduce your stress, such as with yoga or meditation. If you need help reducing stress, ask your health care provider.  If you are overweight, reduce your weight to an amount that is healthy for you. Ask your health care provider for guidance about a safe weight loss goal.  Keep all follow-up visits as told by your health care provider. This is important. SEEK MEDICAL CARE IF:  You have new symptoms.  You have unexplained weight loss.  You have difficulty swallowing, or it hurts to swallow.  You have wheezing or a persistent cough.  Your symptoms do not improve with treatment.  You have a hoarse voice. SEEK IMMEDIATE MEDICAL CARE IF:  You have pain in your arms, neck, jaw, teeth, or back.  You feel sweaty, dizzy, or light-headed.  You have chest pain or shortness of breath.  You vomit and your vomit looks like blood or coffee grounds.  You faint.  Your stool is bloody or black.  You cannot swallow, drink, or eat.   This information is not intended to replace advice given to you by your health care provider. Make sure you discuss any questions you have with your health care provider.   Document Released: 02/04/2005 Document Revised: 01/16/2015 Document Reviewed: 08/22/2014 Elsevier Interactive Patient Education 2016 Elsevier Inc. Food Choices for Gastroesophageal Reflux Disease, Adult When you have gastroesophageal reflux disease (GERD), the foods you eat and your eating habits are very important.  Choosing the right foods can help ease the discomfort of GERD. WHAT GENERAL GUIDELINES DO I NEED TO FOLLOW?  Choose fruits, vegetables, whole grains, low-fat dairy products, and low-fat meat, fish, and poultry.  Limit fats such as oils, salad dressings, butter, nuts, and avocado.  Keep a food diary to identify foods that cause symptoms.  Avoid foods that cause reflux. These may be different for different people.  Eat frequent small meals instead of three large meals each day.  Eat your meals slowly, in a relaxed setting.  Limit fried foods.  Cook foods using methods other than frying.  Avoid drinking alcohol.  Avoid drinking large amounts of liquids with your meals.  Avoid bending over or lying down until 2-3 hours after eating. WHAT FOODS ARE NOT RECOMMENDED? The following are some foods and drinks that may worsen your symptoms: Vegetables Tomatoes. Tomato juice. Tomato and spaghetti sauce. Chili peppers. Onion and garlic. Horseradish. Fruits Oranges, grapefruit, and lemon (fruit and juice). Meats High-fat meats, fish, and poultry. This includes hot dogs, ribs, ham, sausage, salami, and bacon. Dairy Whole milk and chocolate milk. Sour cream. Cream. Butter. Ice cream. Cream cheese.  Beverages Coffee and tea, with or without caffeine. Carbonated beverages or energy drinks. Condiments Hot sauce. Barbecue sauce.  Sweets/Desserts Chocolate and cocoa. Donuts. Peppermint and spearmint.   Fats and Oils High-fat foods, including French fries and potato chips. Other Vinegar. Strong spices, such as black pepper, white pepper, red pepper, cayenne, curry powder, cloves, ginger, and chili powder. The items listed above may not be a complete list of foods and beverages to avoid. Contact your dietitian for more information.   This information is not intended to replace advice given to you by your health care provider. Make sure you discuss any questions you have with your health care  provider.   Document Released: 04/27/2005 Document Revised: 05/18/2014 Document Reviewed: 03/01/2013 Elsevier Interactive Patient Education 2016 Elsevier Inc.  

## 2015-06-03 ENCOUNTER — Other Ambulatory Visit: Payer: Self-pay | Admitting: Family Medicine

## 2015-06-03 MED FILL — glipiZIDE 5 MG TABS: 5 | 90 days supply | Qty: 90 | Fill #2

## 2015-06-03 MED FILL — metFORMIN HCL 500 MG TABS: 500 | 90 days supply | Qty: 360 | Fill #1

## 2015-06-04 MED FILL — LOSARTAN POTASSIUM 100 MG T: 100 | 90 days supply | Qty: 90 | Fill #0

## 2015-06-26 MED FILL — SILDENAFIL 20 MG TABLET: 20 | 30 days supply | Qty: 30 | Fill #4

## 2015-08-08 MED FILL — SIMVASTATIN 20 MG TABLET: 20 | 90 days supply | Qty: 90 | Fill #1

## 2015-08-08 MED FILL — INVOKANA 300 MG TABLET: 300 | 90 days supply | Qty: 90 | Fill #2

## 2015-08-12 MED FILL — FLUCONAZOLE 200 MG TABLET: 200 | 6 days supply | Qty: 3 | Fill #0

## 2015-08-21 ENCOUNTER — Other Ambulatory Visit: Payer: Self-pay | Admitting: Emergency Medicine

## 2015-08-21 ENCOUNTER — Ambulatory Visit (INDEPENDENT_AMBULATORY_CARE_PROVIDER_SITE_OTHER): Payer: BLUE CROSS/BLUE SHIELD | Admitting: Emergency Medicine

## 2015-08-21 VITALS — BP 110/74 | HR 90 | Temp 98.2°F | Resp 18 | Ht 66.5 in | Wt 180.4 lb

## 2015-08-21 DIAGNOSIS — K219 Gastro-esophageal reflux disease without esophagitis: Secondary | ICD-10-CM | POA: Diagnosis not present

## 2015-08-21 DIAGNOSIS — E119 Type 2 diabetes mellitus without complications: Secondary | ICD-10-CM

## 2015-08-21 DIAGNOSIS — K140 Glossitis: Secondary | ICD-10-CM

## 2015-08-21 DIAGNOSIS — E7800 Pure hypercholesterolemia, unspecified: Secondary | ICD-10-CM | POA: Diagnosis not present

## 2015-08-21 DIAGNOSIS — Z125 Encounter for screening for malignant neoplasm of prostate: Secondary | ICD-10-CM | POA: Diagnosis not present

## 2015-08-21 DIAGNOSIS — I10 Essential (primary) hypertension: Secondary | ICD-10-CM | POA: Diagnosis not present

## 2015-08-21 LAB — POCT CBC
Granulocyte percent: 56.7 %G (ref 37–80)
HCT, POC: 47.2 % (ref 43.5–53.7)
Hemoglobin: 16.2 g/dL (ref 14.1–18.1)
Lymph, poc: 2.6 (ref 0.6–3.4)
MCH: 27 pg (ref 27–31.2)
MCHC: 34.4 g/dL (ref 31.8–35.4)
MCV: 78.5 fL — AB (ref 80–97)
MID (cbc): 0.3 (ref 0–0.9)
MPV: 10.1 fL (ref 0–99.8)
POC GRANULOCYTE: 3.8 (ref 2–6.9)
POC LYMPH PERCENT: 39.1 %L (ref 10–50)
POC MID %: 4.2 % (ref 0–12)
Platelet Count, POC: 169 10*3/uL (ref 142–424)
RBC: 6.01 M/uL (ref 4.69–6.13)
RDW, POC: 14.1 %
WBC: 6.7 10*3/uL (ref 4.6–10.2)

## 2015-08-21 LAB — COMPLETE METABOLIC PANEL WITH GFR
ALT: 17 U/L (ref 9–46)
AST: 14 U/L (ref 10–35)
Albumin: 4.7 g/dL (ref 3.6–5.1)
Alkaline Phosphatase: 71 U/L (ref 40–115)
BUN: 17 mg/dL (ref 7–25)
CALCIUM: 9.9 mg/dL (ref 8.6–10.3)
CHLORIDE: 106 mmol/L (ref 98–110)
CO2: 20 mmol/L (ref 20–31)
Creat: 0.88 mg/dL (ref 0.70–1.33)
Glucose, Bld: 99 mg/dL (ref 65–99)
POTASSIUM: 4.2 mmol/L (ref 3.5–5.3)
Sodium: 139 mmol/L (ref 135–146)
Total Bilirubin: 0.5 mg/dL (ref 0.2–1.2)
Total Protein: 7.5 g/dL (ref 6.1–8.1)

## 2015-08-21 LAB — LIPID PANEL
CHOL/HDL RATIO: 3.1 ratio (ref <=5.0)
Cholesterol: 147 mg/dL (ref 125–200)
HDL: 47 mg/dL (ref >=40)
LDL CALC: 81 mg/dL (ref <130)
TRIGLYCERIDES: 94 mg/dL (ref <150)
VLDL: 19 mg/dL (ref <30)

## 2015-08-21 LAB — POCT GLYCOSYLATED HEMOGLOBIN (HGB A1C): Hemoglobin A1C: 9.3

## 2015-08-21 LAB — GLUCOSE, POCT (MANUAL RESULT ENTRY): POC Glucose: 97 mg/dl (ref 70–99)

## 2015-08-21 LAB — POCT SKIN KOH: Skin KOH, POC: NEGATIVE

## 2015-08-21 MED ORDER — ESOMEPRAZOLE MAGNESIUM 20 MG PO CPDR: 20.0000 mg | DELAYED_RELEASE_CAPSULE | Freq: Every day | ORAL | Status: DC

## 2015-08-21 MED ORDER — SIMVASTATIN 20 MG PO TABS: 20.0000 mg | ORAL_TABLET | Freq: Every evening | ORAL | Status: DC

## 2015-08-21 MED ORDER — GLIPIZIDE 5 MG PO TABS: 5.0000 mg | ORAL_TABLET | Freq: Every day | ORAL | Status: DC

## 2015-08-21 MED ORDER — FIRST-DUKES MOUTHWASH MT SUSP: OROMUCOSAL | Status: DC

## 2015-08-21 MED ORDER — METFORMIN HCL 500 MG PO TABS: ORAL_TABLET | ORAL | Status: DC

## 2015-08-21 MED ORDER — CANAGLIFLOZIN 300 MG PO TABS
300.0000 mg | ORAL_TABLET | Freq: Every day | ORAL | Status: DC
Start: 2015-08-21 — End: 2016-09-02

## 2015-08-21 MED FILL — MAGIC MOUTHWASH BOP FORM: 6 days supply | Qty: 120 | Fill #0

## 2015-08-21 NOTE — Progress Notes (Addendum)
Patient ID: Ricardo Mills, male   DOB: 09/01/1955, 60 y.o.   MRN: [id number]    By signing my name below, I, [name], attest that this documentation has been prepared under the direction and in the presence of Collene GobbleSteven A Selin Eisler, MD Electronically Signed: Charline BillsEssence Howell, ED Scribe 08/21/2015 at 2:28 PM.  Chief Complaint:  Chief Complaint  Patient presents with  . fungus on tongue   HPI: Ricardo DeerSteve A Mills is a 60 y.o. male, with a h/o DM and HTN, who [facility] complaining of recurrent white spots on his tongue for the past year. Pt has been seen for the same last year and was given mouth wash which temporarily improved symptoms. He denies pain to the area. No other treatments tried PTA.   Past Medical History  Diagnosis Date  . Diabetes mellitus without complication (HCC)   . Hyperlipidemia   . Hypertension    Past Surgical History  Procedure Laterality Date  . Circumcision     Social History   Social History  . Marital Status: Married    Spouse Name: Estée LauderVirginia Michelle Chamblee  . Number of Children: 4  . Years of Education: 12   Occupational History  . 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 Smoking status: Never Smoker   . Smokeless tobacco: Never Used  . Alcohol Use: No     Comment: former alcohol use/ not currently  . Drug Use: No  . Sexual Activity:    Partners: Female   Other Topics Concern  . None   Social History Narrative   [facility]   Lives with his wife.   Regular exercise: occasionally   Caffeine use: coffee daily   Family History  Problem Relation Age of Onset  . Diabetes Mother   . Hyperlipidemia Mother   . Hypertension Mother   . Diabetes Father   . Cancer Sister [id number]    uterus?  . Diabetes Sister   . Hypertension Brother   . Diabetes Sister   . Diabetes Sister   . Diabetes Sister   . Diabetes Brother   . Hyperlipidemia Brother   . Hypertension Brother   .  Colon cancer Neg Hx    No Known Allergies Prior to Admission medications   Medication Sig Start Date End Date Taking? Authorizing Provider  canagliflozin (INVOKANA) 300 MG TABS tablet Take 300 mg by mouth daily. 11/19/14  Yes Thao P Le, DO  esomeprazole (NEXIUM) 20 MG capsule Take 1 capsule (20 mg total) by mouth daily at 12 noon. 02/21/15  Yes Stephanie D English, PA  glipiZIDE (GLUCOTROL) 5 MG tablet Take 1 tablet (5 mg total) by mouth daily before breakfast. Need to make sure you eat with this. 11/19/14  Yes Thao P Le, DO  glucose blood (ONE TOUCH ULTRA TEST) test strip 1 each by Other route daily. And lancets 1/day 250.02 03/09/13  Yes Romero BellingSean Ellison, MD  losartan (COZAAR) 100 MG tablet TAKE 1 TABLET BY MOUTH DAILY. 06/04/15  Yes Shade FloodJeffrey R Greene, MD  metFORMIN (GLUCOPHAGE) 500 MG tablet TAKE 2 TABLETS BY MOUTH 2 TIMES DAILY WITH A MEAL. 11/19/14  Yes Thao P Le, DO  simvastatin (ZOCOR) 20 MG tablet Take 1 tablet (20 mg total) by mouth every evening. 11/19/14  Yes Thao P Le, DO  ranitidine (ZANTAC) 150 MG tablet Take 1 tablet (150 mg total) by mouth 2 (two) times daily. Patient  not taking: Reported on 08/21/2015 02/21/15   Collie Siad English, PA   ROS: The patient denies fevers, chills, night sweats, unintentional weight loss, chest pain, palpitations, wheezing, dyspnea on exertion, nausea, vomiting, abdominal pain, dysuria, hematuria, melena, numbness, weakness, or tingling.   All other systems have been reviewed and were otherwise negative with the exception of those mentioned in the HPI and as above.    PHYSICAL EXAM: Filed Vitals:   08/21/15 1414  BP: 110/74  Pulse: 90  Temp: 98.2 F (36.8 C)  Resp: 18   Body mass index is 28.68 kg/(m^2).  General: Alert, no acute distress HEENT:  Normocephalic, atraumatic, oropharynx patent. Irregular borders around circular areas on his tongues consistent with geographical tongue.  Eye: Nonie Hoyer [facility] Cardiovascular: Regular rate and rhythm, no rubs  murmurs or gallops. No Carotid bruits, radial pulse intact. No pedal edema.  Respiratory: Clear to auscultation bilaterally. No wheezes, rales, or rhonchi. No cyanosis, no use of accessory musculature Abdominal: No organomegaly, abdomen is soft and non-tender, positive bowel sounds. No masses. Musculoskeletal: Gait intact. No edema, tenderness Skin: No rashes. Neurologic: Facial musculature symmetric. Psychiatric: Patient acts appropriately throughout our interaction. Lymphatic: No cervical or submandibular lymphadenopathy  LABS  Results for orders placed or performed in visit on 08/21/15  POCT Skin KOH  Result Value Ref Range   Skin KOH, POC Negative    Results for orders placed or performed in visit on 08/21/15  POCT Skin KOH  Result Value Ref Range   Skin KOH, POC Negative   POCT glucose (manual entry)  Result Value Ref Range   POC Glucose 97 70 - 99 mg/dl  POCT CBC  Result Value Ref Range   WBC 6.7 4.6 - 10.2 K/uL   Lymph, poc 2.6 0.6 - 3.4   POC LYMPH PERCENT 39.1 10 - 50 %L   MID (cbc) 0.3 0 - 0.9   POC MID % 4.2 0 - 12 %M   POC Granulocyte 3.8 2 - 6.9   Granulocyte percent 56.7 37 - 80 %G   RBC 6.01 4.69 - 6.13 M/uL   Hemoglobin 16.2 14.1 - 18.1 g/dL   HCT, POC 16.1 09.6 - 53.7 %   MCV 78.5 (A) 80 - 97 fL   MCH, POC 27.0 27 - 31.2 pg   MCHC 34.4 31.8 - 35.4 g/dL   RDW, POC 04.5 %   Platelet Count, POC 169 142 - 424 K/uL   MPV 10.1 0 - 99.8 fL  POCT glycosylated hemoglobin (Hb A1C)  Result Value Ref Range   Hemoglobin A1C 9.3     Meds ordered this encounter  Medications  . Diphenhyd-Hydrocort-Nystatin (FIRST-DUKES MOUTHWASH) SUSP    Sig: 1 teaspoon as rinse gargle and spit 4 times a day    Dispense:  120 mL    Refill:  1  . simvastatin (ZOCOR) 20 MG tablet    Sig: Take 1 tablet (20 mg total) by mouth every evening.    Dispense:  90 tablet    Refill:  3  . metFORMIN (GLUCOPHAGE) 500 MG tablet    Sig: TAKE 2 TABLETS BY MOUTH 2 TIMES DAILY WITH A MEAL.     Dispense:  360 tablet    Refill:  3  . glipiZIDE (GLUCOTROL) 5 MG tablet    Sig: Take 1 tablet (5 mg total) by mouth daily before breakfast. Need to make sure you eat with this.    Dispense:  90 tablet    Refill:  3  .  esomeprazole (NEXIUM) 20 MG capsule    Sig: Take 1 capsule (20 mg total) by mouth daily at 12 noon.    Dispense:  30 capsule    Refill:  11  . canagliflozin (INVOKANA) 300 MG TABS tablet    Sig: Take 1 tablet (300 mg total) by mouth daily.    Dispense:  90 tablet    Refill:  3    EKG/XRAY:   Primary read interpreted by Dr. Cleta Alberts at [facility].  ASSESSMENT/PLAN: Patient has a geographic tongue. Will treat with [facility]'s mouthwash with refill.I personally performed the services described in this documentation, which was scribed in my presence. The recorded information has been reviewed and is accurate. Meds were refilled. He was encouraged to take his medications regular. Recheck 3-4 months for repeat hemoglobin A1c.His hemoglobin A1c was up to 9.3. This is up almost 2 points. He will be called to encourage him to take his medications regularly and to repeat his testing in 3 months.    Gross sideeffects, risk and benefits, and alternatives of medications d/w patient. Patient is aware that all medications have potential sideeffects and we are unable to predict every sideeffect or drug-drug interaction that may occur.  Lesle Chris MD 08/21/2015 2:21 PM

## 2015-08-21 NOTE — Patient Instructions (Addendum)
He had been diagnosed with a geographic tongue.    IF you received an x-ray today, you will receive an invoice from [facility]. Please contact [facility] at [phone] with questions or concerns regarding your invoice.   IF you received labwork today, you will receive an invoice from [facility]. Please contact [facility] at [phone] with questions or concerns regarding your invoice.   Our billing staff will not be able to assist you with questions regarding bills from these companies.  You will be contacted with the lab results as soon as they are available. The fastest way to get your results is to activate your My Chart account. Instructions are located on the last page of this paperwork. If you have not heard from us regarding the results in 2 weeks, please contact this office.

## 2015-08-22 ENCOUNTER — Telehealth: Payer: Self-pay | Admitting: Emergency Medicine

## 2015-08-22 LAB — PSA: PSA: 0.96 ng/mL (ref <=4.00)

## 2015-08-22 LAB — MICROALBUMIN, URINE: Microalb, Ur: 0.4 mg/dL

## 2015-08-22 NOTE — Telephone Encounter (Signed)
Call Mr. Ricardo Mills and  let him know to work on  his diet and focus on weight loss. Please be sure he takes his medication regular. All of his medications have been refilled. His hemoglobin A1c is up 2 points. He needs recheck in 3 months.

## 2015-08-23 NOTE — Telephone Encounter (Signed)
Notes Recorded by Elvina SidleKurt Lauenstein, MD on 08/14/2015 at 1:04 PM Patient has abnormal lab values. Her parathyroid levels are elevated. I we referring her to an endocrinologist. Her calcium levels now have returned to normal.

## 2015-08-24 LAB — HEMOGLOBIN A1C
HEMOGLOBIN A1C: 8.6 % — AB (ref <5.7)
MEAN PLASMA GLUCOSE: 200 mg/dL

## 2015-08-27 MED FILL — metFORMIN HCL 500 MG TABS: 500 | 90 days supply | Qty: 360 | Fill #2

## 2015-08-27 MED FILL — glipiZIDE 5 MG TABS: 5 | 90 days supply | Qty: 90 | Fill #3

## 2015-08-27 MED FILL — SILDENAFIL 20 MG TABLET: 20 | 30 days supply | Qty: 30 | Fill #5

## 2015-09-05 MED FILL — LOSARTAN POTASSIUM 100 MG T: 100 | 90 days supply | Qty: 90 | Fill #1

## 2015-11-01 MED FILL — SILDENAFIL 20 MG TABLET: 20 | 30 days supply | Qty: 30 | Fill #6

## 2015-11-11 MED FILL — INVOKANA 300 MG TABLET: 300 | 90 days supply | Qty: 90 | Fill #0

## 2015-11-11 MED FILL — SIMVASTATIN 20 MG TABLET: 20 | 90 days supply | Qty: 90 | Fill #2

## 2015-11-21 MED FILL — metFORMIN HCL 500 MG TABS: 500 | 90 days supply | Qty: 360 | Fill #0

## 2015-12-09 MED FILL — LOSARTAN POTASSIUM 100 MG T: 100 | 90 days supply | Qty: 90 | Fill #2

## 2015-12-09 MED FILL — glipiZIDE 5 MG TABS: 5 | 90 days supply | Qty: 90 | Fill #0

## 2016-01-28 ENCOUNTER — Encounter: Payer: Self-pay | Admitting: Endocrinology

## 2016-01-28 ENCOUNTER — Ambulatory Visit (INDEPENDENT_AMBULATORY_CARE_PROVIDER_SITE_OTHER): Payer: 59 | Admitting: Endocrinology

## 2016-01-28 VITALS — BP 122/82 | HR 75 | Ht 66.5 in | Wt 178.0 lb

## 2016-01-28 DIAGNOSIS — N529 Male erectile dysfunction, unspecified: Secondary | ICD-10-CM | POA: Diagnosis not present

## 2016-01-28 DIAGNOSIS — E119 Type 2 diabetes mellitus without complications: Secondary | ICD-10-CM | POA: Diagnosis not present

## 2016-01-28 LAB — TSH: TSH: 0.99 u[IU]/mL (ref 0.35–4.50)

## 2016-01-28 LAB — HEMOGLOBIN A1C: Hgb A1c MFr Bld: 7.2 % — ABNORMAL HIGH (ref 4.6–6.5)

## 2016-01-28 NOTE — Patient Instructions (Signed)
blood tests are requested for you today.  We'll let you know about the results.  check your blood sugar once a day.  vary the time of day when you check, between before the 3 meals, and at bedtime.  also check if you have symptoms of your blood sugar being too high or too low.  please keep a record of the readings and bring it to your next appointment here (or you can bring the meter itself).  You can write it on any piece of paper.  please call us sooner if your blood sugar goes below 70, or if you have a lot of readings over 200.  Please come back for a follow-up appointment in 2 months.

## 2016-01-28 NOTE — Progress Notes (Signed)
Subjective:    Patient ID: Ricardo Mills, male    DOB: 22-Jan-1956, 60 y.o.   MRN: [id number]  HPI Pt returns for f/u of diabetes mellitus: DM type: 2 Dx'ed: 1999 Complications: none Therapy: 2 oral meds.   DKA: never Severe hypoglycemia: never Pancreatitis: never Other: he has never been on insulin Interval history: pt states he feels well in general.  He says cbg's are well-controlled.   Past Medical History:  Diagnosis Date  . Diabetes mellitus without complication (HCC)   . Hyperlipidemia   . Hypertension     Past Surgical History:  Procedure Laterality Date  . CIRCUMCISION      Social History   Social History  . Marital status: Married    Spouse name: Estée Lauder  . Number of children: 4  . Years of education: 12   Occupational History  . 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 Smoking status: Never Smoker  . Smokeless tobacco: Never Used  . Alcohol use No     Comment: former alcohol use/ not currently  . Drug use: No  . Sexual activity: Yes    Partners: Female   Other Topics Concern  . Not on file   Social History Narrative   [employer] Parents   Lives with his wife.   Regular exercise: occasionally   Caffeine use: coffee daily    Current Outpatient Prescriptions on File Prior to Visit  Medication Sig Dispense Refill  . canagliflozin (INVOKANA) 300 MG TABS tablet Take 1 tablet (300 mg total) by mouth daily. 90 tablet 3  . Diphenhyd-Hydrocort-Nystatin (FIRST-DUKES MOUTHWASH) SUSP 1 teaspoon as rinse gargle and spit 4 times a day 120 mL 1  . esomeprazole (NEXIUM) 20 MG capsule Take 1 capsule (20 mg total) by mouth daily at 12 noon. 30 capsule 11  . glipiZIDE (GLUCOTROL) 5 MG tablet Take 1 tablet (5 mg total) by mouth daily before breakfast. Need to make sure you eat with this. 90 tablet 3  . glucose blood (ONE TOUCH ULTRA TEST) test strip 1 each by Other route daily. And  lancets 1/day 250.02 100 each 12  . losartan (COZAAR) 100 MG tablet TAKE 1 TABLET BY MOUTH DAILY. 90 tablet PRN  . metFORMIN (GLUCOPHAGE) 500 MG tablet TAKE 2 TABLETS BY MOUTH 2 TIMES DAILY WITH A MEAL. 360 tablet 3  . ranitidine (ZANTAC) 150 MG tablet Take 1 tablet (150 mg total) by mouth 2 (two) times daily. 60 tablet 1  . simvastatin (ZOCOR) 20 MG tablet Take 1 tablet (20 mg total) by mouth every evening. 90 tablet 3   No current facility-administered medications on file prior to visit.     No Known Allergies  Family History  Problem Relation Age of Onset  . Diabetes Mother   . Hyperlipidemia Mother   . Hypertension Mother   . Diabetes Father   . Cancer Sister 57    uterus?  . Diabetes Sister   . Hypertension Brother   . Diabetes Sister   . Diabetes Sister   . Diabetes Sister   . Diabetes Brother   . Hyperlipidemia Brother   . Hypertension Brother   . Colon cancer Neg Hx     BP 122/82   Pulse 75   Ht 5' 6.5" (1.689 m)   Wt 178 lb (80.7 kg)   SpO2 97%   BMI 28.30 kg/m   Review of Systems He  has ED symptoms--viagra does not help.  He denies hypoglycemia.       Objective:   Physical Exam VITAL SIGNS:  See vs page GENERAL: no distress Pulses: dorsalis pedis intact bilat.   MSK: no deformity of the feet CV: no leg edema Skin:  no ulcer on the feet.  normal color and temp on the feet. Neuro: sensation is intact to touch on the feet.  Ext: There is onychomycosis of the right great toenail.    Lab Results  Component Value Date   CREATININE 0.88 08/21/2015   BUN 17 08/21/2015   NA 139 08/21/2015   K 4.2 08/21/2015   CL 106 08/21/2015   CO2 20 08/21/2015   Lab Results  Component Value Date   PSA 0.96 08/21/2015   PSA 0.91 08/21/2013   PSA 0.68 06/21/2011   Lab Results  Component Value Date   HGBA1C 7.2 (H) 01/28/2016   Lab Results  Component Value Date   TESTOSTERONE 546 01/28/2016       Assessment & Plan:  ED: not related to hypogonadism.  Type  2 DM: improved, but he needs increased rx, if it can be done with a regimen that avoids or minimizes hypoglycemia.  I advised him to change the glipizide to another med

## 2016-01-30 ENCOUNTER — Telehealth: Payer: Self-pay | Admitting: Endocrinology

## 2016-01-30 LAB — TESTOSTERONE,FREE AND TOTAL
TESTOSTERONE FREE: 11.5 pg/mL (ref 7.2–24.0)
TESTOSTERONE: 546 ng/dL (ref 264–916)

## 2016-01-30 NOTE — Telephone Encounter (Signed)
Patient ask you to call.

## 2016-01-30 NOTE — Telephone Encounter (Signed)
Pt returning the call

## 2016-01-31 MED ORDER — SITAGLIPTIN PHOSPHATE 100 MG PO TABS: 100.0000 mg | ORAL_TABLET | Freq: Every day | ORAL | 3 refills | Status: DC

## 2016-01-31 NOTE — Telephone Encounter (Signed)
I contacted the patient and advised of recent results regarding A1C and Testosterone. Patient agreed to changing glipizide. Dr. Everardo AllEllison notified via result note.

## 2016-01-31 NOTE — Addendum Note (Signed)
Addended by: Romero BellingELLISON, Koda Defrank on: 01/31/2016 01:48 PM   Modules accepted: Orders

## 2016-02-04 ENCOUNTER — Telehealth: Payer: Self-pay | Admitting: Endocrinology

## 2016-02-04 MED ORDER — SAXAGLIPTIN HCL 5 MG PO TABS: 5.0000 mg | ORAL_TABLET | Freq: Every day | ORAL | 11 refills | Status: DC

## 2016-02-04 MED FILL — ONGLYZA 5 MG TABLET: 5 | 30 days supply | Qty: 30 | Fill #0

## 2016-02-04 NOTE — Telephone Encounter (Signed)
I contacted the patient and advised of message. Patient voiced understanding and had no further questions at this time.  

## 2016-02-04 NOTE — Telephone Encounter (Signed)
please call patient: Ins wants you to change januvia to onglyza.  They are similar.  I have sent a prescription to your pharmacy

## 2016-02-18 ENCOUNTER — Other Ambulatory Visit: Payer: Self-pay | Admitting: Endocrinology

## 2016-02-18 MED FILL — SILDENAFIL 20 MG TABLET: 20 | 30 days supply | Qty: 30 | Fill #0

## 2016-02-18 MED FILL — INVOKANA 300 MG TABLET: 300 | 90 days supply | Qty: 90 | Fill #1

## 2016-02-18 MED FILL — SIMVASTATIN 20 MG TABLET: 20 | 90 days supply | Qty: 90 | Fill #0

## 2016-02-24 MED FILL — metFORMIN HCL 500 MG TABS: 500 | 90 days supply | Qty: 360 | Fill #1

## 2016-03-12 MED FILL — glipiZIDE 5 MG TABS: 5 | 90 days supply | Qty: 90 | Fill #1

## 2016-03-12 MED FILL — LOSARTAN POTASSIUM 100 MG T: 100 | 90 days supply | Qty: 90 | Fill #3

## 2016-03-29 NOTE — Progress Notes (Signed)
   Subjective:    Patient ID: Ricardo Mills, male    DOB: [birth year], 58 y.o.   MRN: [id number]  HPI Pt returns for f/u of diabetes mellitus: DM type: 2 Dx'ed: 1999 Complications: none Therapy: 3 oral meds.   DKA: never Severe hypoglycemia: never Pancreatitis: never Other: he has never been on insulin Interval history: pt states he feels well in general.  He says cbg's are well-controlled.  He does not take onglyza.   Review of Systems He denies hypoglycemia.  ED sxs persist    Objective:   Physical Exam VITAL SIGNS:  See vs page GENERAL: no distress Pulses: dorsalis pedis intact bilat.   MSK: no deformity of the feet CV: no leg edema Skin:  no ulcer on the feet.  normal color and temp on the feet. Neuro: sensation is intact to touch on the feet.  Ext: There is onychomycosis of the right great toenail.     A1c=7.0%    Assessment & Plan:  Type 2 DM: he needs increased rx, if it can be done with a regimen that avoids or minimizes hypoglycemia. ED, persistent Noncompliance with medication.

## 2016-03-30 ENCOUNTER — Encounter: Payer: Self-pay | Admitting: Endocrinology

## 2016-03-30 ENCOUNTER — Ambulatory Visit (INDEPENDENT_AMBULATORY_CARE_PROVIDER_SITE_OTHER): Payer: BLUE CROSS/BLUE SHIELD | Admitting: Endocrinology

## 2016-03-30 VITALS — BP 124/72 | HR 63 | Wt 177.0 lb

## 2016-03-30 DIAGNOSIS — N529 Male erectile dysfunction, unspecified: Secondary | ICD-10-CM

## 2016-03-30 DIAGNOSIS — E119 Type 2 diabetes mellitus without complications: Secondary | ICD-10-CM | POA: Diagnosis not present

## 2016-03-30 LAB — POCT GLYCOSYLATED HEMOGLOBIN (HGB A1C): HEMOGLOBIN A1C: 7

## 2016-03-30 MED ORDER — SAXAGLIPTIN HCL 5 MG PO TABS: 5.0000 mg | ORAL_TABLET | Freq: Every day | ORAL | 11 refills | Status: DC

## 2016-03-30 NOTE — Patient Instructions (Addendum)
Please see a urology specialist.  you will receive a phone call, about a day and time for an appointment Please resume the onglyza.  I have sent a prescription to your pharmacy.   check your blood sugar once a day.  vary the time of day when you check, between before the 3 meals, and at bedtime.  also check if you have symptoms of your blood sugar being too high or too low.  please keep a record of the readings and bring it to your next appointment here (or you can bring the meter itself).  You can write it on any piece of paper.  please call us sooner if your blood sugar goes below 70, or if you have a lot of readings over 200.   Please come back for a follow-up appointment in 3 months.

## 2016-03-30 NOTE — Addendum Note (Signed)
Addended by: Ann MakiBAILEY, Aubreanna Percle T on: 03/30/2016 11:07 AM   Modules accepted: Orders

## 2016-05-08 MED FILL — SILDENAFIL 20 MG TABLET: 20 | 30 days supply | Qty: 30 | Fill #1

## 2016-05-20 MED FILL — SIMVASTATIN 20 MG TABLET: 20 | 90 days supply | Qty: 90 | Fill #1

## 2016-05-20 MED FILL — metFORMIN HCL 500 MG TABS: 500 | 90 days supply | Qty: 360 | Fill #2

## 2016-05-28 MED FILL — LOSARTAN POTASSIUM 100 MG T: 100 | 90 days supply | Qty: 90 | Fill #4

## 2016-05-28 MED FILL — INVOKANA 300 MG TABLET: 300 | 90 days supply | Qty: 90 | Fill #2

## 2016-06-17 MED FILL — glipiZIDE 5 MG TABS: 5 | 90 days supply | Qty: 90 | Fill #2

## 2016-06-24 MED FILL — SILDENAFIL 20 MG TABLET: 20 | 30 days supply | Qty: 30 | Fill #2

## 2016-08-11 MED FILL — SILDENAFIL 20 MG TABLET: 20 | 30 days supply | Qty: 30 | Fill #3

## 2016-08-25 ENCOUNTER — Other Ambulatory Visit: Payer: Self-pay

## 2016-08-25 DIAGNOSIS — E7800 Pure hypercholesterolemia, unspecified: Secondary | ICD-10-CM

## 2016-08-25 MED ORDER — SIMVASTATIN 20 MG PO TABS: 20.0000 mg | ORAL_TABLET | Freq: Every evening | ORAL | 0 refills | Status: DC

## 2016-08-25 MED FILL — SIMVASTATIN 20 MG TABLET: 20 | 30 days supply | Qty: 30 | Fill #0

## 2016-09-02 ENCOUNTER — Other Ambulatory Visit: Payer: Self-pay | Admitting: Emergency Medicine

## 2016-09-02 DIAGNOSIS — E119 Type 2 diabetes mellitus without complications: Secondary | ICD-10-CM

## 2016-09-02 MED FILL — INVOKANA 300 MG TABLET: 300 | 30 days supply | Qty: 30 | Fill #0

## 2016-09-02 MED FILL — metFORMIN HCL 500 MG TABS: 500 | 30 days supply | Qty: 120 | Fill #0

## 2016-09-16 ENCOUNTER — Other Ambulatory Visit: Payer: Self-pay | Admitting: Family Medicine

## 2016-09-18 ENCOUNTER — Ambulatory Visit (INDEPENDENT_AMBULATORY_CARE_PROVIDER_SITE_OTHER): Payer: BLUE CROSS/BLUE SHIELD | Admitting: Urgent Care

## 2016-09-18 ENCOUNTER — Encounter: Payer: Self-pay | Admitting: Urgent Care

## 2016-09-18 VITALS — BP 132/81 | HR 71 | Temp 98.4°F | Resp 17 | Ht 66.5 in | Wt 178.0 lb

## 2016-09-18 DIAGNOSIS — E7800 Pure hypercholesterolemia, unspecified: Secondary | ICD-10-CM

## 2016-09-18 DIAGNOSIS — E119 Type 2 diabetes mellitus without complications: Secondary | ICD-10-CM | POA: Diagnosis not present

## 2016-09-18 DIAGNOSIS — E782 Mixed hyperlipidemia: Secondary | ICD-10-CM | POA: Diagnosis not present

## 2016-09-18 DIAGNOSIS — I10 Essential (primary) hypertension: Secondary | ICD-10-CM | POA: Diagnosis not present

## 2016-09-18 DIAGNOSIS — N529 Male erectile dysfunction, unspecified: Secondary | ICD-10-CM | POA: Diagnosis not present

## 2016-09-18 LAB — POCT GLYCOSYLATED HEMOGLOBIN (HGB A1C): HEMOGLOBIN A1C: 7.2

## 2016-09-18 MED ORDER — LOSARTAN POTASSIUM 100 MG PO TABS: 100.0000 mg | ORAL_TABLET | Freq: Every day | ORAL | 3 refills | Status: DC

## 2016-09-18 MED ORDER — METFORMIN HCL 1000 MG PO TABS: 1000.0000 mg | ORAL_TABLET | Freq: Two times a day (BID) | ORAL | 3 refills | Status: DC

## 2016-09-18 MED ORDER — SILDENAFIL CITRATE 20 MG PO TABS: 40.0000 mg | ORAL_TABLET | ORAL | 1 refills | Status: DC | PRN

## 2016-09-18 MED ORDER — SIMVASTATIN 20 MG PO TABS: 20.0000 mg | ORAL_TABLET | Freq: Every evening | ORAL | 3 refills | Status: DC

## 2016-09-18 MED ORDER — SAXAGLIPTIN HCL 5 MG PO TABS: 5.0000 mg | ORAL_TABLET | Freq: Every day | ORAL | 3 refills | Status: DC

## 2016-09-18 MED FILL — LOSARTAN POTASSIUM 100 MG T: 100 | 90 days supply | Qty: 90 | Fill #0

## 2016-09-18 MED FILL — SIMVASTATIN 20 MG TABLET: 20 | 90 days supply | Qty: 90 | Fill #0

## 2016-09-18 MED FILL — SILDENAFIL 20 MG TABLET: 20 | 15 days supply | Qty: 30 | Fill #0

## 2016-09-18 MED FILL — metFORMIN HCL 1000 MG TABS: 1000 | 90 days supply | Qty: 180 | Fill #0

## 2016-09-18 NOTE — Progress Notes (Signed)
MRN: [id number] DOB: 10-Jun-1955  Subjective:   Ricardo Mills is a 61 y.o. male presenting for chief complaint of Medication Refill (invokana, cozaar, metformin, sildenafil, zocor)  DM - Managed with Metformin 500mg  BID, Invokana and Onglyza. Diet is non-compliant. Exercises daily. Patient denies blurred vision, polydipsia, chest pain, nausea, vomiting, abdominal pain, hematuria, polyuria, skin infections, numbness or tingling. Checks feet daily, is currently using an otc Athlete's Foot cream. Last diabetic eye exam was 2 years ago, plans on setting up an office visit for this again next month. Denies smoking cigarettes or drinking alcohol.   HTN - Managed with losartan. Does not add salt.   HL - Managed with simvastatin. Does not eat fatty, greasy meals.   ED - Managed with sildenafil 40mg  once per week. He has not seen a cardiologist for screen of heart disease. He is not opposed to this.  Ricardo Mills has a current medication list which includes the following prescription(s): first-dukes mouthwash, esomeprazole, glucose blood, invokana, losartan, metformin, ranitidine, saxagliptin hcl, sildenafil, and simvastatin. Also has No Known Allergies.  Ricardo Mills  has a past medical history of Diabetes mellitus without complication (HCC); Hyperlipidemia; and Hypertension. Also  has a past surgical history that includes Circumcision.  Objective:   Vitals: BP 132/81 (BP Location: Right Arm, Patient Position: Sitting, Cuff Size: Normal)   Pulse 71   Temp 98.4 F (36.9 C) (Oral)   Resp 17   Ht 5' 6.5" (1.689 m)   Wt 178 lb (80.7 kg)   SpO2 95%   BMI 28.30 kg/m   BP Readings from Last 3 Encounters:  09/18/16 132/81  03/30/16 124/72  01/28/16 122/82   Physical Exam  Constitutional: He is oriented to person, place, and time. He appears well-developed and well-nourished.  HENT:  Mouth/Throat: Oropharynx is clear and moist.  Eyes: No scleral icterus.  Neck: Normal range of motion. Neck supple. No  thyromegaly present.  Cardiovascular: Normal rate, regular rhythm and intact distal pulses.  Exam reveals no gallop and no friction rub.   No murmur heard. Pulmonary/Chest: No respiratory distress. He has no wheezes. He has no rales.  Abdominal: Soft. Bowel sounds are normal. He exhibits no distension and no mass. There is no tenderness. There is no guarding.  Musculoskeletal: He exhibits no edema.  Neurological: He is alert and oriented to person, place, and time.  Skin: Skin is warm and dry. Capillary refill takes less than 2 seconds.  Psychiatric: He has a normal mood and affect.   Results for orders placed or performed in visit on 09/18/16 (from the past 24 hour(s))  POCT glycosylated hemoglobin (Hb A1C)     Status: None   Collection Time: 09/18/16 12:09 PM  Result Value Ref Range   Hemoglobin A1C 7.2    Assessment and Plan :   1. Controlled type 2 diabetes mellitus without complication, without long-term current use of insulin (HCC) - Well controlled. Stop Invokana. Increase Metformin to 1,000mg  BID, refilled Onglyza. Recommended dietary modifications, recheck in 3 months. - POCT glycosylated hemoglobin (Hb A1C) - Comprehensive metabolic panel - Microalbumin/Creatinine Ratio, Urine - HM DIABETES FOOT EXAM  2. Mixed hyperlipidemia - Refilled pravastatin, well controlled - Lipid panel pending  3. Essential hypertension - Well controlled, labs pending. Refills provided for 1 year. - Microalbumin/Creatinine Ratio, Urine - losartan (COZAAR) 100 MG tablet; Take 1 tablet (100 mg total) by mouth daily.  Dispense: 90 tablet; Refill: 3  4. High cholesterol - Well controlled, labs pending. Refills provided for 1 year. -  simvastatin (ZOCOR) 20 MG tablet; Take 1 tablet (20 mg total) by mouth every evening.  Dispense: 90 tablet; Refill: 3  5. Erectile dysfunction, unspecified erectile dysfunction type - Stable, refill provided. Will discuss referral to cardiologist at f/u - sildenafil  (REVATIO) 20 MG tablet; Take 2 tablets (40 mg total) by mouth as needed.  Dispense: 30 tablet; Refill: 1   Wallis Bamberg, PA-C [facility] [phone] 09/18/2016  12:12 PM

## 2016-09-18 NOTE — Patient Instructions (Addendum)
Diabetes Mellitus and Food It is important for you to manage your blood sugar (glucose) level. Your blood glucose level can be greatly affected by what you eat. Eating healthier foods in the appropriate amounts throughout the day at about the same time each day will help you control your blood glucose level. It can also help slow or prevent worsening of your diabetes mellitus. Healthy eating may even help you improve the level of your blood pressure and reach or maintain a healthy weight. General recommendations for healthful eating and cooking habits include:  Eating meals and snacks regularly. Avoid going long periods of time without eating to lose weight.  Eating a diet that consists mainly of plant-based foods, such as fruits, vegetables, nuts, legumes, and whole grains.  Using low-heat cooking methods, such as baking, instead of high-heat cooking methods, such as deep frying.  Work with your dietitian to make sure you understand how to use the Nutrition Facts information on food labels. How can food affect me? Carbohydrates Carbohydrates affect your blood glucose level more than any other type of food. Your dietitian will help you determine how many carbohydrates to eat at each meal and teach you how to count carbohydrates. Counting carbohydrates is important to keep your blood glucose at a healthy level, especially if you are using insulin or taking certain medicines for diabetes mellitus. Alcohol Alcohol can cause sudden decreases in blood glucose (hypoglycemia), especially if you use insulin or take certain medicines for diabetes mellitus. Hypoglycemia can be a life-threatening condition. Symptoms of hypoglycemia (sleepiness, dizziness, and disorientation) are similar to symptoms of having too much alcohol. If your health care provider has given you approval to drink alcohol, do so in moderation and use the following guidelines:  Women should not have more than one drink per day, and men  should not have more than two drinks per day. One drink is equal to: ? 12 oz of beer. ? 5 oz of wine. ? 1 oz of hard liquor.  Do not drink on an empty stomach.  Keep yourself hydrated. Have water, diet soda, or unsweetened iced tea.  Regular soda, juice, and other mixers might contain a lot of carbohydrates and should be counted.  What foods are not recommended? As you make food choices, it is important to remember that all foods are not the same. Some foods have fewer nutrients per serving than other foods, even though they might have the same number of calories or carbohydrates. It is difficult to get your body what it needs when you eat foods with fewer nutrients. Examples of foods that you should avoid that are high in calories and carbohydrates but low in nutrients include:  Trans fats (most processed foods list trans fats on the Nutrition Facts label).  Regular soda.  Juice.  Candy.  Sweets, such as cake, pie, doughnuts, and cookies.  Fried foods.  What foods can I eat? Eat nutrient-rich foods, which will nourish your body and keep you healthy. The food you should eat also will depend on several factors, including:  The calories you need.  The medicines you take.  Your weight.  Your blood glucose level.  Your blood pressure level.  Your cholesterol level.  You should eat a variety of foods, including:  Protein. ? Lean cuts of meat. ? Proteins low in saturated fats, such as fish, egg whites, and beans. Avoid processed meats.  Fruits and vegetables. ? Fruits and vegetables that may help control blood glucose levels, such as apples,   mangoes, and yams.  Dairy products. ? Choose fat-free or low-fat dairy products, such as milk, yogurt, and cheese.  Grains, bread, pasta, and rice. ? Choose whole grain products, such as multigrain bread, whole oats, and brown rice. These foods may help control blood pressure.  Fats. ? Foods containing healthful fats, such as  nuts, avocado, olive oil, canola oil, and fish.  Does everyone with diabetes mellitus have the same meal plan? Because every person with diabetes mellitus is different, there is not one meal plan that works for everyone. It is very important that you meet with a dietitian who will help you create a meal plan that is just right for you. This information is not intended to replace advice given to you by your health care provider. Make sure you discuss any questions you have with your health care provider. Document Released: 01/22/2005 Document Revised: 10/03/2015 Document Reviewed: 03/24/2013 Elsevier Interactive Patient Education  2017 Elsevier Inc.    IF you received an x-ray today, you will receive an invoice from [facility]. Please contact [facility] at [phone] with questions or concerns regarding your invoice.   IF you received labwork today, you will receive an invoice from [facility]. Please contact [facility] at [phone] with questions or concerns regarding your invoice.   Our billing staff will not be able to assist you with questions regarding bills from these companies.  You will be contacted with the lab results as soon as they are available. The fastest way to get your results is to activate your My Chart account. Instructions are located on the last page of this paperwork. If you have not heard from us regarding the results in 2 weeks, please contact this office.      

## 2016-09-19 LAB — LIPID PANEL
CHOLESTEROL TOTAL: 156 mg/dL (ref 100–199)
Chol/HDL Ratio: 2.8 ratio (ref 0.0–5.0)
HDL: 55 mg/dL (ref >39)
LDL Calculated: 90 mg/dL (ref 0–99)
Triglycerides: 57 mg/dL (ref 0–149)
VLDL CHOLESTEROL CAL: 11 mg/dL (ref 5–40)

## 2016-09-19 LAB — COMPREHENSIVE METABOLIC PANEL
A/G RATIO: 1.8 (ref 1.2–2.2)
ALK PHOS: 66 IU/L (ref 39–117)
ALT: 21 IU/L (ref 0–44)
AST: 16 IU/L (ref 0–40)
Albumin: 4.9 g/dL (ref 3.5–5.5)
BILIRUBIN TOTAL: 0.4 mg/dL (ref 0.0–1.2)
BUN / CREAT RATIO: 11 (ref 9–20)
BUN: 11 mg/dL (ref 6–24)
CO2: 23 mmol/L (ref 18–29)
Calcium: 9.7 mg/dL (ref 8.7–10.2)
Chloride: 102 mmol/L (ref 96–106)
Creatinine, Ser: 1.04 mg/dL (ref 0.76–1.27)
GFR calc Af Amer: 91 mL/min/{1.73_m2} (ref >59)
GFR calc non Af Amer: 79 mL/min/{1.73_m2} (ref >59)
GLUCOSE: 83 mg/dL (ref 65–99)
Globulin, Total: 2.7 g/dL (ref 1.5–4.5)
Potassium: 4.1 mmol/L (ref 3.5–5.2)
Sodium: 141 mmol/L (ref 134–144)
Total Protein: 7.6 g/dL (ref 6.0–8.5)

## 2016-09-19 LAB — MICROALBUMIN / CREATININE URINE RATIO
Creatinine, Urine: 100.3 mg/dL
MICROALBUM., U, RANDOM: 4.1 ug/mL
Microalb/Creat Ratio: 4.1 mg/g creat (ref 0.0–30.0)

## 2016-09-25 ENCOUNTER — Other Ambulatory Visit: Payer: Self-pay | Admitting: Emergency Medicine

## 2016-09-25 DIAGNOSIS — E119 Type 2 diabetes mellitus without complications: Secondary | ICD-10-CM

## 2016-09-28 ENCOUNTER — Other Ambulatory Visit: Payer: Self-pay | Admitting: Emergency Medicine

## 2016-09-28 DIAGNOSIS — E119 Type 2 diabetes mellitus without complications: Secondary | ICD-10-CM

## 2016-09-28 NOTE — Telephone Encounter (Signed)
Per my notes from his last office visit, patient is not on glipizide. Last a1c was 7.2. We increased his metformin to 1,000mg  BID. He is also on saxagliptin. Therefore, patient does not need refill of glipizide due to risk of hypoglycemia and good diabetic control. Recheck in 3 months.

## 2016-09-29 MED FILL — glipiZIDE 5 MG TABS: 5 | 90 days supply | Qty: 90 | Fill #0

## 2016-11-18 ENCOUNTER — Encounter: Payer: Self-pay | Admitting: Family Medicine

## 2016-11-18 ENCOUNTER — Ambulatory Visit (INDEPENDENT_AMBULATORY_CARE_PROVIDER_SITE_OTHER): Payer: BLUE CROSS/BLUE SHIELD | Admitting: Family Medicine

## 2016-11-18 ENCOUNTER — Ambulatory Visit (INDEPENDENT_AMBULATORY_CARE_PROVIDER_SITE_OTHER): Payer: BLUE CROSS/BLUE SHIELD

## 2016-11-18 VITALS — BP 157/80 | HR 60 | Temp 98.5°F | Resp 16 | Ht 66.5 in | Wt 180.6 lb

## 2016-11-18 DIAGNOSIS — I10 Essential (primary) hypertension: Secondary | ICD-10-CM

## 2016-11-18 DIAGNOSIS — R079 Chest pain, unspecified: Secondary | ICD-10-CM

## 2016-11-18 DIAGNOSIS — M5412 Radiculopathy, cervical region: Secondary | ICD-10-CM

## 2016-11-18 DIAGNOSIS — M5090 Cervical disc disorder, unspecified, unspecified cervical region: Secondary | ICD-10-CM | POA: Diagnosis not present

## 2016-11-18 DIAGNOSIS — E119 Type 2 diabetes mellitus without complications: Secondary | ICD-10-CM | POA: Diagnosis not present

## 2016-11-18 NOTE — Patient Instructions (Addendum)
The neck has significant arthritis, which has caused narrowing of the disc spaces and of the openings where the nerves exit the spine. This pinches on the nerve to your arm causing the pains down your arm. There is not anything easy that can be offered for this. If you're having pain you can take 400 600 mg of ibuprofen a couple of times a day. If the pain gets significantly worse, we can send you to a neck and spine specialist, but that should not be done in last you're having constant problems.  The chest pain is from your chest wall. If it should change in nature and become a heavy pressure-like pain that occurs when you exert, the you should get rechecked with other testing done. In the event of severe chest pain at anytime go straight to the emergency room or call 911.  Return as needed    IF you received an x-ray today, you will receive an invoice from [facility]. Please contact [facility] at [phone] with questions or concerns regarding your invoice.   IF you received labwork today, you will receive an invoice from [facility]. Please contact [facility] at [phone] with questions or concerns regarding your invoice.   Our billing staff will not be able to assist you with questions regarding bills from these companies.  You will be contacted with the lab results as soon as they are available. The fastest way to get your results is to activate your My Chart account. Instructions are located on the last page of this paperwork. If you have not heard from us regarding the results in 2 weeks, please contact this office.

## 2016-11-18 NOTE — Progress Notes (Signed)
Patient ID: Ricardo Mills, male    DOB: 1956/01/23  Age: 61 y.o. MRN: [id number]  Chief Complaint  Patient presents with  . Arm Pain    Left arm/shoulder numbness - intermittant x 4 weeks     Subjective:   61 year old man who has been intermittently having pain and a numbness sensation down his left arm. Goes away she from the neck to the thumb. He feels it more with certain positions of his neck. He has no history of neck injuries. Did not play a lot of football when he was young. He has not had any major motor vehicle accidents with whiplash injuries. 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 He is diabetic, on diabetic medications.   He has been working out at [employer] a good deal. He incidentally complained of hurting in his left chest, points to the pectoralis area. These are just "little pains" and his wife told him to go to the doctor and get checked. No precordial pressure hard direct exertional pain.  Current allergies, medications, problem list, past/family and social histories reviewed.  Objective:  BP (!) 157/80   Pulse 60   Temp 98.5 F (36.9 C) (Oral)   Resp 16   Ht 5' 6.5" (1.689 m)   Wt 180 lb 9.6 oz (81.9 kg)   SpO2 100%   BMI 28.71 kg/m   No acute distress. Neck fairly supple though with extension of the neck he gets some pain, complains of the pain down in this left thumb being intermittent but doesn't seem to be hurting him a lot right now. Grip good. Neuromuscular intact. Chest wall nontender. Chest clear. Heart regular without murmur.  Assessment & Plan:   Assessment: 1. Left cervical radiculopathy   2. Type 2 diabetes mellitus without complication, without long-term current use of insulin (HCC)   3. Hypertension, unspecified type   4. Left sided chest pain   5. Cervical disc disease       Plan: Get EKG for the atypical chest pain which is probably musculoskeletal from his chest wall.  Check C-spine. Suspect cervical arthritic related radicular  pains.  Orders Placed This Encounter  Procedures  . DG Cervical Spine Complete    Standing Status:   Future    Number of Occurrences:   1    Standing Expiration Date:   11/18/2017    Order Specific Question:   Reason for Exam (SYMPTOM  OR DIAGNOSIS REQUIRED)    Answer:   left cervical radiculopathy c6    Order Specific Question:   Preferred imaging location?    Answer:   External  . EKG 12-Lead    No orders of the defined types were placed in this encounter.  FINDINGS: There is reversal of the normal cervical lordotic curve. There is severe disc space narrowing at C4-5 with moderate disc space narrowing at C5-6 and C6-7. LEFT-sided foraminal narrowing is most pronounced at C4-5. RIGHT-sided foraminal narrowing is most pronounced at C4-5.  IMPRESSION: Multilevel spondylosis as described.   Electronically Signed   By: Elsie StainJohn T Curnes M.D.   On: 11/18/2016 11:21  EKG has some mild nonspecific changes.  This looks very similar to an old EKG in the system from 2 years ago.    Patient Instructions   The neck has significant arthritis, which has caused narrowing of the disc spaces and of the openings where the nerves exit the spine. This pinches on the nerve to your arm causing the pains down your  arm. There is not anything easy that can be offered for this. If you're having pain you can take 400 600 mg of ibuprofen a couple of times a day. If the pain gets significantly worse, we can send you to a neck and spine specialist, but that should not be done in last you're having constant problems.  The chest pain is from your chest wall. If it should change in nature and become a heavy pressure-like pain that occurs when you exert, the you should get rechecked with other testing done. In the event of severe chest pain at anytime go straight to the emergency room or call 911.  Return as needed    IF you received an x-ray today, you will receive an invoice from [facility].  Please contact [facility] at [phone] with questions or concerns regarding your invoice.   IF you received labwork today, you will receive an invoice from [facility]. Please contact [facility] at [phone] with questions or concerns regarding your invoice.   Our billing staff will not be able to assist you with questions regarding bills from these companies.  You will be contacted with the lab results as soon as they are available. The fastest way to get your results is to activate your My Chart account. Instructions are located on the last page of this paperwork. If you have not heard from [place] regarding the results in 2 weeks, please contact this office.        Return if symptoms worsen or fail to improve.   Nahjae Hoeg, MD 11/18/2016

## 2016-12-02 MED FILL — SILDENAFIL 20 MG TABLET: 20 | 15 days supply | Qty: 30 | Fill #1

## 2016-12-15 MED FILL — LOSARTAN POTASSIUM 100 MG T: 100 | 90 days supply | Qty: 90 | Fill #1

## 2016-12-25 MED FILL — SIMVASTATIN 20 MG TABLET: 20 | 90 days supply | Qty: 90 | Fill #1

## 2016-12-25 MED FILL — glipiZIDE 5 MG TABS: 5 | 90 days supply | Qty: 90 | Fill #1

## 2016-12-28 MED FILL — metFORMIN HCL 1000 MG TABS: 1000 | 90 days supply | Qty: 180 | Fill #1

## 2017-01-22 MED FILL — SILDENAFIL 20 MG TABLET: 20 | 30 days supply | Qty: 30 | Fill #4

## 2017-03-01 ENCOUNTER — Other Ambulatory Visit: Payer: Self-pay | Admitting: Endocrinology

## 2017-03-02 MED FILL — SILDENAFIL 20 MG TABLET: 20 | 30 days supply | Qty: 30 | Fill #0

## 2017-03-19 MED FILL — LOSARTAN POTASSIUM 100 MG T: 100 | 90 days supply | Qty: 90 | Fill #2

## 2017-04-08 MED FILL — SIMVASTATIN 20 MG TABLET: 20 | 90 days supply | Qty: 90 | Fill #2

## 2017-04-08 MED FILL — metFORMIN HCL 1000 MG TABS: 1000 | 90 days supply | Qty: 180 | Fill #2

## 2017-04-21 ENCOUNTER — Other Ambulatory Visit: Payer: Self-pay | Admitting: Urgent Care

## 2017-04-21 DIAGNOSIS — E119 Type 2 diabetes mellitus without complications: Secondary | ICD-10-CM

## 2017-04-21 MED FILL — SILDENAFIL 20 MG TABLET: 20 | 30 days supply | Qty: 30 | Fill #1

## 2017-04-23 ENCOUNTER — Ambulatory Visit: Payer: BLUE CROSS/BLUE SHIELD | Admitting: Urgent Care

## 2017-04-23 ENCOUNTER — Encounter: Payer: Self-pay | Admitting: Urgent Care

## 2017-04-23 VITALS — BP 122/80 | HR 66 | Temp 98.0°F | Resp 18 | Ht 67.5 in | Wt 176.0 lb

## 2017-04-23 DIAGNOSIS — N529 Male erectile dysfunction, unspecified: Secondary | ICD-10-CM | POA: Diagnosis not present

## 2017-04-23 DIAGNOSIS — E119 Type 2 diabetes mellitus without complications: Secondary | ICD-10-CM

## 2017-04-23 DIAGNOSIS — E785 Hyperlipidemia, unspecified: Secondary | ICD-10-CM

## 2017-04-23 DIAGNOSIS — I10 Essential (primary) hypertension: Secondary | ICD-10-CM

## 2017-04-23 LAB — COMPREHENSIVE METABOLIC PANEL
A/G RATIO: 1.8 (ref 1.2–2.2)
ALT: 22 IU/L (ref 0–44)
AST: 15 IU/L (ref 0–40)
Albumin: 4.6 g/dL (ref 3.5–5.5)
Alkaline Phosphatase: 85 IU/L (ref 39–117)
BUN/Creatinine Ratio: 15 (ref 9–20)
BUN: 14 mg/dL (ref 6–24)
Bilirubin Total: 0.3 mg/dL (ref 0.0–1.2)
CALCIUM: 9.8 mg/dL (ref 8.7–10.2)
CO2: 21 mmol/L (ref 20–29)
CREATININE: 0.92 mg/dL (ref 0.76–1.27)
Chloride: 101 mmol/L (ref 96–106)
GFR, EST AFRICAN AMERICAN: 105 mL/min/{1.73_m2} (ref >59)
GFR, EST NON AFRICAN AMERICAN: 91 mL/min/{1.73_m2} (ref >59)
GLUCOSE: 278 mg/dL — AB (ref 65–99)
Globulin, Total: 2.6 g/dL (ref 1.5–4.5)
POTASSIUM: 4.4 mmol/L (ref 3.5–5.2)
Sodium: 137 mmol/L (ref 134–144)
TOTAL PROTEIN: 7.2 g/dL (ref 6.0–8.5)

## 2017-04-23 LAB — LIPID PANEL
CHOL/HDL RATIO: 2.6 ratio (ref 0.0–5.0)
Cholesterol, Total: 128 mg/dL (ref 100–199)
HDL: 50 mg/dL (ref >39)
LDL Calculated: 65 mg/dL (ref 0–99)
Triglycerides: 67 mg/dL (ref 0–149)
VLDL CHOLESTEROL CAL: 13 mg/dL (ref 5–40)

## 2017-04-23 LAB — HEMOGLOBIN A1C
ESTIMATED AVERAGE GLUCOSE: 295 mg/dL
HEMOGLOBIN A1C: 11.9 % — AB (ref 4.8–5.6)

## 2017-04-23 MED ORDER — SILDENAFIL CITRATE 20 MG PO TABS: 50.0000 mg | ORAL_TABLET | Freq: Every day | ORAL | 5 refills | Status: DC | PRN

## 2017-04-23 MED ORDER — SIMVASTATIN 20 MG PO TABS: 20.0000 mg | ORAL_TABLET | Freq: Every evening | ORAL | 3 refills | Status: DC

## 2017-04-23 MED ORDER — LOSARTAN POTASSIUM 100 MG PO TABS: 100.0000 mg | ORAL_TABLET | Freq: Every day | ORAL | 3 refills | Status: DC

## 2017-04-23 MED FILL — glipiZIDE 5 MG TABS: 5 | 90 days supply | Qty: 90 | Fill #0

## 2017-04-23 NOTE — Patient Instructions (Addendum)
Diabetes Mellitus and Food It is important for you to manage your blood sugar (glucose) level. Your blood glucose level can be greatly affected by what you eat. Eating healthier foods in the appropriate amounts throughout the day at about the same time each day will help you control your blood glucose level. It can also help slow or prevent worsening of your diabetes mellitus. Healthy eating may even help you improve the level of your blood pressure and reach or maintain a healthy weight. General recommendations for healthful eating and cooking habits include:  Eating meals and snacks regularly. Avoid going long periods of time without eating to lose weight.  Eating a diet that consists mainly of plant-based foods, such as fruits, vegetables, nuts, legumes, and whole grains.  Using low-heat cooking methods, such as baking, instead of high-heat cooking methods, such as deep frying.  Work with your dietitian to make sure you understand how to use the Nutrition Facts information on food labels. How can food affect me? Carbohydrates Carbohydrates affect your blood glucose level more than any other type of food. Your dietitian will help you determine how many carbohydrates to eat at each meal and teach you how to count carbohydrates. Counting carbohydrates is important to keep your blood glucose at a healthy level, especially if you are using insulin or taking certain medicines for diabetes mellitus. Alcohol Alcohol can cause sudden decreases in blood glucose (hypoglycemia), especially if you use insulin or take certain medicines for diabetes mellitus. Hypoglycemia can be a life-threatening condition. Symptoms of hypoglycemia (sleepiness, dizziness, and disorientation) are similar to symptoms of having too much alcohol. If your health care provider has given you approval to drink alcohol, do so in moderation and use the following guidelines:  Women should not have more than one drink per day, and men  should not have more than two drinks per day. One drink is equal to: ? 12 oz of beer. ? 5 oz of wine. ? 1 oz of hard liquor.  Do not drink on an empty stomach.  Keep yourself hydrated. Have water, diet soda, or unsweetened iced tea.  Regular soda, juice, and other mixers might contain a lot of carbohydrates and should be counted.  What foods are not recommended? As you make food choices, it is important to remember that all foods are not the same. Some foods have fewer nutrients per serving than other foods, even though they might have the same number of calories or carbohydrates. It is difficult to get your body what it needs when you eat foods with fewer nutrients. Examples of foods that you should avoid that are high in calories and carbohydrates but low in nutrients include:  Trans fats (most processed foods list trans fats on the Nutrition Facts label).  Regular soda.  Juice.  Candy.  Sweets, such as cake, pie, doughnuts, and cookies.  Fried foods.  What foods can I eat? Eat nutrient-rich foods, which will nourish your body and keep you healthy. The food you should eat also will depend on several factors, including:  The calories you need.  The medicines you take.  Your weight.  Your blood glucose level.  Your blood pressure level.  Your cholesterol level.  You should eat a variety of foods, including:  Protein. ? Lean cuts of meat. ? Proteins low in saturated fats, such as fish, egg whites, and beans. Avoid processed meats.  Fruits and vegetables. ? Fruits and vegetables that may help control blood glucose levels, such as apples,   mangoes, and yams.  Dairy products. ? Choose fat-free or low-fat dairy products, such as milk, yogurt, and cheese.  Grains, bread, pasta, and rice. ? Choose whole grain products, such as multigrain bread, whole oats, and brown rice. These foods may help control blood pressure.  Fats. ? Foods containing healthful fats, such as  nuts, avocado, olive oil, canola oil, and fish.  Does everyone with diabetes mellitus have the same meal plan? Because every person with diabetes mellitus is different, there is not one meal plan that works for everyone. It is very important that you meet with a dietitian who will help you create a meal plan that is just right for you. This information is not intended to replace advice given to you by your health care provider. Make sure you discuss any questions you have with your health care provider. Document Released: 01/22/2005 Document Revised: 10/03/2015 Document Reviewed: 03/24/2013 Elsevier Interactive Patient Education  2017 [employer]vier Inc.    IF you received an x-ray today, you will receive an invoice from [facility]. Please contact [facility] at [phone] with questions or concerns regarding your invoice.   IF you received labwork today, you will receive an invoice from [facility]. Please contact [facility] at [phone] with questions or concerns regarding your invoice.   Our billing staff will not be able to assist you with questions regarding bills from these companies.  You will be contacted with the lab results as soon as they are available. The fastest way to get your results is to activate your My Chart account. Instructions are located on the last page of this paperwork. If you have not heard from us regarding the results in 2 weeks, please contact this office.      

## 2017-04-23 NOTE — Progress Notes (Signed)
MRN: [id number] DOB: [birth year]  Subjective:   Ricardo Mills is a 61 y.o. male presenting for follow up on Hypertension, DM, HL, ED.   DM - Currently managed with glipizide, metformin. Patient denies blurred vision, polydipsia, chest pain, nausea, vomiting, abdominal pain, hematuria, polyuria, skin infections, numbness or tingling. Denies smoking cigarettes.  HTN - Managed with losartan 100mg  daily. Does not actively avoid salt in diet, is exercising. Denies dizziness, chronic headache, shortness of breath, heart racing, palpitations, lower leg swelling. Denies smoking cigarettes or drinking alcohol.   HL - Managed with simvastatin 20mg .   ED - Managed with sildenafil 20-40mg . Has an occasional headache with using sildenafil. Was told by pharmacy to obtain 50mg  dose of sildenafil for better copay.  Immunizations - Patient refused all. States he does not want immunizations.  Ricardo Mills has a current medication list which includes the following prescription(s): glipizide, glucose blood, losartan, metformin, sildenafil, sildenafil, and simvastatin. Also has No Known Allergies.  Ricardo Mills  has a past medical history of Diabetes mellitus without complication (HCC), Hyperlipidemia, and Hypertension. Also  has a past surgical history that includes Circumcision.  Objective:   Vitals: BP 122/80   Pulse 66   Temp 98 F (36.7 C) (Oral)   Resp 18   Ht 5' 7.5" (1.715 m)   Wt 176 lb (79.8 kg)   SpO2 98%   BMI 27.16 kg/m   BP Readings from Last 3 Encounters:  04/23/17 122/80  11/18/16 (!) 157/80  09/18/16 132/81   Wt Readings from Last 3 Encounters:  04/23/17 176 lb (79.8 kg)  11/18/16 180 lb 9.6 oz (81.9 kg)  09/18/16 178 lb (80.7 kg)    The 10-year ASCVD risk score Denman George(Goff DC Jr., et al., 2013) is: 19.4%   Values used to calculate the score:     Age: [age] years     Sex: Male     Is Non-Hispanic African American: Yes     Diabetic: Yes     Tobacco smoker: No     Systolic Blood Pressure: 122  mmHg     Is BP treated: Yes     HDL Cholesterol: 55 mg/dL     Total Cholesterol: 156 mg/dL  Physical Exam  Constitutional: He is oriented to person, place, and time. He appears well-developed and well-nourished.  HENT:  Mouth/Throat: Oropharynx is clear and moist.  Eyes: No scleral icterus.  Cardiovascular: Normal rate, regular rhythm and intact distal pulses. Exam reveals no gallop and no friction rub.  No murmur heard. Pulmonary/Chest: No respiratory distress. He has no wheezes. He has no rales.  Abdominal: Soft. Bowel sounds are normal. He exhibits no distension and no mass. There is no tenderness. There is no guarding.  Musculoskeletal: He exhibits no edema.  Neurological: He is alert and oriented to person, place, and time.  Skin: Skin is warm and dry.  Psychiatric: He has a normal mood and affect.   Diabetic Foot Exam - Simple   Simple Foot Form Diabetic Foot exam was performed with the following findings:  Yes 04/23/2017 11:01 AM  Visual Inspection No deformities, no ulcerations, no other skin breakdown bilaterally:  Yes Sensation Testing Intact to touch and monofilament testing bilaterally:  Yes Pulse Check Posterior Tibialis and Dorsalis pulse intact bilaterally:  Yes Comments     Assessment and Plan :   1. Controlled type 2 diabetes mellitus without complication, without long-term current use of insulin (HCC) - Will refill pending results. Stable, follow up in 6 months. -  HM DIABETES FOOT EXAM - Comprehensive metabolic panel - Hemoglobin A1c  2. Essential hypertension - Well controlled, refills provided. Follow up as above. - losartan (COZAAR) 100 MG tablet; Take 1 tablet (100 mg total) by mouth daily.  Dispense: 90 tablet; Refill: 3  3. Hyperlipidemia LDL goal <70 - Refilled, labs pending. - simvastatin (ZOCOR) 20 MG tablet; Take 1 tablet (20 mg total) by mouth every evening.  Dispense: 90 tablet; Refill: 3   4. Erectile dysfunction, unspecified erectile  dysfunction type - New dose prescribed. Monitor AE's. - sildenafil (REVATIO) 20 MG tablet; Take 2.5 tablets (50 mg total) by mouth daily as needed.  Dispense: 30 tablet; Refill: 5  Wallis BambergMario Jahkeem Kurka, [place]PA-C [facility] [phone] 04/23/2017  10:56 AM

## 2017-04-28 ENCOUNTER — Ambulatory Visit: Payer: BLUE CROSS/BLUE SHIELD | Admitting: Urgent Care

## 2017-05-18 ENCOUNTER — Ambulatory Visit: Payer: BLUE CROSS/BLUE SHIELD | Admitting: Urgent Care

## 2017-05-18 ENCOUNTER — Encounter: Payer: Self-pay | Admitting: Urgent Care

## 2017-05-18 VITALS — BP 134/80 | HR 71 | Temp 98.4°F | Resp 16 | Ht 67.5 in | Wt 180.6 lb

## 2017-05-18 DIAGNOSIS — E119 Type 2 diabetes mellitus without complications: Secondary | ICD-10-CM | POA: Diagnosis not present

## 2017-05-18 DIAGNOSIS — N529 Male erectile dysfunction, unspecified: Secondary | ICD-10-CM | POA: Diagnosis not present

## 2017-05-18 MED ORDER — SITAGLIPTIN PHOSPHATE 100 MG PO TABS: 100.0000 mg | ORAL_TABLET | Freq: Every day | ORAL | 1 refills | Status: DC

## 2017-05-18 MED ORDER — SILDENAFIL CITRATE 100 MG PO TABS
100.0000 mg | ORAL_TABLET | Freq: Every day | ORAL | 5 refills | Status: DC | PRN
Start: 2017-05-18 — End: 2017-09-21

## 2017-05-18 MED FILL — SILDENAFIL CITRATE 100 MG T: 100 | 30 days supply | Qty: 4 | Fill #0

## 2017-05-18 MED FILL — JANUVIA 100 MG TABLET: 100 | 90 days supply | Qty: 90 | Fill #0

## 2017-05-18 NOTE — Patient Instructions (Addendum)
Diabetes Mellitus and Nutrition When you have diabetes (diabetes mellitus), it is very important to have healthy eating habits because your blood sugar (glucose) levels are greatly affected by what you eat and drink. Eating healthy foods in the appropriate amounts, at about the same times every day, can help you:  Control your blood glucose.  Lower your risk of heart disease.  Improve your blood pressure.  Reach or maintain a healthy weight.  Every person with diabetes is different, and each person has different needs for a meal plan. Your health care provider may recommend that you work with a diet and nutrition specialist (dietitian) to make a meal plan that is best for you. Your meal plan may vary depending on factors such as:  The calories you need.  The medicines you take.  Your weight.  Your blood glucose, blood pressure, and cholesterol levels.  Your activity level.  Other health conditions you have, such as heart or kidney disease.  How do carbohydrates affect me? Carbohydrates affect your blood glucose level more than any other type of food. Eating carbohydrates naturally increases the amount of glucose in your blood. Carbohydrate counting is a method for keeping track of how many carbohydrates you eat. Counting carbohydrates is important to keep your blood glucose at a healthy level, especially if you use insulin or take certain oral diabetes medicines. It is important to know how many carbohydrates you can safely have in each meal. This is different for every person. Your dietitian can help you calculate how many carbohydrates you should have at each meal and for snack. Foods that contain carbohydrates include:  Bread, cereal, rice, pasta, and crackers.  Potatoes and corn.  Peas, beans, and lentils.  Milk and yogurt.  Fruit and juice.  Desserts, such as cakes, cookies, ice cream, and candy.  How does alcohol affect me? Alcohol can cause a sudden decrease in blood  glucose (hypoglycemia), especially if you use insulin or take certain oral diabetes medicines. Hypoglycemia can be a life-threatening condition. Symptoms of hypoglycemia (sleepiness, dizziness, and confusion) are similar to symptoms of having too much alcohol. If your health care provider says that alcohol is safe for you, follow these guidelines:  Limit alcohol intake to no more than 1 drink per day for nonpregnant women and 2 drinks per day for men. One drink equals 12 oz of beer, 5 oz of wine, or 1 oz of hard liquor.  Do not drink on an empty stomach.  Keep yourself hydrated with water, diet soda, or unsweetened iced tea.  Keep in mind that regular soda, juice, and other mixers may contain a lot of sugar and must be counted as carbohydrates.  What are tips for following this plan? Reading food labels  Start by checking the serving size on the label. The amount of calories, carbohydrates, fats, and other nutrients listed on the label are based on one serving of the food. Many foods contain more than one serving per package.  Check the total grams (g) of carbohydrates in one serving. You can calculate the number of servings of carbohydrates in one serving by dividing the total carbohydrates by 15. For example, if a food has 30 g of total carbohydrates, it would be equal to 2 servings of carbohydrates.  Check the number of grams (g) of saturated and trans fats in one serving. Choose foods that have low or no amount of these fats.  Check the number of milligrams (mg) of sodium in one serving. Most people   should limit total sodium intake to less than 2,300 mg per day.  Always check the nutrition information of foods labeled as "low-fat" or "nonfat". These foods may be higher in added sugar or refined carbohydrates and should be avoided.  Talk to your dietitian to identify your daily goals for nutrients listed on the label. Shopping  Avoid buying canned, premade, or processed foods. These  foods tend to be high in fat, sodium, and added sugar.  Shop around the outside edge of the grocery store. This includes fresh fruits and vegetables, bulk grains, fresh meats, and fresh dairy. Cooking  Use low-heat cooking methods, such as baking, instead of high-heat cooking methods like deep frying.  Cook using healthy oils, such as olive, canola, or sunflower oil.  Avoid cooking with butter, cream, or high-fat meats. Meal planning  Eat meals and snacks regularly, preferably at the same times every day. Avoid going long periods of time without eating.  Eat foods high in fiber, such as fresh fruits, vegetables, beans, and whole grains. Talk to your dietitian about how many servings of carbohydrates you can eat at each meal.  Eat 4-6 ounces of lean protein each day, such as lean meat, chicken, fish, eggs, or tofu. 1 ounce is equal to 1 ounce of meat, chicken, or fish, 1 egg, or 1/4 cup of tofu.  Eat some foods each day that contain healthy fats, such as avocado, nuts, seeds, and fish. Lifestyle   Check your blood glucose regularly.  Exercise at least 30 minutes 5 or more days each week, or as told by your health care provider.  Take medicines as told by your health care provider.  Do not use any products that contain nicotine or tobacco, such as cigarettes and e-cigarettes. If you need help quitting, ask your health care provider.  Work with a Veterinary surgeon or diabetes educator to identify strategies to manage stress and any emotional and social challenges. What are some questions to ask my health care provider?  Do I need to meet with a diabetes educator?  Do I need to meet with a dietitian?  What number can I call if I have questions?  When are the best times to check my blood glucose? Where to find more information:  American Diabetes Association: diabetes.org/food-and-fitness/food  Academy of Nutrition and Dietetics:  https://www.vargas.com/  [employer] of Diabetes and Digestive and Kidney Diseases (NIH): FindJewelers.cz Summary  A healthy meal plan will help you control your blood glucose and maintain a healthy lifestyle.  Working with a diet and nutrition specialist (dietitian) can help you make a meal plan that is best for you.  Keep in mind that carbohydrates and alcohol have immediate effects on your blood glucose levels. It is important to count carbohydrates and to use alcohol carefully. This information is not intended to replace advice given to you by your health care provider. Make sure you discuss any questions you have with your health care provider. Document Released: 01/22/2005 Document Revised: 06/01/2016 Document Reviewed: 06/01/2016 Elsevier Interactive Patient Education  2018 [employer].    Sildenafil tablets (Viagra) What is this medicine? SILDENAFIL (sil DEN a fil) is used to treat erection problems in men. This medicine may be used for other purposes; ask your health care provider or pharmacist if you have questions. COMMON BRAND NAME(S): Viagra What should I tell my health care provider before I take this medicine? They need to know if you have any of these conditions: -bleeding disorders -eye or vision problems, including  a rare inherited eye disease called retinitis pigmentosa -anatomical deformation of the penis, Peyronie's disease, or history of priapism (painful and prolonged erection) -heart disease, angina, a history of heart attack, irregular heart beats, or other heart problems -high or low blood pressure -history of blood diseases, like sickle cell anemia or leukemia -history of stomach bleeding -kidney disease -liver disease -stroke -an unusual or allergic reaction to sildenafil, other medicines, foods, dyes, or preservatives -pregnant or  trying to get pregnant -breast-feeding How should I use this medicine? Take this medicine by mouth with a glass of water. Follow the directions on the prescription label. The dose is usually taken 1 hour before sexual activity. You should not take the dose more than once per day. Do not take your medicine more often than directed. Talk to your pediatrician regarding the use of this medicine in children. This medicine is not used in children for this condition. Overdosage: If you think you have taken too much of this medicine contact a poison control center or emergency room at once. NOTE: This medicine is only for you. Do not share this medicine with others. What if I miss a dose? This does not apply. Do not take double or extra doses. What may interact with this medicine? Do not take this medicine with any of the following medications: -cisapride -nitrates like amyl nitrite, isosorbide dinitrate, isosorbide mononitrate, nitroglycerin -riociguat This medicine may also interact with the following medications: -antiviral medicines for HIV or AIDS -bosentan -certain medicines for benign prostatic hyperplasia (BPH) -certain medicines for blood pressure -certain medicines for fungal infections like ketoconazole and itraconazole -cimetidine -erythromycin -rifampin This list may not describe all possible interactions. Give your health care provider a list of all the medicines, herbs, non-prescription drugs, or dietary supplements you use. Also tell them if you smoke, drink alcohol, or use illegal drugs. Some items may interact with your medicine. What should I watch for while using this medicine? If you notice any changes in your vision while taking this drug, call your doctor or health care professional as soon as possible. Stop using this medicine and call your health care provider right away if you have a loss of sight in one or both eyes. Contact your doctor or health care professional right  away if you have an erection that lasts longer than 4 hours or if it becomes painful. This may be a sign of a serious problem and must be treated right away to prevent permanent damage. If you experience symptoms of nausea, dizziness, chest pain or arm pain upon initiation of sexual activity after taking this medicine, you should refrain from further activity and call your doctor or health care professional as soon as possible. Do not drink alcohol to excess (examples, 5 glasses of wine or 5 shots of whiskey) when taking this medicine. When taken in excess, alcohol can increase your chances of getting a headache or getting dizzy, increasing your heart rate or lowering your blood pressure. Using this medicine does not protect you or your partner against HIV infection (the virus that causes AIDS) or other sexually transmitted diseases. What side effects may I notice from receiving this medicine? Side effects that you should report to your doctor or health care professional as soon as possible: -allergic reactions like skin rash, itching or hives, swelling of the face, lips, or tongue -breathing problems -changes in hearing -changes in vision -chest pain -fast, irregular heartbeat -prolonged or painful erection -seizures Side effects that usually do not require  medical attention (report to your doctor or health care professional if they continue or are bothersome): -back pain -dizziness -flushing -headache -indigestion -muscle aches -nausea -stuffy or runny nose This list may not describe all possible side effects. Call your doctor for medical advice about side effects. You may report side effects to FDA at 1-800-FDA-1088. Where should I keep my medicine? Keep out of reach of children. Store at room temperature between 15 and 30 degrees C (59 and 86 degrees F). Throw away any unused medicine after the expiration date. NOTE: This sheet is a summary. It may not cover all possible information.  If you have questions about this medicine, talk to your doctor, pharmacist, or health care provider.  2018 Elsevier/Gold Standard (2015-04-10 12:00:25)     IF you received an x-ray today, you will receive an invoice from [facility]. Please contact [facility] at [phone] with questions or concerns regarding your invoice.   IF you received labwork today, you will receive an invoice from [facility]. Please contact [facility] at [phone] with questions or concerns regarding your invoice.   Our billing staff will not be able to assist you with questions regarding bills from these companies.  You will be contacted with the lab results as soon as they are available. The fastest way to get your results is to activate your My Chart account. Instructions are located on the last page of this paperwork. If you have not heard from us regarding the results in 2 weeks, please contact this office.

## 2017-05-18 NOTE — Progress Notes (Signed)
   MRN: [id number]  Subjective:   Ricardo Mills is a 62 y.o. male who presents for follow up of Type 2 Diabetes Mellitus.   Patient is currently managed with glipizide, metformin. Patient is not checking home blood sugars. Admits polyuria. Denies blurred vision, polydipsia, chest pain, nausea, vomiting, abdominal pain, hematuria, skin infections, numbness or tingling. Diet is not compliant and admits that he has been eating a lot of bread, biscuits, potatoes. He has cut back on these foods and has started to exercise. Denies smoking cigarettes or drinking alcohol. He would also like to try 100mg  tablets of Viagra. He currently has 50mg  tablets and has tried 2 at a time but still has difficulty with erections.  Ricardo Mills has a current medication list which includes the following prescription(s): glipizide, glucose blood, losartan, metformin, sildenafil, and simvastatin. Also has No Known Allergies.  Ricardo Mills  has a past medical history of Diabetes mellitus without complication (HCC), Hyperlipidemia, and Hypertension. Also  has a past surgical history that includes Circumcision.  Objective:   PHYSICAL EXAM BP 134/80   Pulse 71   Temp 98.4 F (36.9 C) (Oral)   Resp 16   Ht 5' 7.5" (1.715 m)   Wt 180 lb 9.6 oz (81.9 kg)   SpO2 98%   BMI 27.87 kg/m   Physical Exam  Constitutional: He is oriented to person, place, and time. He appears well-developed and well-nourished.  Cardiovascular: Normal rate, regular rhythm and intact distal pulses. Exam reveals no gallop and no friction rub.  No murmur heard. Pulmonary/Chest: No respiratory distress. He has no wheezes. He has no rales.  Neurological: He is alert and oriented to person, place, and time.   Assessment and Plan :   1. Type 2 diabetes mellitus without complication, without long-term current use of insulin (HCC) - Emphasized need for lifestyle modifications. Maintain glipizide, metformin, add sitagliptin. Recheck diabetes in 3 months.   2.  Erectile dysfunction, unspecified erectile dysfunction type - Provided patient with script for 100mg  sildenafil. This is likely due to his poorly controlled diabetes. He refused other ED medications. Counseled patient on potential for adverse effects with medications prescribed today, patient verbalized understanding.   Wallis BambergMario Jamee Pacholski, PA-C [facility] [phone] 05/18/2017 10:47 AM

## 2017-06-17 MED FILL — SILDENAFIL CITRATE 100 MG T: 100 | 30 days supply | Qty: 4 | Fill #1

## 2017-06-25 MED FILL — LOSARTAN POTASSIUM 100 MG T: 100 | 90 days supply | Qty: 90 | Fill #3

## 2017-07-14 MED FILL — metFORMIN HCL 1000 MG TABS: 1000 | 90 days supply | Qty: 180 | Fill #3

## 2017-07-14 MED FILL — SIMVASTATIN 20 MG TABLET: 20 | 90 days supply | Qty: 90 | Fill #3

## 2017-07-30 MED FILL — glipiZIDE 5 MG TABS: 5 | 90 days supply | Qty: 90 | Fill #1

## 2017-07-30 MED FILL — SILDENAFIL CITRATE 100 MG T: 100 | 30 days supply | Qty: 4 | Fill #2

## 2017-08-20 MED FILL — JANUVIA 100 MG TABLET: 100 | 90 days supply | Qty: 90 | Fill #1

## 2017-09-17 MED FILL — SILDENAFIL CITRATE 100 MG T: 100 | 30 days supply | Qty: 4 | Fill #3

## 2017-09-21 ENCOUNTER — Ambulatory Visit (INDEPENDENT_AMBULATORY_CARE_PROVIDER_SITE_OTHER): Payer: BLUE CROSS/BLUE SHIELD | Admitting: Urgent Care

## 2017-09-21 ENCOUNTER — Encounter: Payer: Self-pay | Admitting: Urgent Care

## 2017-09-21 ENCOUNTER — Other Ambulatory Visit: Payer: Self-pay

## 2017-09-21 VITALS — BP 126/78 | HR 64 | Temp 97.7°F | Ht 66.0 in | Wt 180.4 lb

## 2017-09-21 DIAGNOSIS — Z130 Encounter for screening for diseases of the blood and blood-forming organs and certain disorders involving the immune mechanism: Secondary | ICD-10-CM | POA: Diagnosis not present

## 2017-09-21 DIAGNOSIS — E785 Hyperlipidemia, unspecified: Secondary | ICD-10-CM | POA: Diagnosis not present

## 2017-09-21 DIAGNOSIS — E119 Type 2 diabetes mellitus without complications: Secondary | ICD-10-CM | POA: Diagnosis not present

## 2017-09-21 DIAGNOSIS — Z114 Encounter for screening for human immunodeficiency virus [HIV]: Secondary | ICD-10-CM

## 2017-09-21 DIAGNOSIS — Z0000 Encounter for general adult medical examination without abnormal findings: Secondary | ICD-10-CM | POA: Diagnosis not present

## 2017-09-21 DIAGNOSIS — Z1159 Encounter for screening for other viral diseases: Secondary | ICD-10-CM

## 2017-09-21 DIAGNOSIS — I10 Essential (primary) hypertension: Secondary | ICD-10-CM

## 2017-09-21 DIAGNOSIS — N529 Male erectile dysfunction, unspecified: Secondary | ICD-10-CM

## 2017-09-21 MED ORDER — LOSARTAN POTASSIUM 100 MG PO TABS: 100.0000 mg | ORAL_TABLET | Freq: Every day | ORAL | 3 refills | Status: DC

## 2017-09-21 MED ORDER — METFORMIN HCL 1000 MG PO TABS: 1000.0000 mg | ORAL_TABLET | Freq: Two times a day (BID) | ORAL | 3 refills | Status: DC

## 2017-09-21 MED ORDER — SILDENAFIL CITRATE 100 MG PO TABS: 100.0000 mg | ORAL_TABLET | Freq: Every day | ORAL | 5 refills | Status: DC | PRN

## 2017-09-21 MED ORDER — GLIPIZIDE 5 MG PO TABS: ORAL_TABLET | ORAL | 1 refills | Status: DC

## 2017-09-21 MED ORDER — SIMVASTATIN 20 MG PO TABS: 20.0000 mg | ORAL_TABLET | Freq: Every evening | ORAL | 3 refills | Status: DC

## 2017-09-21 MED ORDER — SITAGLIPTIN PHOSPHATE 100 MG PO TABS: 100.0000 mg | ORAL_TABLET | Freq: Every day | ORAL | 1 refills | Status: DC

## 2017-09-21 MED FILL — SIMVASTATIN 20 MG TABLET: 20 | 90 days supply | Qty: 90 | Fill #0

## 2017-09-21 MED FILL — metFORMIN HCL 1000 MG TABS: 1000 | 90 days supply | Qty: 180 | Fill #0

## 2017-09-21 MED FILL — LOSARTAN POTASSIUM 100 MG T: 100 | 90 days supply | Qty: 90 | Fill #0

## 2017-09-21 NOTE — Patient Instructions (Addendum)
Diabetes Mellitus and Nutrition When you have diabetes (diabetes mellitus), it is very important to have healthy eating habits because your blood sugar (glucose) levels are greatly affected by what you eat and drink. Eating healthy foods in the appropriate amounts, at about the same times every day, can help you:  Control your blood glucose.  Lower your risk of heart disease.  Improve your blood pressure.  Reach or maintain a healthy weight.  Every person with diabetes is different, and each person has different needs for a meal plan. Your health care provider may recommend that you work with a diet and nutrition specialist (dietitian) to make a meal plan that is best for you. Your meal plan may vary depending on factors such as:  The calories you need.  The medicines you take.  Your weight.  Your blood glucose, blood pressure, and cholesterol levels.  Your activity level.  Other health conditions you have, such as heart or kidney disease.  How do carbohydrates affect me? Carbohydrates affect your blood glucose level more than any other type of food. Eating carbohydrates naturally increases the amount of glucose in your blood. Carbohydrate counting is a method for keeping track of how many carbohydrates you eat. Counting carbohydrates is important to keep your blood glucose at a healthy level, especially if you use insulin or take certain oral diabetes medicines. It is important to know how many carbohydrates you can safely have in each meal. This is different for every person. Your dietitian can help you calculate how many carbohydrates you should have at each meal and for snack. Foods that contain carbohydrates include:  Bread, cereal, rice, pasta, and crackers.  Potatoes and corn.  Peas, beans, and lentils.  Milk and yogurt.  Fruit and juice.  Desserts, such as cakes, cookies, ice cream, and candy.  How does alcohol affect me? Alcohol can cause a sudden decrease in blood  glucose (hypoglycemia), especially if you use insulin or take certain oral diabetes medicines. Hypoglycemia can be a life-threatening condition. Symptoms of hypoglycemia (sleepiness, dizziness, and confusion) are similar to symptoms of having too much alcohol. If your health care provider says that alcohol is safe for you, follow these guidelines:  Limit alcohol intake to no more than 1 drink per day for nonpregnant women and 2 drinks per day for men. One drink equals 12 oz of beer, 5 oz of wine, or 1 oz of hard liquor.  Do not drink on an empty stomach.  Keep yourself hydrated with water, diet soda, or unsweetened iced tea.  Keep in mind that regular soda, juice, and other mixers may contain a lot of sugar and must be counted as carbohydrates.  What are tips for following this plan? Reading food labels  Start by checking the serving size on the label. The amount of calories, carbohydrates, fats, and other nutrients listed on the label are based on one serving of the food. Many foods contain more than one serving per package.  Check the total grams (g) of carbohydrates in one serving. You can calculate the number of servings of carbohydrates in one serving by dividing the total carbohydrates by 15. For example, if a food has 30 g of total carbohydrates, it would be equal to 2 servings of carbohydrates.  Check the number of grams (g) of saturated and trans fats in one serving. Choose foods that have low or no amount of these fats.  Check the number of milligrams (mg) of sodium in one serving. Most people   should limit total sodium intake to less than 2,300 mg per day.  Always check the nutrition information of foods labeled as "low-fat" or "nonfat". These foods may be higher in added sugar or refined carbohydrates and should be avoided.  Talk to your dietitian to identify your daily goals for nutrients listed on the label. Shopping  Avoid buying canned, premade, or processed foods. These  foods tend to be high in fat, sodium, and added sugar.  Shop around the outside edge of the grocery store. This includes fresh fruits and vegetables, bulk grains, fresh meats, and fresh dairy. Cooking  Use low-heat cooking methods, such as baking, instead of high-heat cooking methods like deep frying.  Cook using healthy oils, such as olive, canola, or sunflower oil.  Avoid cooking with butter, cream, or high-fat meats. Meal planning  Eat meals and snacks regularly, preferably at the same times every day. Avoid going long periods of time without eating.  Eat foods high in fiber, such as fresh fruits, vegetables, beans, and whole grains. Talk to your dietitian about how many servings of carbohydrates you can eat at each meal.  Eat 4-6 ounces of lean protein each day, such as lean meat, chicken, fish, eggs, or tofu. 1 ounce is equal to 1 ounce of meat, chicken, or fish, 1 egg, or 1/4 cup of tofu.  Eat some foods each day that contain healthy fats, such as avocado, nuts, seeds, and fish. Lifestyle   Check your blood glucose regularly.  Exercise at least 30 minutes 5 or more days each week, or as told by your health care provider.  Take medicines as told by your health care provider.  Do not use any products that contain nicotine or tobacco, such as cigarettes and e-cigarettes. If you need help quitting, ask your health care provider.  Work with a counselor or diabetes educator to identify strategies to manage stress and any emotional and social challenges. What are some questions to ask my health care provider?  Do I need to meet with a diabetes educator?  Do I need to meet with a dietitian?  What number can I call if I have questions?  When are the best times to check my blood glucose? Where to find more information:  American Diabetes Association: diabetes.org/food-and-fitness/food  Academy of Nutrition and Dietetics:  www.eatright.org/resources/health/diseases-and-conditions/diabetes  National Institute of Diabetes and Digestive and Kidney Diseases (NIH): www.niddk.nih.gov/health-information/diabetes/overview/diet-eating-physical-activity Summary  A healthy meal plan will help you control your blood glucose and maintain a healthy lifestyle.  Working with a diet and nutrition specialist (dietitian) can help you make a meal plan that is best for you.  Keep in mind that carbohydrates and alcohol have immediate effects on your blood glucose levels. It is important to count carbohydrates and to use alcohol carefully. This information is not intended to replace advice given to you by your health care provider. Make sure you discuss any questions you have with your health care provider. Document Released: 01/22/2005 Document Revised: 06/01/2016 Document Reviewed: 06/01/2016 Elsevier Interactive Patient Education  2018 Elsevier Inc.    Health Maintenance, Male A healthy lifestyle and preventive care is important for your health and wellness. Ask your health care provider about what schedule of regular examinations is right for you. What should I know about weight and diet? Eat a Healthy Diet  Eat plenty of vegetables, fruits, whole grains, low-fat dairy products, and lean protein.  Do not eat a lot of foods high in solid fats, added sugars, or salt.    Maintain a Healthy Weight Regular exercise can help you achieve or maintain a healthy weight. You should:  Do at least 150 minutes of exercise each week. The exercise should increase your heart rate and make you sweat (moderate-intensity exercise).  Do strength-training exercises at least twice a week.  Watch Your Levels of Cholesterol and Blood Lipids  Have your blood tested for lipids and cholesterol every 5 years starting at 60 years of age. If you are at high risk for heart disease, you should start having your blood tested when you are 60 years old.  You may need to have your cholesterol levels checked more often if: ? Your lipid or cholesterol levels are high. ? You are older than 60 years of age. ? You are at high risk for heart disease.  What should I know about cancer screening? Many types of cancers can be detected early and may often be prevented. Lung Cancer  You should be screened every year for lung cancer if: ? You are a current smoker who has smoked for at least 30 years. ? You are a former smoker who has quit within the past 15 years.  Talk to your health care provider about your screening options, when you should start screening, and how often you should be screened.  Colorectal Cancer  Routine colorectal cancer screening usually begins at 60 years of age and should be repeated every 5-10 years until you are 60 years old. You may need to be screened more often if early forms of precancerous polyps or small growths are found. Your health care provider may recommend screening at an earlier age if you have risk factors for colon cancer.  Your health care provider may recommend using home test kits to check for hidden blood in the stool.  A small camera at the end of a tube can be used to examine your colon (sigmoidoscopy or colonoscopy). This checks for the earliest forms of colorectal cancer.  Prostate and Testicular Cancer  Depending on your age and overall health, your health care provider may do certain tests to screen for prostate and testicular cancer.  Talk to your health care provider about any symptoms or concerns you have about testicular or prostate cancer.  Skin Cancer  Check your skin from head to toe regularly.  Tell your health care provider about any new moles or changes in moles, especially if: ? There is a change in a mole's size, shape, or color. ? You have a mole that is larger than a pencil eraser.  Always use sunscreen. Apply sunscreen liberally and repeat throughout the day.  Protect  yourself by wearing long sleeves, pants, a wide-brimmed hat, and sunglasses when outside.  What should I know about heart disease, diabetes, and high blood pressure?  If you are 18-39 years of age, have your blood pressure checked every 3-5 years. If you are 40 years of age or older, have your blood pressure checked every year. You should have your blood pressure measured twice-once when you are at a hospital or clinic, and once when you are not at a hospital or clinic. Record the average of the two measurements. To check your blood pressure when you are not at a hospital or clinic, you can use: ? An automated blood pressure machine at a pharmacy. ? A home blood pressure monitor.  Talk to your health care provider about your target blood pressure.  If you are between 45-79 years old, ask your health care provider if   you should take aspirin to prevent heart disease.  Have regular diabetes screenings by checking your fasting blood sugar level. ? If you are at a normal weight and have a low risk for diabetes, have this test once every three years after the age of 45. ? If you are overweight and have a high risk for diabetes, consider being tested at a younger age or more often.  A one-time screening for abdominal aortic aneurysm (AAA) by ultrasound is recommended for men aged 65-75 years who are current or former smokers. What should I know about preventing infection? Hepatitis B If you have a higher risk for hepatitis B, you should be screened for this virus. Talk with your health care provider to find out if you are at risk for hepatitis B infection. Hepatitis C Blood testing is recommended for:  Everyone born from 1945 through 1965.  Anyone with known risk factors for hepatitis C.  Sexually Transmitted Diseases (STDs)  You should be screened each year for STDs including gonorrhea and chlamydia if: ? You are sexually active and are younger than 60 years of age. ? You are older than 60  years of age and your health care provider tells you that you are at risk for this type of infection. ? Your sexual activity has changed since you were last screened and you are at an increased risk for chlamydia or gonorrhea. Ask your health care provider if you are at risk.  Talk with your health care provider about whether you are at high risk of being infected with HIV. Your health care provider may recommend a prescription medicine to help prevent HIV infection.  What else can I do?  Schedule regular health, dental, and eye exams.  Stay current with your vaccines (immunizations).  Do not use any tobacco products, such as cigarettes, chewing tobacco, and e-cigarettes. If you need help quitting, ask your health care provider.  Limit alcohol intake to no more than 2 drinks per day. One drink equals 12 ounces of beer, 5 ounces of wine, or 1 ounces of hard liquor.  Do not use street drugs.  Do not share needles.  Ask your health care provider for help if you need support or information about quitting drugs.  Tell your health care provider if you often feel depressed.  Tell your health care provider if you have ever been abused or do not feel safe at home. This information is not intended to replace advice given to you by your health care provider. Make sure you discuss any questions you have with your health care provider. Document Released: 10/24/2007 Document Revised: 12/25/2015 Document Reviewed: 01/29/2015 Elsevier Interactive Patient Education  2018 Elsevier Inc.     IF you received an x-ray today, you will receive an invoice from [facility]. Please contact [facility] at [phone] with questions or concerns regarding your invoice.   IF you received labwork today, you will receive an invoice from [facility]. Please contact [facility] at [phone] with questions or concerns regarding your invoice.   Our billing staff will not be able to assist you with  questions regarding bills from these companies.  You will be contacted with the lab results as soon as they are available. The fastest way to get your results is to activate your My Chart account. Instructions are located on the last page of this paperwork. If you have not heard from us regarding the results in 2 weeks, please contact this office.     

## 2017-09-21 NOTE — Progress Notes (Signed)
MRN: [id number]  Subjective:   Ricardo Mills is a 62 y.o. male presenting for annual physical exam.  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  This includes testing for tuberculosis. Denies smoking cigarettes or drinking alcohol.   Medical care team includes: PCP: Wallis Bamberg, PA-C Vision: Wears glasses, has yearly eye exam with [facility].  Dental: No dental care. Wears dentures. Specialists: None.  Health Maintenance: Age-appropriate screening performed.  He is not currently due for his colonoscopy at this time.  Hepatitis C screening performed for low risk patient.  Immunizations are up-to-date per chart review.  Ricardo Mills has a current medication list which includes the following prescription(s): glipizide, glucose blood, losartan, metformin, sildenafil, simvastatin, and sitagliptin. He has No Known Allergies.  Ricardo Mills  has a past medical history of Diabetes mellitus without complication (HCC), Hyperlipidemia, and Hypertension. Also  has a past surgical history that includes Circumcision. His  family history includes Cancer (age of onset: 67) in his sister; Diabetes in his brother, father, mother, sister, sister, sister, and sister; Hyperlipidemia in his brother and mother; Hypertension in his brother, brother, and mother.  Review of Systems  Constitutional: Negative for chills, diaphoresis, fever, malaise/fatigue and weight loss.  HENT: Negative for congestion, ear discharge, ear pain, hearing loss, nosebleeds, sore throat and tinnitus.   Eyes: Negative for blurred vision, double vision, photophobia, pain, discharge and redness.  Respiratory: Negative for cough, shortness of breath and wheezing.   Cardiovascular: Negative for chest pain, palpitations and leg swelling.  Gastrointestinal: Negative for abdominal pain, blood in stool, constipation, diarrhea, nausea and vomiting.    Genitourinary: Negative for dysuria, flank pain, frequency, hematuria and urgency.  Musculoskeletal: Negative for back pain, joint pain and myalgias.  Skin: Negative for itching and rash.  Neurological: Negative for dizziness, tingling, seizures, loss of consciousness, weakness and headaches.  Endo/Heme/Allergies: Negative for polydipsia.  Psychiatric/Behavioral: Negative for depression, hallucinations, memory loss, substance abuse and suicidal ideas. The patient is not nervous/anxious and does not have insomnia.     Objective:   Vitals: BP 126/78 (BP Location: Right Arm, Patient Position: Sitting, Cuff Size: Normal)   Pulse 64   Temp 97.7 F (36.5 C) (Oral)   Ht 5' 7.5" (1.715 m)   Wt 180 lb 6.4 oz (81.8 kg)   SpO2 98%   BMI 27.84 kg/m   Wt Readings from Last 3 Encounters:  09/21/17 180 lb 6.4 oz (81.8 kg)  05/18/17 180 lb 9.6 oz (81.9 kg)  04/23/17 176 lb (79.8 kg)   BP Readings from Last 3 Encounters:  09/21/17 126/78  05/18/17 134/80  04/23/17 122/80    Visual Acuity Screening   Right eye Left eye Both eyes  Without correction:     With correction: 20/20 -1 20/20 20/15 -1   Physical Exam  Constitutional: He is oriented to person, place, and time. He appears well-developed and well-nourished.  HENT:  TM's intact bilaterally, no effusions or erythema. Nasal turbinates pink and moist, nasal passages patent. No sinus tenderness. Oropharynx clear, mucous membranes moist, dentition in good repair.  Eyes: Pupils are equal, round, and reactive to light. Conjunctivae and EOM are normal. Right eye exhibits no discharge. Left eye exhibits no discharge. No scleral icterus.  Neck: Normal range of motion. Neck supple. No thyromegaly present.  Cardiovascular: Normal rate, regular rhythm and intact distal pulses. Exam reveals no gallop and no friction rub.  No murmur heard. Pulmonary/Chest: No stridor. No respiratory distress. He has no wheezes. He has no rales.  Abdominal: Soft.  Bowel sounds are normal. He exhibits no distension and no mass. There is no tenderness.  Musculoskeletal: Normal range of motion. He exhibits no edema or tenderness.  Lymphadenopathy:    He has no cervical adenopathy.  Neurological: He is alert and oriented to person, place, and time. He has normal reflexes. He displays normal reflexes. Coordination normal.  Skin: Skin is warm and dry. No rash noted. No erythema. No pallor.  Psychiatric: He has a normal mood and affect.   Assessment and Plan :   Annual physical exam  Essential hypertension - Plan: Comprehensive metabolic panel, TSH, losartan (COZAAR) 100 MG tablet  Hyperlipidemia LDL goal <70 - Plan: Lipid panel, simvastatin (ZOCOR) 20 MG tablet  Type 2 diabetes mellitus without complication, without long-term current use of insulin (HCC) - Plan: Hemoglobin A1c, glipiZIDE (GLUCOTROL) 5 MG tablet  Erectile dysfunction, unspecified erectile dysfunction type - Plan: sildenafil (VIAGRA) 100 MG tablet  Screening for deficiency anemia - Plan: CBC  Encounter for hepatitis C screening test for low risk patient - Plan: Hepatitis C antibody  Screening for HIV (human immunodeficiency virus) - Plan: HIV antibody  Patient did not set up his office visit as planned due to his elevated A1c.  Today we will recheck this again and counseled patient on significance of compliance with diet and follow-up.  All other medications refilled as well including those for his hypertension, hyperlipidemia and erectile dysfunction.  Patient is medically stable, labs pending. Discussed healthy lifestyle, diet, exercise, preventative care, vaccinations, and addressed patient's concerns.  His form for his foster care was completed today.  Wallis Bamberg, PA-C [facility] [phone] 09/21/2017  10:51 AM

## 2017-09-22 LAB — LIPID PANEL
CHOLESTEROL TOTAL: 120 mg/dL (ref 100–199)
Chol/HDL Ratio: 2.6 ratio (ref 0.0–5.0)
HDL: 47 mg/dL (ref >39)
LDL Calculated: 64 mg/dL (ref 0–99)
Triglycerides: 46 mg/dL (ref 0–149)
VLDL CHOLESTEROL CAL: 9 mg/dL (ref 5–40)

## 2017-09-22 LAB — COMPREHENSIVE METABOLIC PANEL
A/G RATIO: 1.9 (ref 1.2–2.2)
ALK PHOS: 71 IU/L (ref 39–117)
ALT: 22 IU/L (ref 0–44)
AST: 17 IU/L (ref 0–40)
Albumin: 4.6 g/dL (ref 3.5–5.5)
BILIRUBIN TOTAL: 0.3 mg/dL (ref 0.0–1.2)
BUN / CREAT RATIO: 13 (ref 9–20)
BUN: 12 mg/dL (ref 6–24)
CHLORIDE: 106 mmol/L (ref 96–106)
CO2: 19 mmol/L — ABNORMAL LOW (ref 20–29)
Calcium: 9.3 mg/dL (ref 8.7–10.2)
Creatinine, Ser: 0.95 mg/dL (ref 0.76–1.27)
GFR calc Af Amer: 101 mL/min/{1.73_m2} (ref >59)
GFR calc non Af Amer: 87 mL/min/{1.73_m2} (ref >59)
Globulin, Total: 2.4 g/dL (ref 1.5–4.5)
Glucose: 113 mg/dL — ABNORMAL HIGH (ref 65–99)
POTASSIUM: 4.2 mmol/L (ref 3.5–5.2)
Sodium: 141 mmol/L (ref 134–144)
Total Protein: 7 g/dL (ref 6.0–8.5)

## 2017-09-22 LAB — CBC
Hematocrit: 39.9 % (ref 37.5–51.0)
Hemoglobin: 13.9 g/dL (ref 13.0–17.7)
MCH: 26.2 pg — AB (ref 26.6–33.0)
MCHC: 34.8 g/dL (ref 31.5–35.7)
MCV: 75 fL — AB (ref 79–97)
PLATELETS: 167 10*3/uL (ref 150–379)
RBC: 5.31 x10E6/uL (ref 4.14–5.80)
RDW: 15.3 % (ref 12.3–15.4)
WBC: 4.6 10*3/uL (ref 3.4–10.8)

## 2017-09-22 LAB — TSH: TSH: 2.22 u[IU]/mL (ref 0.450–4.500)

## 2017-09-22 LAB — HEMOGLOBIN A1C
Est. average glucose Bld gHb Est-mCnc: 160 mg/dL
Hgb A1c MFr Bld: 7.2 % — ABNORMAL HIGH (ref 4.8–5.6)

## 2017-09-22 LAB — HEPATITIS C ANTIBODY

## 2017-09-22 LAB — HIV ANTIBODY (ROUTINE TESTING W REFLEX): HIV Screen 4th Generation wRfx: NONREACTIVE

## 2017-10-27 ENCOUNTER — Encounter: Payer: Self-pay | Admitting: Family Medicine

## 2017-10-27 ENCOUNTER — Ambulatory Visit: Payer: BLUE CROSS/BLUE SHIELD | Admitting: Family Medicine

## 2017-10-27 ENCOUNTER — Other Ambulatory Visit: Payer: Self-pay

## 2017-10-27 VITALS — BP 138/91 | HR 76 | Temp 98.6°F | Resp 18 | Ht 66.77 in | Wt 180.0 lb

## 2017-10-27 DIAGNOSIS — M545 Low back pain: Secondary | ICD-10-CM

## 2017-10-27 DIAGNOSIS — R1032 Left lower quadrant pain: Secondary | ICD-10-CM | POA: Diagnosis not present

## 2017-10-27 LAB — POCT URINALYSIS DIP (MANUAL ENTRY)
Bilirubin, UA: NEGATIVE
Blood, UA: NEGATIVE
Glucose, UA: NEGATIVE mg/dL
Ketones, POC UA: NEGATIVE mg/dL
Leukocytes, UA: NEGATIVE
Nitrite, UA: NEGATIVE
Protein Ur, POC: NEGATIVE mg/dL
Spec Grav, UA: 1.025 (ref 1.010–1.025)
Urobilinogen, UA: 0.2 E.U./dL
pH, UA: 5.5 (ref 5.0–8.0)

## 2017-10-27 MED ORDER — GABAPENTIN 300 MG PO CAPS: 300.0000 mg | ORAL_CAPSULE | Freq: Three times a day (TID) | ORAL | 0 refills | Status: DC

## 2017-10-27 MED ORDER — IBUPROFEN 800 MG PO TABS: 800.0000 mg | ORAL_TABLET | Freq: Three times a day (TID) | ORAL | 0 refills | Status: DC | PRN

## 2017-10-27 MED FILL — IBUPROFEN 800 MG TAB: 800 | 10 days supply | Qty: 30 | Fill #0

## 2017-10-27 MED FILL — GABAPENTIN 300 MG CAPSULE: 300 | 10 days supply | Qty: 30 | Fill #0

## 2017-10-27 NOTE — Progress Notes (Signed)
6/19/201910:41 AM  Ricardo Mills [birth year], 62 y.o. male [id number]  Chief Complaint  Patient presents with  . Back Pain    X3 days-  lower back left side  . Groin Pain    X 3 days    HPI:   Patient is a 62 y.o. male with past medical history significant for DM2 and HLP who presents today for 3 days of left lower back pain that radiates to his left groin  It started after helping a friend move this past weekend He denies any any scrotal swelling or bulge, dysuria, hematuria He denies any radiation of back pan down his leg He denies any abd pain, nausea, vomiting, diarrhea, constipation He denies any loss of bladder or bowel function He denies any fever, chills or malaise  Fall Risk  10/27/2017 09/21/2017 04/23/2017  Falls in the past year? No No No     Depression screen [facility] 2/9 10/27/2017 09/21/2017 04/23/2017  Decreased Interest 0 0 0  Down, Depressed, Hopeless 0 0 0  PHQ - 2 Score 0 0 0    No Known Allergies  Prior to Admission medications   Medication Sig Start Date End Date Taking? Authorizing Provider  glipiZIDE (GLUCOTROL) 5 MG tablet TAKE 1 TABLET BY MOUTH ONCE DAILY BEFORE BREAKFAST (MAKE SURE TO EAT WITH THIS MEDICATION) 09/21/17  Yes Wallis Bamberg, PA-C  glucose blood (ONE TOUCH ULTRA TEST) test strip 1 each by Other route daily. And lancets 1/day 250.02 03/09/13  Yes Romero Belling, MD  losartan (COZAAR) 100 MG tablet Take 1 tablet (100 mg total) by mouth daily. 09/21/17  Yes Wallis Bamberg, PA-C  metFORMIN (GLUCOPHAGE) 1000 MG tablet Take 1 tablet (1,000 mg total) by mouth 2 (two) times daily with a meal. 09/21/17  Yes Wallis Bamberg, PA-C  sildenafil (VIAGRA) 100 MG tablet Take 1 tablet (100 mg total) by mouth daily as needed for erectile dysfunction. 09/21/17  Yes Wallis Bamberg, PA-C  simvastatin (ZOCOR) 20 MG tablet Take 1 tablet (20 mg total) by mouth every evening. 09/21/17  Yes Wallis Bamberg, PA-C  sitaGLIPtin (JANUVIA) 100 MG tablet Take 1 tablet (100 mg total) by mouth  daily. 09/21/17  Yes Wallis Bamberg, PA-C    Past Medical History:  Diagnosis Date  . Diabetes mellitus without complication (HCC)   . Hyperlipidemia   . Hypertension     Past Surgical History:  Procedure Laterality Date  . CIRCUMCISION      Social History   Tobacco Use  . Smoking status: Never Smoker  . Smokeless tobacco: Never Used  Substance Use Topics  . Alcohol use: No    Alcohol/week: 0.0 oz    Comment: former alcohol use/ not currently    Family History  Problem Relation Age of Onset  . Diabetes Mother   . Hyperlipidemia Mother   . Hypertension Mother   . Diabetes Father   . Cancer Sister 26       uterus?  . Diabetes Sister   . Hypertension Brother   . Diabetes Sister   . Diabetes Sister   . Diabetes Sister   . Diabetes Brother   . Hyperlipidemia Brother   . Hypertension Brother   . Colon cancer Neg Hx     ROS Per hpi  OBJECTIVE:  Blood pressure (!) 138/91, pulse 76, temperature 98.6 F (37 C), temperature source Oral, resp. rate 18, height 5' 6.77" (1.696 m), weight 180 lb (81.6 kg), SpO2 98 %.  Physical Exam  Constitutional: He is oriented  to person, place, and time. He appears well-developed and well-nourished.  HENT:  Head: Normocephalic and atraumatic.  Mouth/Throat: Oropharynx is clear and moist.  Eyes: Pupils are equal, round, and reactive to light. EOM are normal.  Neck: Neck supple.  Cardiovascular: Normal rate and regular rhythm. Exam reveals no gallop and no friction rub.  No murmur heard. Pulmonary/Chest: Effort normal and breath sounds normal. He has no wheezes. He has no rales.  Abdominal: Soft. Bowel sounds are normal. He exhibits no distension and no mass. There is no tenderness. No hernia. Hernia confirmed negative in the right inguinal area and confirmed negative in the left inguinal area.  Genitourinary: Testes normal and penis normal. Cremasteric reflex is present.  Musculoskeletal:       Lumbar back: He exhibits no tenderness,  no bony tenderness and no spasm.  Neurological: He is alert and oriented to person, place, and time. He has normal strength and normal reflexes. Gait normal.  LEFT SLR caused radiation of pain into left groin  Skin: Skin is warm and dry.  Psychiatric: He has a normal mood and affect.  Nursing note and vitals reviewed.     Results for orders placed or performed in visit on 10/27/17 (from the past 24 hour(s))  POCT urinalysis dipstick     Status: None   Collection Time: 10/27/17 10:47 AM  Result Value Ref Range   Color, UA yellow yellow   Clarity, UA clear clear   Glucose, UA negative negative mg/dL   Bilirubin, UA negative negative   Ketones, POC UA negative negative mg/dL   Spec Grav, UA 3.0861.025 5.7841.010 - 1.025   Blood, UA negative negative   pH, UA 5.5 5.0 - 8.0   Protein Ur, POC negative negative mg/dL   Urobilinogen, UA 0.2 0.2 or 1.0 E.U./dL   Nitrite, UA Negative Negative   Leukocytes, UA Negative Negative    ASSESSMENT and PLAN  1. Acute left-sided low back pain, with sciatica presence unspecified Discussed supportive measures, new meds r/se/b and RTC precautions. Patient educational handout given.  2. Groin pain, left - POCT urinalysis dipstick - US Scrotum; Future  Other orders - ibuprofen (ADVIL,MOTRIN) 800 MG tablet; Take 1 tablet (800 mg total) by mouth every 8 (eight) hours as needed. - gabapentin (NEURONTIN) 300 MG capsule; Take 1 capsule (300 mg total) by mouth 3 (three) times daily.  Return if symptoms worsen or fail to improve.    Ricardo LippsIrma M Santiago, MD [facility] [address] [place], [place] [address] Ph.  [phone] Fax [phone]

## 2017-10-27 NOTE — Patient Instructions (Addendum)
I have ordered either a referral or a diagnostic image. Please be mindful that it usually takes about 2 weeks to be called for an appointment. However if you have not be called for your appointment, please call us at [phone] and ask to speak with a referral clerk to further inquire about either your referral of diagnostic image.   IF you received an x-ray today, you will receive an invoice from [facility]. Please contact [facility] at [phone] with questions or concerns regarding your invoice.   IF you received labwork today, you will receive an invoice from [facility]. Please contact [facility] at [phone] with questions or concerns regarding your invoice.   Our billing staff will not be able to assist you with questions regarding bills from these companies.  You will be contacted with the lab results as soon as they are available. The fastest way to get your results is to activate your My Chart account. Instructions are located on the last page of this paperwork. If you have not heard from us regarding the results in 2 weeks, please contact this office.     Back Pain, Adult Back pain is very common. The pain often gets better over time. The cause of back pain is usually not dangerous. Most people can learn to manage their back pain on their own. Follow these instructions at home: Watch your back pain for any changes. The following actions may help to lessen any pain you are feeling:  Stay active. Start with short walks on flat ground if you can. Try to walk farther each day.  Exercise regularly as told by your doctor. Exercise helps your back heal faster. It also helps avoid future injury by keeping your muscles strong and flexible.  Do not sit, drive, or stand in one place for more than 30 minutes.  Do not stay in bed. Resting more than 1-2 days can slow down your recovery.  Be careful when you bend or lift an object. Use good form when lifting: ? Bend at  your knees. ? Keep the object close to your body. ? Do not twist.  Sleep on a firm mattress. Lie on your side, and bend your knees. If you lie on your back, put a pillow under your knees.  Take medicines only as told by your doctor.  Put ice on the injured area. ? Put ice in a plastic bag. ? Place a towel between your skin and the bag. ? Leave the ice on for 20 minutes, 2-3 times a day for the first 2-3 days. After that, you can switch between ice and heat packs.  Avoid feeling anxious or stressed. Find good ways to deal with stress, such as exercise.  Maintain a healthy weight. Extra weight puts stress on your back.  Contact a doctor if:  You have pain that does not go away with rest or medicine.  You have worsening pain that goes down into your legs or buttocks.  You have pain that does not get better in one week.  You have pain at night.  You lose weight.  You have a fever or chills. Get help right away if:  You cannot control when you poop (bowel movement) or pee (urinate).  Your arms or legs feel weak.  Your arms or legs lose feeling (numbness).  You feel sick to your stomach (nauseous) or throw up (vomit).  You have belly (abdominal) pain.  You feel like you may pass out (faint). This information is not  intended to replace advice given to you by your health care provider. Make sure you discuss any questions you have with your health care provider. Document Released: 10/14/2007 Document Revised: 10/03/2015 Document Reviewed: 08/29/2013 Elsevier Interactive Patient Education  [employer].

## 2017-10-29 ENCOUNTER — Telehealth: Payer: Self-pay

## 2017-10-29 DIAGNOSIS — N5082 Scrotal pain: Secondary | ICD-10-CM

## 2017-10-29 DIAGNOSIS — R1032 Left lower quadrant pain: Secondary | ICD-10-CM

## 2017-10-29 MED FILL — SILDENAFIL CITRATE 100 MG T: 100 | 30 days supply | Qty: 4 | Fill #4

## 2017-10-29 NOTE — Telephone Encounter (Signed)
Copied from CRM [phone]. Topic: General - Other >> Oct 29, 2017  9:54 AM Harlan StainsWilliams-Neal, Sade R wrote: Mitzi DavenportShelby from [facility] is calling due to order for ultrasound needs to be rewritten to Scrotum with Doppler  Callback [id number] opt.1 opt.5  Re entered order to show scrotum with doppler.

## 2017-11-03 MED FILL — glipiZIDE 5 MG TABS: 5 | 90 days supply | Qty: 90 | Fill #0

## 2017-11-12 ENCOUNTER — Other Ambulatory Visit: Payer: BLUE CROSS/BLUE SHIELD

## 2017-11-24 ENCOUNTER — Ambulatory Visit
Admission: RE | Admit: 2017-11-24 | Discharge: 2017-11-24 | Disposition: A | Discharge status: Home / Self Care | Payer: BLUE CROSS/BLUE SHIELD | Source: Ambulatory Visit | Attending: Family Medicine | Admitting: Family Medicine

## 2017-11-24 DIAGNOSIS — R1032 Left lower quadrant pain: Secondary | ICD-10-CM

## 2017-11-24 DIAGNOSIS — N5082 Scrotal pain: Secondary | ICD-10-CM

## 2017-11-24 MED FILL — JANUVIA 100 MG TABLET: 100 | 90 days supply | Qty: 90 | Fill #0

## 2017-12-10 MED FILL — SILDENAFIL CITRATE 100 MG T: 100 | 30 days supply | Qty: 4 | Fill #5

## 2017-12-31 MED FILL — LOSARTAN POTASSIUM 100 MG T: 100 | 90 days supply | Qty: 90 | Fill #1

## 2018-01-13 MED FILL — SILDENAFIL CITRATE 100 MG T: 100 | 30 days supply | Qty: 4 | Fill #6

## 2018-01-19 MED FILL — SIMVASTATIN 20 MG TABLET: 20 | 90 days supply | Qty: 90 | Fill #1

## 2018-01-19 MED FILL — metFORMIN HCL 1000 MG TABS: 1000 | 90 days supply | Qty: 180 | Fill #1

## 2018-02-08 MED FILL — glipiZIDE 5 MG TABS: 5 | 90 days supply | Qty: 90 | Fill #1

## 2018-02-24 ENCOUNTER — Other Ambulatory Visit: Payer: Self-pay | Admitting: Urgent Care

## 2018-02-24 DIAGNOSIS — E119 Type 2 diabetes mellitus without complications: Secondary | ICD-10-CM

## 2018-02-24 MED FILL — JANUVIA 100 MG TABLET: 100 | 90 days supply | Qty: 90 | Fill #1

## 2018-03-04 MED FILL — SILDENAFIL CITRATE 100 MG T: 100 | 30 days supply | Qty: 4 | Fill #7

## 2018-04-08 MED FILL — LOSARTAN POTASSIUM 100 MG T: 100 | 90 days supply | Qty: 90 | Fill #2

## 2018-04-22 MED FILL — metFORMIN HCL 1000 MG TABS: 1000 | 90 days supply | Qty: 180 | Fill #2

## 2018-04-22 MED FILL — SIMVASTATIN 20 MG TABLET: 20 | 90 days supply | Qty: 90 | Fill #2

## 2018-05-31 MED FILL — glipiZIDE 5 MG TABS: 5 | 30 days supply | Qty: 30 | Fill #0

## 2018-06-14 ENCOUNTER — Ambulatory Visit: Payer: BLUE CROSS/BLUE SHIELD | Admitting: Family Medicine

## 2018-06-15 ENCOUNTER — Other Ambulatory Visit: Payer: Self-pay

## 2018-06-15 ENCOUNTER — Encounter: Payer: Self-pay | Admitting: Family Medicine

## 2018-06-15 ENCOUNTER — Ambulatory Visit: Payer: BLUE CROSS/BLUE SHIELD | Admitting: Family Medicine

## 2018-06-15 VITALS — BP 138/81 | HR 70 | Temp 98.3°F | Resp 16 | Ht 72.0 in | Wt 180.2 lb

## 2018-06-15 DIAGNOSIS — E1165 Type 2 diabetes mellitus with hyperglycemia: Secondary | ICD-10-CM | POA: Diagnosis not present

## 2018-06-15 DIAGNOSIS — Z23 Encounter for immunization: Secondary | ICD-10-CM

## 2018-06-15 DIAGNOSIS — I10 Essential (primary) hypertension: Secondary | ICD-10-CM

## 2018-06-15 DIAGNOSIS — Z794 Long term (current) use of insulin: Secondary | ICD-10-CM

## 2018-06-15 DIAGNOSIS — E785 Hyperlipidemia, unspecified: Secondary | ICD-10-CM

## 2018-06-15 LAB — LIPID PANEL
CHOLESTEROL TOTAL: 131 mg/dL (ref 100–199)
Chol/HDL Ratio: 2.9 ratio (ref 0.0–5.0)
HDL: 45 mg/dL (ref >39)
LDL Calculated: 69 mg/dL (ref 0–99)
Triglycerides: 83 mg/dL (ref 0–149)
VLDL Cholesterol Cal: 17 mg/dL (ref 5–40)

## 2018-06-15 LAB — COMPREHENSIVE METABOLIC PANEL
ALT: 36 IU/L (ref 0–44)
AST: 22 IU/L (ref 0–40)
Albumin/Globulin Ratio: 1.9 (ref 1.2–2.2)
Albumin: 4.5 g/dL (ref 3.8–4.9)
Alkaline Phosphatase: 91 IU/L (ref 39–117)
BUN/Creatinine Ratio: 11 (ref 10–24)
BUN: 10 mg/dL (ref 8–27)
Bilirubin Total: 0.3 mg/dL (ref 0.0–1.2)
CO2: 20 mmol/L (ref 20–29)
CREATININE: 0.91 mg/dL (ref 0.76–1.27)
Calcium: 9.8 mg/dL (ref 8.6–10.2)
Chloride: 103 mmol/L (ref 96–106)
GFR calc Af Amer: 106 mL/min/{1.73_m2} (ref >59)
GFR, EST NON AFRICAN AMERICAN: 91 mL/min/{1.73_m2} (ref >59)
Globulin, Total: 2.4 g/dL (ref 1.5–4.5)
Glucose: 267 mg/dL — ABNORMAL HIGH (ref 65–99)
Potassium: 4.6 mmol/L (ref 3.5–5.2)
Sodium: 139 mmol/L (ref 134–144)
Total Protein: 6.9 g/dL (ref 6.0–8.5)

## 2018-06-15 LAB — POCT GLYCOSYLATED HEMOGLOBIN (HGB A1C): Hemoglobin A1C: 13.1 % — AB (ref 4.0–5.6)

## 2018-06-15 LAB — GLUCOSE, POCT (MANUAL RESULT ENTRY): POC GLUCOSE: 292 mg/dL — AB (ref 70–99)

## 2018-06-15 MED ORDER — METFORMIN HCL 1000 MG PO TABS: 1000.0000 mg | ORAL_TABLET | Freq: Two times a day (BID) | ORAL | 1 refills | Status: DC

## 2018-06-15 MED ORDER — BLOOD GLUCOSE METER KIT: PACK | 0 refills | Status: DC

## 2018-06-15 MED ORDER — SITAGLIPTIN PHOSPHATE 100 MG PO TABS: 100.0000 mg | ORAL_TABLET | Freq: Every day | ORAL | 1 refills | Status: DC

## 2018-06-15 MED ORDER — PEN NEEDLES 32G X 4 MM MISC: 1.0000 "application " | Freq: Every day | 3 refills | Status: DC

## 2018-06-15 MED ORDER — INSULIN GLARGINE 100 UNIT/ML SOLOSTAR PEN: 10.0000 [IU] | PEN_INJECTOR | Freq: Every day | SUBCUTANEOUS | 3 refills | Status: DC

## 2018-06-15 MED ORDER — LOSARTAN POTASSIUM 100 MG PO TABS: 100.0000 mg | ORAL_TABLET | Freq: Every day | ORAL | 1 refills | Status: DC

## 2018-06-15 MED ORDER — SIMVASTATIN 20 MG PO TABS: 20.0000 mg | ORAL_TABLET | Freq: Every evening | ORAL | 1 refills | Status: DC

## 2018-06-15 MED FILL — UNIFINE PENTIPS 32GX5/32: 32G X 4 MM | 30 days supply | Qty: 100 | Fill #0

## 2018-06-15 MED FILL — LANTUS SOLOSTAR 100 UNITS/M: 100 | 28 days supply | Qty: 3 | Fill #0

## 2018-06-15 MED FILL — LOSARTAN POTASSIUM 100 MG T: 100 | 90 days supply | Qty: 90 | Fill #0

## 2018-06-15 MED FILL — UNIFINE PENTIPS 32GX5/32": 32G X 4 MM | 30 days supply | Qty: 100 | Fill #0

## 2018-06-15 MED FILL — JANUVIA 100 MG TABLET: 100 | 90 days supply | Qty: 90 | Fill #0

## 2018-06-15 NOTE — Progress Notes (Signed)
Subjective:    Patient ID: Ricardo Mills, male    DOB: 06-Jun-1955, 63 y.o.   MRN: [id number]  HPI Ricardo Mills is a 63 y.o. male Presents today for: Chief Complaint  Patient presents with  . Diabetes    need refill on  bp diabetes meds   . Hypertension    patient do not have any other issus at this time  . Hyperlipidemia    refills   Hypertension: BP Readings from Last 3 Encounters:  06/15/18 138/81  10/27/17 (!) 138/91  09/21/17 126/78   Lab Results  Component Value Date   CREATININE 0.95 09/21/2017  no home BP readings.  No new med side effects. Losartan 152m qd.   Hyperlipidemia:  Lab Results  Component Value Date   CHOL 120 09/21/2017   HDL 47 09/21/2017   LDLCALC 64 09/21/2017   TRIG 46 09/21/2017   CHOLHDL 2.6 09/21/2017   Lab Results  Component Value Date   ALT 22 09/21/2017   AST 17 09/21/2017   ALKPHOS 71 09/21/2017   BILITOT 0.3 09/21/2017  simvastatin. No new myalgias/side effects. Has been taking daily.   Diabetes:  On metformin 10052mBiD, no missed doses, off glipizide - ran out, [id number].  No home readings.  No new side effects.  Microalbumin - nl 09/2016.  Optho, foot exam, pneumovax:up to date Exercising 5d/week. No changes.  Wt Readings from Last 3 Encounters:  06/15/18 180 lb 3.2 oz (81.7 kg)  10/27/17 180 lb (81.6 kg)  09/21/17 180 lb 6.4 oz (81.8 kg)    Lab Results  Component Value Date   HGBA1C 7.2 (H) 09/21/2017   HGBA1C 11.9 (H) 04/23/2017   HGBA1C 7.2 09/18/2016   Lab Results  Component Value Date   MICROALBUR 0.4 08/21/2015   [place] 09/21/2017   CREATININE 0.95 09/21/2017      Patient Active Problem List   Diagnosis Date Noted  . Halitosis 03/28/2012  . Diabetes ([place]/02/2012  . HTN (hypertension) 06/21/2011  . Hyperlipidemia LDL goal <70 06/21/2011  . ED (erectile dysfunction) 06/21/2011   Past Medical History:  Diagnosis Date  . Diabetes mellitus without complication ([place] .  Hyperlipidemia   . Hypertension    Past Surgical History:  Procedure Laterality Date  . CIRCUMCISION     No Known Allergies Prior to Admission medications   Medication Sig Start Date End Date Taking? Authorizing Provider  glipiZIDE (GLUCOTROL) 5 MG tablet TAKE 1 TABLET BY MOUTH ONCE DAILY BEFORE BREAKFAST (MAKE SURE TO EAT WITH THIS MEDICATION) 02/25/18  Yes [place] GuysD  glucose blood (ONE TOUCH ULTRA TEST) test strip 1 each by Other route daily. And lancets 1/day 250.02 03/09/13  Yes EllRenato ShinD  losartan (COZAAR) 100 MG tablet Take 1 tablet (100 mg total) by mouth daily. 09/21/17  Yes ManJaynee EaglesA-C  metFORMIN (GLUCOPHAGE) 1000 MG tablet Take 1 tablet (1,000 mg total) by mouth 2 (two) times daily with a meal. 09/21/17  Yes ManJaynee EaglesA-C  simvastatin (ZOCOR) 20 MG tablet Take 1 tablet (20 mg total) by mouth every evening. 09/21/17  Yes ManJaynee EaglesA-C  sitaGLIPtin (JANUVIA) 100 MG tablet Take 1 tablet (100 mg total) by mouth daily. 09/21/17  Yes ManJaynee EaglesA-C   Social History   Socioeconomic History  . Marital status: Married    Spouse name: VirBoeing Number of children: 4  . Years of education: 12 100 Highest  education level: Not on file  Occupational History  . Occupation: Systems developer in boxes    [employer] information centre [employer]-13 hours/day, 3 days/week  Social Needs  . Financial resource strain: Not on file  . Food insecurity:    Worry: Not on file    Inability: Not on file  . Transportation needs:    Medical: Not on file    Non-medical: Not on file  Tobacco Use  . Smoking status: Never Smoker  . Smokeless tobacco: Never Used  Substance and Sexual Activity  . Alcohol use: No    Alcohol/week: 0.0 standard drinks    Comment: former alcohol use/ not currently  . Drug use: No  . Sexual activity: Yes    Partners: Female  Lifestyle  . Physical activity:    Days per week: Not on file    Minutes per session: Not on file  . Stress:  Not on file  Relationships  . Social connections:    Talks on phone: Not on file    Gets together: Not on file    Attends religious service: Not on file    Active member of club or organization: Not on file    Attends meetings of clubs or organizations: Not on file    Relationship status: Not on file  . Intimate partner violence:    Fear of current or ex partner: Not on file    Emotionally abused: Not on file    Physically abused: Not on file    Forced sexual activity: Not on file  Other Topics Concern  . Not on file  Social History Narrative   Ricardo Mills Parents   Lives with his wife.   Regular exercise: occasionally   Caffeine use: coffee daily    Review of Systems  Constitutional: Negative for fatigue and unexpected weight change.  Eyes: Negative for visual disturbance.  Respiratory: Negative for cough, chest tightness and shortness of breath.   Cardiovascular: Negative for chest pain, palpitations and leg swelling.  Gastrointestinal: Negative for abdominal pain and blood in stool.  Neurological: Negative for dizziness, light-headedness and headaches.       Objective:   Physical Exam Vitals signs reviewed.  Constitutional:      Appearance: He is well-developed.  HENT:     Head: Normocephalic and atraumatic.  Eyes:     Pupils: Pupils are equal, round, and reactive to light.  Neck:     Vascular: No carotid bruit or JVD.  Cardiovascular:     Rate and Rhythm: Normal rate and regular rhythm.     Heart sounds: Normal heart sounds. No murmur.  Pulmonary:     Effort: Pulmonary effort is normal.     Breath sounds: Normal breath sounds. No rales.  Skin:    General: Skin is warm and dry.  Neurological:     Mental Status: He is alert and oriented to person, place, and time.    Vitals:   06/15/18 1035 06/15/18 1046  BP: (!) 150/90 138/81  Pulse: 70   Resp: 16   Temp: 98.3 F (36.8 C)   TempSrc: Oral   SpO2: 98%   Weight: 180 lb 3.2 oz (81.7 kg)   Height: 6' (1.829  m)     Results for orders placed or performed in visit on 06/15/18  POCT glucose (manual entry)  Result Value Ref Range   POC Glucose 292 (A) 70 - 99 mg/dl  POCT glycosylated hemoglobin (Hb A1C)  Result Value Ref Range   Hemoglobin A1C 13.1 (  A) 4.0 - 5.6 %   HbA1c POC (<> result, manual entry)     HbA1c, POC (prediabetic range)     HbA1c, POC (controlled diabetic range)         Assessment & Plan:    BOMANI OOMMEN is a 63 y.o. male Type 2 diabetes mellitus with hyperglycemia, with long-term current use of insulin ([place]) - Plan: POCT glucose (manual entry), Microalbumin/Creatinine Ratio, Urine, Comprehensive metabolic panel, HM Diabetes Foot Exam, POCT glycosylated hemoglobin (Hb A1C), Insulin Glargine (LANTUS SOLOSTAR) 100 UNIT/ML Solostar Pen, Insulin Pen Needle (PEN NEEDLES) 32G X 4 MM MISC, blood glucose meter kit and supplies, metFORMIN (GLUCOPHAGE) 1000 MG tablet, sitaGLIPtin (JANUVIA) 100 MG tablet  -Unfortunately uncontrolled with A1c as above.  I do not anticipate significant control could be obtained with oral medications only as he has been compliant with both metformin and Januvia.  -Start Lantus 10 units per night initially then increase by 2 units every 3 days until fastings below 200.  Then remain at that dose.  Hypoglycemia precautions and action plan discussed if those were to occur  -Continue metformin, Januvia same dose for now.  Prescription for meter given for home monitoring.  Recheck in 2 weeks for assessment of home readings and further adjustments of medication.  Hyperlipidemia LDL goal <70 - Plan: Lipid Panel, simvastatin (ZOCOR) 20 MG tablet  -Continue Zocor for now, labs pending.  Consider Crestor or Lipitor if elevated LDL   Need for prophylactic vaccination with combined diphtheria-tetanus-pertussis (DTP) vaccine - Plan: Tdap vaccine greater than or equal to 7yo IM  Essential hypertension - Plan: losartan (COZAAR) 100 MG tablet  -Stable on losartan, continue  same dose.  Meds ordered this encounter  Medications  . Insulin Glargine (LANTUS SOLOSTAR) 100 UNIT/ML Solostar Pen    Sig: Inject 10 Units into the skin daily.    Dispense:  5 pen    Refill:  3  . Insulin Pen Needle (PEN NEEDLES) 32G X 4 MM MISC    Sig: 1 application by Does not apply route daily.    Dispense:  100 each    Refill:  3  . blood glucose meter kit and supplies    Sig: Dispense based on patient and insurance preference. Use up to four times daily as directed. (FOR ICD-10 E10.9, E11.9).    Dispense:  1 each    Refill:  0    Order Specific Question:   Number of strips    Answer:   100    Order Specific Question:   Number of lancets    Answer:   100  . losartan (COZAAR) 100 MG tablet    Sig: Take 1 tablet (100 mg total) by mouth daily.    Dispense:  90 tablet    Refill:  1  . metFORMIN (GLUCOPHAGE) 1000 MG tablet    Sig: Take 1 tablet (1,000 mg total) by mouth 2 (two) times daily with a meal.    Dispense:  180 tablet    Refill:  1  . simvastatin (ZOCOR) 20 MG tablet    Sig: Take 1 tablet (20 mg total) by mouth every evening.    Dispense:  90 tablet    Refill:  1  . sitaGLIPtin (JANUVIA) 100 MG tablet    Sig: Take 1 tablet (100 mg total) by mouth daily.    Dispense:  90 tablet    Refill:  1   Patient Instructions    Unfortunately blood sugar is very high today,  and will need to start insulin in addition to your other medications.  Continue metformin and Januvia same dose.  Start Lantus 10 units per night, monitor your blood sugars fasting and 2 hours after meals.  In the next 3 days if the fasting blood sugars are not below 200 increase by 2 units (12 units total).  Okay to increase by 2 additional units every 3 days until the blood sugars are remaining under 200.  Once readings are below 200, stay at that dose.  Recheck in 2 weeks with home blood sugar readings, sooner if needed.   Insulin Injection Instructions, Using Insulin Pens, Adult A subcutaneous injection  is a shot of medicine that is injected into the layer of fat and tissue between skin and muscle. People with type 1 diabetes must take insulin because their bodies do not make it. People with type 2 diabetes may need to take insulin. There are many different types of insulin. The type of insulin that you take may determine how many injections you give yourself and when you need to give the injections. Supplies needed:  Soap and water to wash hands.  Your insulin pen.  A new, unused needle.  Alcohol wipes.  A disposal container that is meant for sharp items (sharps container), such as an empty plastic bottle with a cover. How to choose a site for injection The body absorbs insulin differently, depending on where the insulin is injected (injection site). It is best to inject insulin into the same body area each time (for example, always in the abdomen), but you should use a different spot in that area for each injection. Do not inject the insulin in the same spot each time. There are five main areas that can be used for injecting. These areas include:  Abdomen. This is the preferred area.  Front of thigh.  Upper, outer side of thigh.  Upper, outer side of arm.  Upper, outer part of buttock. How to use an insulin pen  First, follow the steps for Get ready, then continue with the steps for Inject the insulin. Get ready 1. Wash your hands with soap and water. If soap and water are not available, use hand sanitizer. 2. Before you give yourself an insulin injection, be sure to test your blood sugar level (blood glucose level) and write down that number. Follow any instructions from your health care provider about what to do if your blood glucose level is higher or lower than your normal range. 3. Check the expiration date and the type of insulin that is in the pen. 4. If you are using CLEAR insulin, check to see that it is clear and free of clumps. 5. If you are using CLOUDY insulin, do not  shake the pen to get the injection ready. Instead, get it ready in one of these ways: ? Gently roll the pen between your palms several times. ? Tip the pen up and down several times. 6. Remove the cap from the insulin pen. 7. Use an alcohol wipe to clean the rubber tip of the pen. 8. Remove the protective paper tab from the disposable needle. Do not let the needle touch anything. 9. Screw a new, unused needle onto the pen. 10. Remove the outer plastic needle cover. Do not throw away the outer plastic cover yet. ? If the pen uses a special safety needle, leave the inner needle shield in place. ? If the pen does not use a special safety needle, remove the inner  plastic cover from the needle. 11. Follow the manufacturer's instructions to prime the insulin pen with the volume of insulin needed. Hold the pen with the needle pointing up, and push the button on the opposite end of the pen until a drop of insulin appears at the needle tip. If no insulin appears, repeat this step. 12. Turn the button (dial) to the number of units of insulin that you will be injecting. Inject the insulin 1. Use an alcohol wipe to clean the site where you will be injecting the needle. Let the site air-dry. 2. Hold the pen in the palm of your writing hand like a pencil. 3. If directed by your health care provider, use your other hand to pinch and hold about an inch (2.5 cm) of skin at the injection site. Do not directly touch the cleaned part of the skin. 4. Gently but quickly, use your writing hand to put the needle straight into the skin. The needle should be at a 90-degree angle (perpendicular) to the skin. 5. When the needle is completely inserted into the skin, use your thumb or index finger of your writing hand to push the top button of the pen down all the way to inject the insulin. 6. Let go of the skin that you are pinching. Continue to hold the pen in place with your writing hand. 7. Wait 10 seconds, then pull the  needle straight out of the skin. This will allow all of the insulin to go from the pen and needle into your body. 8. Carefully put the larger (outer) plastic cover of the needle back over the needle, then unscrew the capped needle and discard it in a sharps container, such as an empty plastic bottle with a cover. 9. Put the plastic cap back on the insulin pen. How to throw away supplies  Discard all used needles in a puncture-proof sharps disposal container. You can ask your local pharmacy about where you can get this kind of disposal container, or you can use an empty plastic liquid laundry detergent bottle that has a cover.  Follow the disposal regulations for the area where you live. Do not use any needle more than one time.  Throw away empty disposable pens in the regular trash. Questions to ask your health care provider  How often should I be taking insulin?  How often should I check my blood glucose?  What amount of insulin should I be taking at each time?  What are the side effects?  What should I do if my blood glucose is too high?  What should I do if my blood glucose is too low?  What should I do if I forget to take my insulin?  What number should I call if I have questions? Where to find more information  American Diabetes Association (ADA): www.diabetes.org  American Association of Diabetes Educators (AADE) Patient Resources: https://www.diabeteseducator.org Summary  A subcutaneous injection is a shot of medicine that is injected into the layer of fat and tissue between skin and muscle.  Before you give yourself an insulin injection, be sure to test your blood sugar level (blood glucose level) and write down that number.  Check the expiration date and the type of insulin that is in the pen. The type of insulin that you take may determine how many injections you give yourself and when you need to give the injections.  It is best to inject insulin into the same body  area each time (for example, always  in the abdomen), but you should use a different spot in that area for each injection. This information is not intended to replace advice given to you by your health care provider. Make sure you discuss any questions you have with your health care provider. Document Released: 05/31/2015 Document Revised: 05/17/2017 Document Reviewed: 05/31/2015 Elsevier Interactive Patient Education  [employer].    If you have lab work done today you will be contacted with your lab results within the next 2 weeks.  If you have not heard from [place] then please contact us. The fastest way to get your results is to register for My Chart.   IF you received an x-ray today, you will receive an invoice from [facility]. Please contact [facility] at [phone] with questions or concerns regarding your invoice.   IF you received labwork today, you will receive an invoice from [facility]. Please contact [facility] at [phone] with questions or concerns regarding your invoice.   Our billing staff will not be able to assist you with questions regarding bills from these companies.  You will be contacted with the lab results as soon as they are available. The fastest way to get your results is to activate your My Chart account. Instructions are located on the last page of this paperwork. If you have not heard from [place] regarding the results in 2 weeks, please contact this office.       Signed,   Merri Ray, MD [facility].  06/15/18 11:55 AM

## 2018-06-15 NOTE — Patient Instructions (Addendum)
Unfortunately blood sugar is very high today, and will need to start insulin in addition to your other medications.  Continue metformin and Januvia same dose.  Start Lantus 10 units per night, monitor your blood sugars fasting and 2 hours after meals.  In the next 3 days if the fasting blood sugars are not below 200 increase by 2 units (12 units total).  Okay to increase by 2 additional units every 3 days until the blood sugars are remaining under 200.  Once readings are below 200, stay at that dose.  Recheck in 2 weeks with home blood sugar readings, sooner if needed.   Insulin Injection Instructions, Using Insulin Pens, Adult A subcutaneous injection is a shot of medicine that is injected into the layer of fat and tissue between skin and muscle. People with type 1 diabetes must take insulin because their bodies do not make it. People with type 2 diabetes may need to take insulin. There are many different types of insulin. The type of insulin that you take may determine how many injections you give yourself and when you need to give the injections. Supplies needed:  Soap and water to wash hands.  Your insulin pen.  A new, unused needle.  Alcohol wipes.  A disposal container that is meant for sharp items (sharps container), such as an empty plastic bottle with a cover. How to choose a site for injection The body absorbs insulin differently, depending on where the insulin is injected (injection site). It is best to inject insulin into the same body area each time (for example, always in the abdomen), but you should use a different spot in that area for each injection. Do not inject the insulin in the same spot each time. There are five main areas that can be used for injecting. These areas include:  Abdomen. This is the preferred area.  Front of thigh.  Upper, outer side of thigh.  Upper, outer side of arm.  Upper, outer part of buttock. How to use an insulin pen  First, follow the  steps for Get ready, then continue with the steps for Inject the insulin. Get ready 1. Wash your hands with soap and water. If soap and water are not available, use hand sanitizer. 2. Before you give yourself an insulin injection, be sure to test your blood sugar level (blood glucose level) and write down that number. Follow any instructions from your health care provider about what to do if your blood glucose level is higher or lower than your normal range. 3. Check the expiration date and the type of insulin that is in the pen. 4. If you are using CLEAR insulin, check to see that it is clear and free of clumps. 5. If you are using CLOUDY insulin, do not shake the pen to get the injection ready. Instead, get it ready in one of these ways: ? Gently roll the pen between your palms several times. ? Tip the pen up and down several times. 6. Remove the cap from the insulin pen. 7. Use an alcohol wipe to clean the rubber tip of the pen. 8. Remove the protective paper tab from the disposable needle. Do not let the needle touch anything. 9. Screw a new, unused needle onto the pen. 10. Remove the outer plastic needle cover. Do not throw away the outer plastic cover yet. ? If the pen uses a special safety needle, leave the inner needle shield in place. ? If the pen does not use a  special safety needle, remove the inner plastic cover from the needle. 11. Follow the manufacturer's instructions to prime the insulin pen with the volume of insulin needed. Hold the pen with the needle pointing up, and push the button on the opposite end of the pen until a drop of insulin appears at the needle tip. If no insulin appears, repeat this step. 12. Turn the button (dial) to the number of units of insulin that you will be injecting. Inject the insulin 1. Use an alcohol wipe to clean the site where you will be injecting the needle. Let the site air-dry. 2. Hold the pen in the palm of your writing hand like a  pencil. 3. If directed by your health care provider, use your other hand to pinch and hold about an inch (2.5 cm) of skin at the injection site. Do not directly touch the cleaned part of the skin. 4. Gently but quickly, use your writing hand to put the needle straight into the skin. The needle should be at a 90-degree angle (perpendicular) to the skin. 5. When the needle is completely inserted into the skin, use your thumb or index finger of your writing hand to push the top button of the pen down all the way to inject the insulin. 6. Let go of the skin that you are pinching. Continue to hold the pen in place with your writing hand. 7. Wait 10 seconds, then pull the needle straight out of the skin. This will allow all of the insulin to go from the pen and needle into your body. 8. Carefully put the larger (outer) plastic cover of the needle back over the needle, then unscrew the capped needle and discard it in a sharps container, such as an empty plastic bottle with a cover. 9. Put the plastic cap back on the insulin pen. How to throw away supplies  Discard all used needles in a puncture-proof sharps disposal container. You can ask your local pharmacy about where you can get this kind of disposal container, or you can use an empty plastic liquid laundry detergent bottle that has a cover.  Follow the disposal regulations for the area where you live. Do not use any needle more than one time.  Throw away empty disposable pens in the regular trash. Questions to ask your health care provider  How often should I be taking insulin?  How often should I check my blood glucose?  What amount of insulin should I be taking at each time?  What are the side effects?  What should I do if my blood glucose is too high?  What should I do if my blood glucose is too low?  What should I do if I forget to take my insulin?  What number should I call if I have questions? Where to find more  information  American Diabetes Association (ADA): www.diabetes.org  American Association of Diabetes Educators (AADE) Patient Resources: https://www.diabeteseducator.org Summary  A subcutaneous injection is a shot of medicine that is injected into the layer of fat and tissue between skin and muscle.  Before you give yourself an insulin injection, be sure to test your blood sugar level (blood glucose level) and write down that number.  Check the expiration date and the type of insulin that is in the pen. The type of insulin that you take may determine how many injections you give yourself and when you need to give the injections.  It is best to inject insulin into the same body  area each time (for example, always in the abdomen), but you should use a different spot in that area for each injection. This information is not intended to replace advice given to you by your health care provider. Make sure you discuss any questions you have with your health care provider. Document Released: 05/31/2015 Document Revised: 05/17/2017 Document Reviewed: 05/31/2015 Elsevier Interactive Patient Education  [employer]2019 Elsevier Inc.    If you have lab work done today you will be contacted with your lab results within the next 2 weeks.  If you have not heard from us then please contact us. The fastest way to get your results is to register for My Chart.   IF you received an x-ray today, you will receive an invoice from [facility]. Please contact [facility] at [phone] with questions or concerns regarding your invoice.   IF you received labwork today, you will receive an invoice from [facility]. Please contact [facility] at [phone] with questions or concerns regarding your invoice.   Our billing staff will not be able to assist you with questions regarding bills from these companies.  You will be contacted with the lab results as soon as they are available. The fastest way to get your  results is to activate your My Chart account. Instructions are located on the last page of this paperwork. If you have not heard from us regarding the results in 2 weeks, please contact this office.

## 2018-06-16 LAB — MICROALBUMIN / CREATININE URINE RATIO
Creatinine, Urine: 96.9 mg/dL
MICROALB/CREAT RATIO: 14 mg/g{creat} (ref 0–29)
MICROALBUM., U, RANDOM: 13.9 ug/mL

## 2018-06-28 ENCOUNTER — Encounter: Payer: Self-pay | Admitting: Radiology

## 2018-06-30 LAB — HM DIABETES EYE EXAM

## 2018-07-04 ENCOUNTER — Telehealth: Payer: Self-pay | Admitting: Family Medicine

## 2018-07-04 NOTE — Telephone Encounter (Signed)
Copied from CRM [id number]. Topic: Quick Communication - See Telephone Encounter >> Jul 04, 2018  9:18 AM Arlyss Gandy, NT wrote: CRM for notification. See Telephone encounter for: 07/04/18. Pts wife calling and states husband has not been abl to get his glucometer due to pharmacy needs to know which machine the dr ordered and which one the insurance covers. Please advise.

## 2018-07-05 ENCOUNTER — Other Ambulatory Visit: Payer: Self-pay | Admitting: Emergency Medicine

## 2018-07-05 DIAGNOSIS — Z794 Long term (current) use of insulin: Secondary | ICD-10-CM

## 2018-07-05 DIAGNOSIS — E1165 Type 2 diabetes mellitus with hyperglycemia: Secondary | ICD-10-CM

## 2018-07-05 MED ORDER — BLOOD GLUCOSE METER KIT: PACK | 0 refills | Status: DC

## 2018-07-05 MED FILL — FREESTYLE LIBRE 14 DAY READ: 28 days supply | Qty: 1 | Fill #0

## 2018-07-05 MED FILL — FREESTYLE LIBRE 14 DAY SENS: 28 days supply | Qty: 2 | Fill #0

## 2018-07-25 MED FILL — SIMVASTATIN 20 MG TABLET: 20 | 90 days supply | Qty: 90 | Fill #3

## 2018-07-25 MED FILL — metFORMIN HCL 1000 MG TABS: 1000 | 90 days supply | Qty: 180 | Fill #3

## 2018-07-25 MED FILL — LANTUS SOLOSTAR 100 UNITS/M: 100 | 28 days supply | Qty: 3 | Fill #1 | Status: TO

## 2018-07-27 ENCOUNTER — Ambulatory Visit: Payer: BLUE CROSS/BLUE SHIELD | Admitting: Family Medicine

## 2018-08-24 MED FILL — LANTUS SOLOSTAR 100 UNITS/M: 100 | 28 days supply | Qty: 3 | Fill #0

## 2018-09-09 ENCOUNTER — Ambulatory Visit: Payer: BLUE CROSS/BLUE SHIELD | Admitting: Family Medicine

## 2018-09-09 ENCOUNTER — Other Ambulatory Visit: Payer: Self-pay

## 2018-09-09 ENCOUNTER — Telehealth (INDEPENDENT_AMBULATORY_CARE_PROVIDER_SITE_OTHER): Payer: BLUE CROSS/BLUE SHIELD | Admitting: Family Medicine

## 2018-09-09 DIAGNOSIS — Z794 Long term (current) use of insulin: Secondary | ICD-10-CM

## 2018-09-09 DIAGNOSIS — E1165 Type 2 diabetes mellitus with hyperglycemia: Secondary | ICD-10-CM | POA: Diagnosis not present

## 2018-09-09 DIAGNOSIS — E785 Hyperlipidemia, unspecified: Secondary | ICD-10-CM

## 2018-09-09 DIAGNOSIS — I10 Essential (primary) hypertension: Secondary | ICD-10-CM

## 2018-09-09 MED ORDER — SIMVASTATIN 20 MG PO TABS: 20.0000 mg | ORAL_TABLET | Freq: Every evening | ORAL | 1 refills | Status: DC

## 2018-09-09 MED ORDER — METFORMIN HCL 1000 MG PO TABS: 1000.0000 mg | ORAL_TABLET | Freq: Two times a day (BID) | ORAL | 1 refills | Status: DC

## 2018-09-09 MED ORDER — INSULIN GLARGINE 100 UNIT/ML SOLOSTAR PEN: 30.0000 [IU] | PEN_INJECTOR | Freq: Every day | SUBCUTANEOUS | 3 refills | Status: DC

## 2018-09-09 MED ORDER — BLOOD GLUCOSE METER KIT: PACK | 11 refills | Status: AC

## 2018-09-09 MED ORDER — LOSARTAN POTASSIUM 100 MG PO TABS: 100.0000 mg | ORAL_TABLET | Freq: Every day | ORAL | 1 refills | Status: DC

## 2018-09-09 MED ORDER — SITAGLIPTIN PHOSPHATE 100 MG PO TABS: 100.0000 mg | ORAL_TABLET | Freq: Every day | ORAL | 1 refills | Status: DC

## 2018-09-09 MED ORDER — SILDENAFIL CITRATE 20 MG PO TABS: ORAL_TABLET | ORAL | 11 refills | Status: DC

## 2018-09-09 MED FILL — JANUVIA 100 MG TABLET: 100 | 90 days supply | Qty: 90 | Fill #0

## 2018-09-09 MED FILL — SILDENAFIL CITRATE 20 MG TA: 20 | 10 days supply | Qty: 30 | Fill #0

## 2018-09-09 MED FILL — LOSARTAN POTASSIUM 100 MG T: 100 | 30 days supply | Qty: 30 | Fill #0

## 2018-09-09 NOTE — Progress Notes (Signed)
No medical concerns at this time. Refill on viagra only sent to the [facility]. He has not been able to chk his blood sugar. Has the freestyle device in the left arm, says he has never been able to get the device to work.will be following up with that. Does not chk bp.

## 2018-09-09 NOTE — Progress Notes (Signed)
Virtual Visit Note  I connected with patient on 09/09/18 at 958am by phone and verified that I am speaking with the correct person using two identifiers. Ricardo Mills is currently located at home and patient is currently with them during visit. The provider, Rutherford Guys, MD is located in their office at time of visit.  I discussed the limitations, risks, security and privacy concerns of performing an evaluation and management service by telephone and the availability of in person appointments. I also discussed with the patient that there may be a patient responsible charge related to this service. The patient expressed understanding and agreed to proceed.   CC: followup on DM/HTN/HLP  HPI ? 63 yo M with PMH DM2, HTN, HLP  Last OV Feb 2020, Dr Carlota Raspberry Started lantus and CGM  He feels that things are overall going well He has not been able to use CGM Doing lantus 10 units every night, metformin BID and Januvia once a day Denies polydipsia, polyuria He does occ have difficulty with initiation of urination Denies any nausea, vomiting, abd pain Denies any fever, chills Has had intermittent night sweats Denies any cough, SOB Denies any chest pain, palpitations, edema Denies any dizziness, headaches Has seen eye doctor since last OV due to vision changes, got a new rx for eyeglasses, was not informed of any concerning findings, [employer] [place], will eventually need cataract surgery  BP Readings from Last 3 Encounters:  06/15/18 138/81  10/27/17 (!) 138/91  09/21/17 126/78    Lab Results  Component Value Date   HGBA1C 13.1 (A) 06/15/2018   HGBA1C 7.2 (H) 09/21/2017   HGBA1C 11.9 (H) 04/23/2017   Lab Results  Component Value Date   MICROALBUR 0.4 08/21/2015   LDLCALC 69 06/15/2018   CREATININE 0.91 06/15/2018    No Known Allergies  Prior to Admission medications   Medication Sig Start Date End Date Taking? Authorizing Provider  Continuous Blood Gluc Receiver  (FREESTYLE LIBRE 14 DAY READER) DEVI  07/05/18   [provider]  glucose blood (ONE TOUCH ULTRA TEST) test strip 1 each by Other route daily. And lancets 1/day 250.02 03/09/13   Renato Shin, MD  Insulin Glargine (LANTUS SOLOSTAR) 100 UNIT/ML Solostar Pen Inject 10 Units into the skin daily. 06/15/18   Wendie Agreste, MD  Insulin Pen Needle (PEN NEEDLES) 32G X 4 MM MISC 1 application by Does not apply route daily. 06/15/18   Wendie Agreste, MD  losartan (COZAAR) 100 MG tablet Take 1 tablet (100 mg total) by mouth daily. 06/15/18   Wendie Agreste, MD  metFORMIN (GLUCOPHAGE) 1000 MG tablet Take 1 tablet (1,000 mg total) by mouth 2 (two) times daily with a meal. 06/15/18   Wendie Agreste, MD  simvastatin (ZOCOR) 20 MG tablet Take 1 tablet (20 mg total) by mouth every evening. 06/15/18   Wendie Agreste, MD  sitaGLIPtin (JANUVIA) 100 MG tablet Take 1 tablet (100 mg total) by mouth daily. 06/15/18   Wendie Agreste, MD    Past Medical History:  Diagnosis Date  . Diabetes mellitus without complication ([place])   . Hyperlipidemia   . Hypertension     Past Surgical History:  Procedure Laterality Date  . CIRCUMCISION      Social History   Tobacco Use  . Smoking status: Never Smoker  . Smokeless tobacco: Never Used  Substance Use Topics  . Alcohol use: No    Alcohol/week: 0.0 standard drinks    Comment: former  alcohol use/ not currently    Family History  Problem Relation Age of Onset  . Diabetes Mother   . Hyperlipidemia Mother   . Hypertension Mother   . Diabetes Father   . Cancer Sister 68       uterus?  . Diabetes Sister   . Hypertension Brother   . Diabetes Sister   . Diabetes Sister   . Diabetes Sister   . Diabetes Brother   . Hyperlipidemia Brother   . Hypertension Brother   . Colon cancer Neg Hx     ROS Per hpi  Objective  Vitals as reported by the patient: none   ASSESSMENT and PLAN  1. Type 2 diabetes mellitus with hyperglycemia, with long-term  current use of insulin (HCC) Not controlled. Discussed importance of LFM. Discussed titration of lantus to fasting goal 90-130 and importance of checking CBGs, rx for glucometer given.  - sitaGLIPtin (JANUVIA) 100 MG tablet; Take 1 tablet (100 mg total) by mouth daily. - metFORMIN (GLUCOPHAGE) 1000 MG tablet; Take 1 tablet (1,000 mg total) by mouth 2 (two) times daily with a meal. - Insulin Glargine (LANTUS SOLOSTAR) 100 UNIT/ML Solostar Pen; Inject 30 Units into the skin daily. - Hemoglobin A1c; Future - Comprehensive metabolic panel; Future - TSH; Future  2. Hyperlipidemia LDL goal <70 Controlled. Continue current regime.  - simvastatin (ZOCOR) 20 MG tablet; Take 1 tablet (20 mg total) by mouth every evening.  3. Essential hypertension Controlled. Continue current regime.  - losartan (COZAAR) 100 MG tablet; Take 1 tablet (100 mg total) by mouth daily.  Other orders - sildenafil (REVATIO) 20 MG tablet; Take 3 tabs by mouth once a day as needed. - blood glucose meter kit and supplies; Per insurance preference. Test cbgs three times a day. Dx E11.65, Z79.4  FOLLOW-UP: 3 months   The above assessment and management plan was discussed with the patient. The patient verbalized understanding of and has agreed to the management plan. Patient is aware to call the clinic if symptoms persist or worsen. Patient is aware when to return to the clinic for a follow-up visit. Patient educated on when it is appropriate to go to the emergency department.    I provided 20 minutes of non-face-to-face time during this encounter.  Rutherford Guys, MD Primary Care at [place], [place] [address] Ph.  [phone] Fax [phone]

## 2018-09-10 MED FILL — CONTOUR NEXT METER: W/DEVICE | 30 days supply | Qty: 1 | Fill #0

## 2018-09-10 MED FILL — MICROLET LANCETS MISC: 30 days supply | Qty: 100 | Fill #0

## 2018-09-10 MED FILL — CONTOUR NEXT STRIPS: 30 days supply | Qty: 100 | Fill #0

## 2018-09-16 ENCOUNTER — Ambulatory Visit: Payer: BLUE CROSS/BLUE SHIELD

## 2018-09-26 MED FILL — UNIFINE PENTIPS 32GX5/32: 32G X 4 MM | 30 days supply | Qty: 100 | Fill #0

## 2018-09-26 MED FILL — LANTUS SOLOSTAR 100 UNITS/M: 100 | 28 days supply | Qty: 3 | Fill #1

## 2018-09-26 MED FILL — UNIFINE PENTIPS 32GX5/32": 32G X 4 MM | 30 days supply | Qty: 100 | Fill #0

## 2018-10-24 ENCOUNTER — Telehealth: Payer: Self-pay | Admitting: Family Medicine

## 2018-10-24 MED FILL — SIMVASTATIN 20 MG TABLET: 20 | 90 days supply | Qty: 90 | Fill #0

## 2018-10-24 MED FILL — metFORMIN HCL 1000 MG TABS: 1000 | 90 days supply | Qty: 180 | Fill #0

## 2018-10-24 NOTE — Telephone Encounter (Signed)
Copied from [place] [id number]. Topic: Quick Communication - Rx Refill/Question >> Oct 24, 2018 11:05 AM Nathanial Millman J wrote: Medication: metFORMIN (GLUCOPHAGE) 1000 MG tablet and simvastatin (ZOCOR) 20 MG tablet Has the patient contacted their pharmacy? Yes.   (Agent: If no, request that the patient contact the pharmacy for the refill.) (Agent: If yes, when and what did the pharmacy advise?)  Preferred Pharmacy (with phone number or street name): [facility]   Agent: Please be advised that RX refills may take up to 3 business days. We ask that you follow-up with your pharmacy.

## 2018-11-16 MED FILL — LOSARTAN POTASSIUM 100 MG T: 100 | 30 days supply | Qty: 30 | Fill #0

## 2018-12-16 MED FILL — LOSARTAN POTASSIUM 100 MG T: 100 | 30 days supply | Qty: 30 | Fill #1

## 2018-12-16 MED FILL — JANUVIA 100 MG TABLET: 100 | 90 days supply | Qty: 90 | Fill #0

## 2018-12-19 ENCOUNTER — Ambulatory Visit: Payer: BLUE CROSS/BLUE SHIELD | Admitting: Family Medicine

## 2018-12-19 ENCOUNTER — Other Ambulatory Visit: Payer: Self-pay

## 2019-01-19 MED FILL — SIMVASTATIN 20 MG TABLET: 20 | 90 days supply | Qty: 90 | Fill #1

## 2019-01-19 MED FILL — LOSARTAN POTASSIUM 100 MG T: 100 | 30 days supply | Qty: 30 | Fill #2

## 2019-01-31 MED FILL — metFORMIN HCL 1000 MG TABS: 1000 | 90 days supply | Qty: 180 | Fill #1

## 2019-02-17 MED FILL — LOSARTAN POTASSIUM 100 MG T: 100 | 30 days supply | Qty: 30 | Fill #3

## 2019-03-16 MED FILL — SILDENAFIL CITRATE 20 MG TA: 20 | 10 days supply | Qty: 30 | Fill #0

## 2019-03-17 MED FILL — JANUVIA 100 MG TABLET: 100 | 90 days supply | Qty: 90 | Fill #1

## 2019-03-17 MED FILL — LOSARTAN POTASSIUM 100 MG T: 100 | 30 days supply | Qty: 30 | Fill #4

## 2019-04-24 MED FILL — SIMVASTATIN 20 MG TABLET: 20 | 90 days supply | Qty: 90 | Fill #0

## 2019-04-24 MED FILL — LOSARTAN POTASSIUM 100 MG T: 100 | 90 days supply | Qty: 90 | Fill #1

## 2019-05-26 ENCOUNTER — Ambulatory Visit (INDEPENDENT_AMBULATORY_CARE_PROVIDER_SITE_OTHER): Payer: 59 | Admitting: Family Medicine

## 2019-05-26 ENCOUNTER — Other Ambulatory Visit: Payer: Self-pay

## 2019-05-26 ENCOUNTER — Telehealth: Payer: Self-pay | Admitting: Family Medicine

## 2019-05-26 ENCOUNTER — Telehealth: Payer: Self-pay

## 2019-05-26 ENCOUNTER — Encounter: Payer: Self-pay | Admitting: Family Medicine

## 2019-05-26 VITALS — BP 128/83 | HR 83 | Temp 97.9°F | Ht 67.0 in | Wt 176.0 lb

## 2019-05-26 DIAGNOSIS — I10 Essential (primary) hypertension: Secondary | ICD-10-CM

## 2019-05-26 DIAGNOSIS — E785 Hyperlipidemia, unspecified: Secondary | ICD-10-CM | POA: Diagnosis not present

## 2019-05-26 DIAGNOSIS — M65341 Trigger finger, right ring finger: Secondary | ICD-10-CM

## 2019-05-26 DIAGNOSIS — E1165 Type 2 diabetes mellitus with hyperglycemia: Secondary | ICD-10-CM

## 2019-05-26 DIAGNOSIS — Z794 Long term (current) use of insulin: Secondary | ICD-10-CM

## 2019-05-26 DIAGNOSIS — Z125 Encounter for screening for malignant neoplasm of prostate: Secondary | ICD-10-CM | POA: Diagnosis not present

## 2019-05-26 LAB — POCT GLYCOSYLATED HEMOGLOBIN (HGB A1C): Hemoglobin A1C: 13.8 % — AB (ref 4.0–5.6)

## 2019-05-26 MED ORDER — LOSARTAN POTASSIUM 100 MG PO TABS: 100.0000 mg | ORAL_TABLET | Freq: Every day | ORAL | 1 refills | Status: DC

## 2019-05-26 MED ORDER — TRULICITY 0.75 MG/0.5ML ~~LOC~~ SOAJ: 0.7500 mg | SUBCUTANEOUS | 3 refills | Status: DC

## 2019-05-26 MED ORDER — FARXIGA 10 MG PO TABS: 10.0000 mg | ORAL_TABLET | Freq: Every day | ORAL | 3 refills | Status: DC

## 2019-05-26 MED ORDER — SILDENAFIL CITRATE 20 MG PO TABS: ORAL_TABLET | ORAL | 11 refills | Status: DC

## 2019-05-26 MED ORDER — JARDIANCE 25 MG PO TABS: 25.0000 mg | ORAL_TABLET | Freq: Every day | ORAL | 3 refills | Status: DC

## 2019-05-26 MED ORDER — METFORMIN HCL 1000 MG PO TABS: 1000.0000 mg | ORAL_TABLET | Freq: Two times a day (BID) | ORAL | 1 refills | Status: DC

## 2019-05-26 MED ORDER — SIMVASTATIN 20 MG PO TABS: 20.0000 mg | ORAL_TABLET | Freq: Every evening | ORAL | 1 refills | Status: DC

## 2019-05-26 MED FILL — TRULICITY 0.75 MG/0.5 ML PE: 0.75 | 28 days supply | Qty: 2 | Fill #0

## 2019-05-26 MED FILL — JARDIANCE 25 MG TABLET: 25 | 30 days supply | Qty: 30 | Fill #0

## 2019-05-26 MED FILL — metFORMIN HCL 1000 MG TABS: 1000 | 90 days supply | Qty: 180 | Fill #0

## 2019-05-26 NOTE — Patient Instructions (Signed)
° ° ° °  If you have lab work done today you will be contacted with your lab results within the next 2 weeks.  If you have not heard from us then please contact us. The fastest way to get your results is to register for My Chart. ° ° °IF you received an x-ray today, you will receive an invoice from [facility]. Please contact [facility] at [phone] with questions or concerns regarding your invoice.  ° °IF you received labwork today, you will receive an invoice from [facility]. Please contact [facility] at [phone] with questions or concerns regarding your invoice.  ° °Our billing staff will not be able to assist you with questions regarding bills from these companies. ° °You will be contacted with the lab results as soon as they are available. The fastest way to get your results is to activate your My Chart account. Instructions are located on the last page of this paperwork. If you have not heard from us regarding the results in 2 weeks, please contact this office. °  ° ° ° °

## 2019-05-26 NOTE — Telephone Encounter (Signed)
Pt medication was changed from farxiga to jardiance per pcp. Insurance sis nit cover farxiga. Pt was also told to go on to website to print up coupons

## 2019-05-26 NOTE — Addendum Note (Signed)
Addended by: Myles Lipps on: 05/26/2019 05:44 PM   Modules accepted: Orders

## 2019-05-26 NOTE — Telephone Encounter (Signed)
PA request processed for Farxiga 10mg , Key [id number]

## 2019-05-26 NOTE — Progress Notes (Addendum)
1/15/202111:32 AM  Olene Craven 03-22-1956, 64 y.o., male [id number]  Chief Complaint  Patient presents with  . Follow-up    chronic cond, having trouble with right ring finger will not bend, pops in and out when he tries to bend.    HPI:   Patient is a 64 y.o. male with past medical history significant for DM2, HTN, HLP who presents today for routine followup  Last OV May 2020 - telemedicine Titration of lantus to fasting goal  Overall he is feeling well He reports that cbgs are up and down Checks cbgs once a week, fasting, 250s Metformin, [place] He is not doing lantus anymore due to constipation  Continues to take simvastatin and losartan He stopped going to the gym when covid started  He does try to follow a low carb diet He has not been feeling cbgs are too high Denies any polydipsia or polyuria  Right ring finger getting stuck in flex position  Lab Results  Component Value Date   HGBA1C 13.1 (A) 06/15/2018   HGBA1C 7.2 (H) 09/21/2017   HGBA1C 11.9 (H) 04/23/2017   Lab Results  Component Value Date   MICROALBUR 0.4 08/21/2015   LDLCALC 69 06/15/2018   CREATININE 0.91 06/15/2018    Depression screen PHQ 2/9 05/26/2019 09/09/2018 06/15/2018  Decreased Interest 0 0 0  Down, Depressed, Hopeless 0 0 0  PHQ - 2 Score 0 0 0    Fall Risk  05/26/2019 09/09/2018 10/27/2017 09/21/2017 04/23/2017  Falls in the past year? 0 0 No No No  Number falls in past yr: 0 0 - - -  Injury with Fall? 0 0 - - -     No Known Allergies  Prior to Admission medications   Medication Sig Start Date End Date Taking? Authorizing Provider  blood glucose meter kit and supplies Per insurance preference. Test cbgs three times a day. Dx E11.65, Z79.4 09/09/18  Yes Rutherford Guys, MD  Insulin Glargine (LANTUS SOLOSTAR) 100 UNIT/ML Solostar Pen Inject 30 Units into the skin daily. 09/09/18  Yes Rutherford Guys, MD  Insulin Pen Needle (PEN NEEDLES) 32G X 4 MM MISC 1 application by Does not apply  route daily. 06/15/18  Yes Wendie Agreste, MD  losartan (COZAAR) 100 MG tablet Take 1 tablet (100 mg total) by mouth daily. 09/09/18  Yes Rutherford Guys, MD  metFORMIN (GLUCOPHAGE) 1000 MG tablet Take 1 tablet (1,000 mg total) by mouth 2 (two) times daily with a meal. 09/09/18  Yes Rutherford Guys, MD  sildenafil (REVATIO) 20 MG tablet Take 3 tabs by mouth once a day as needed. 09/09/18  Yes Rutherford Guys, MD  simvastatin (ZOCOR) 20 MG tablet Take 1 tablet (20 mg total) by mouth every evening. 09/09/18  Yes Rutherford Guys, MD  sitaGLIPtin (JANUVIA) 100 MG tablet Take 1 tablet (100 mg total) by mouth daily. 09/09/18  Yes Rutherford Guys, MD    Past Medical History:  Diagnosis Date  . Diabetes mellitus without complication ([place])   . Hyperlipidemia   . Hypertension     Past Surgical History:  Procedure Laterality Date  . CIRCUMCISION      Social History   Tobacco Use  . Smoking status: Never Smoker  . Smokeless tobacco: Never Used  Substance Use Topics  . Alcohol use: No    Alcohol/week: 0.0 standard drinks    Comment: former alcohol use/ not currently    Family History  Problem Relation Age of  Onset  . Diabetes Mother   . Hyperlipidemia Mother   . Hypertension Mother   . Diabetes Father   . Cancer Sister 24       uterus?  . Diabetes Sister   . Hypertension Brother   . Diabetes Sister   . Diabetes Sister   . Diabetes Sister   . Diabetes Brother   . Hyperlipidemia Brother   . Hypertension Brother   . Colon cancer Neg Hx     Review of Systems  Constitutional: Negative for chills and fever.  Respiratory: Negative for cough and shortness of breath.   Cardiovascular: Negative for chest pain, palpitations and leg swelling.  Gastrointestinal: Negative for abdominal pain, nausea and vomiting.   Per hpi  OBJECTIVE:  Today's Vitals   05/26/19 1120  BP: 128/83  Pulse: 83  Temp: 97.9 F (36.6 C)  SpO2: 97%  Weight: 176 lb (79.8 kg)  Height: 5' 7"  (1.702 m)    Body mass index is 27.57 kg/m.   Physical Exam Vitals and nursing note reviewed.  Constitutional:      Appearance: He is well-developed.  HENT:     Head: Normocephalic and atraumatic.  Eyes:     Conjunctiva/sclera: Conjunctivae normal.     Pupils: Pupils are equal, round, and reactive to light.  Cardiovascular:     Rate and Rhythm: Normal rate and regular rhythm.     Heart sounds: No murmur. No friction rub. No gallop.   Pulmonary:     Effort: Pulmonary effort is normal.     Breath sounds: Normal breath sounds. No wheezing or rales.  Musculoskeletal:     Cervical back: Neck supple.  Skin:    General: Skin is warm and dry.  Neurological:     Mental Status: He is alert and oriented to person, place, and time.     No results found for this or any previous visit (from the past 24 hour(s)).  No results found.   ASSESSMENT and PLAN  1. Type 2 diabetes mellitus with hyperglycemia, with long-term current use of insulin (HCC) Not controlled. Does not want to do insulin. Discussed new med r/se/b. Discussed LFM. Discussed cbgs monitoring - TSH - Microalbumin / creatinine urine ratio - Lipid panel - POCT glycosylated hemoglobin (Hb A1C) - metFORMIN (GLUCOPHAGE) 1000 MG tablet; Take 1 tablet (1,000 mg total) by mouth 2 (two) times daily with a meal.  2. Hyperlipidemia LDL goal <70 Checking labs today, medications will be adjusted as needed.  - TSH - Lipid panel - POCT glycosylated hemoglobin (Hb A1C) - simvastatin (ZOCOR) 20 MG tablet; Take 1 tablet (20 mg total) by mouth every evening.  3. Essential hypertension Controlled. Continue current regime.  - TSH - Lipid panel - POCT glycosylated hemoglobin (Hb A1C) - losartan (COZAAR) 100 MG tablet; Take 1 tablet (100 mg total) by mouth daily.  4. Screening for prostate cancer - PSA  5. Trigger ring finger of right hand - Ambulatory referral to Orthopedic Surgery  Other orders - Dulaglutide (TRULICITY) 3.71 GG/2.6RS  SOPN; Inject 0.75 mg into the skin once a week. - jardiance 81m 1 tab PO daily - sildenafil (REVATIO) 20 MG tablet; Take 3 tabs by mouth once a day as needed.  Return in about 3 months (around 08/24/2019).    IRutherford Guys MD [facility] [place] [address].  [phone] [phone]

## 2019-05-27 LAB — MICROALBUMIN / CREATININE URINE RATIO
Creatinine, Urine: 85.1 mg/dL
Microalb/Creat Ratio: 19 mg/g creat (ref 0–29)
Microalbumin, Urine: 15.9 ug/mL

## 2019-05-27 LAB — LIPID PANEL
Chol/HDL Ratio: 2.9 ratio (ref 0.0–5.0)
Cholesterol, Total: 128 mg/dL (ref 100–199)
HDL: 44 mg/dL (ref >39)
LDL Chol Calc (NIH): 68 mg/dL (ref 0–99)
Triglycerides: 78 mg/dL (ref 0–149)
VLDL Cholesterol Cal: 16 mg/dL (ref 5–40)

## 2019-05-27 LAB — PSA: Prostate Specific Ag, Serum: 1.1 ng/mL (ref 0.0–4.0)

## 2019-05-27 LAB — TSH: TSH: 1.58 u[IU]/mL (ref 0.450–4.500)

## 2019-06-09 ENCOUNTER — Encounter: Payer: Self-pay | Admitting: Radiology

## 2019-06-14 ENCOUNTER — Ambulatory Visit: Payer: BLUE CROSS/BLUE SHIELD | Admitting: Orthopaedic Surgery

## 2019-06-27 ENCOUNTER — Other Ambulatory Visit: Payer: Self-pay | Admitting: Family Medicine

## 2019-06-27 DIAGNOSIS — Z794 Long term (current) use of insulin: Secondary | ICD-10-CM

## 2019-06-27 DIAGNOSIS — E1165 Type 2 diabetes mellitus with hyperglycemia: Secondary | ICD-10-CM

## 2019-06-27 NOTE — Telephone Encounter (Signed)
Requested medication (s) are due for refill today: No  Requested medication (s) are on the active medication list: No  Last refill:  06/15/18  Future visit scheduled: No  Notes to clinic:  Prescription is not on medication list and has expired.    Requested Prescriptions  Pending Prescriptions Disp Refills   JANUVIA 100 MG tablet [Pharmacy Med Name: JANUVIA 100 MG TABLET 100 Tablet] 90 tablet 1    Sig: TAKE 1 TABLET (100 MG TOTAL) BY MOUTH DAILY.      Endocrinology:  Diabetes - DPP-4 Inhibitors Failed - 06/27/2019  7:41 AM      Failed - HBA1C is between 0 and 7.9 and within 180 days    Hemoglobin A1C  Date Value Ref Range Status  05/26/2019 13.8 (A) 4.0 - 5.6 % Final   Hgb A1c MFr Bld  Date Value Ref Range Status  09/21/2017 7.2 (H) 4.8 - 5.6 % Final    Comment:             Prediabetes: 5.7 - 6.4          Diabetes: >6.4          Glycemic control for adults with diabetes: <7.0           Failed - Cr in normal range and within 360 days    Creat  Date Value Ref Range Status  08/21/2015 0.88 0.70 - 1.33 mg/dL Final   Creatinine, Ser  Date Value Ref Range Status  06/15/2018 0.91 0.76 - 1.27 mg/dL Final          Passed - Valid encounter within last 6 months    Recent Outpatient Visits           1 month ago Type 2 diabetes mellitus with hyperglycemia, with long-term current use of insulin (HCC)   [facility], Meda Coffee, MD   9 months ago Type 2 diabetes mellitus with hyperglycemia, with long-term current use of insulin [facility])   [facility], Meda Coffee, MD   1 year ago Type 2 diabetes mellitus with hyperglycemia, with long-term current use of insulin [facility])   [facility], Asencion Partridge, MD   1 year ago Acute left-sided low back pain, with sciatica presence unspecified   [facility], Meda Coffee, MD   1 year ago Annual physical exam   Primary [facility] at [facility], [place], [place]

## 2019-07-03 MED FILL — JANUVIA 100 MG TABLET: 100 | 90 days supply | Qty: 90 | Fill #0

## 2019-07-03 NOTE — Telephone Encounter (Signed)
Pt called in regard to med refill. Called in last week and has not heard back. Please advise

## 2019-07-28 MED FILL — LOSARTAN POTASSIUM 100 MG T: 100 | 90 days supply | Qty: 90 | Fill #0

## 2019-08-01 MED FILL — SIMVASTATIN 20 MG TABLET: 20 | 90 days supply | Qty: 90 | Fill #0

## 2019-08-14 ENCOUNTER — Ambulatory Visit (INDEPENDENT_AMBULATORY_CARE_PROVIDER_SITE_OTHER): Payer: 59 | Admitting: Family Medicine

## 2019-08-14 ENCOUNTER — Other Ambulatory Visit: Payer: Self-pay

## 2019-08-14 ENCOUNTER — Encounter: Payer: Self-pay | Admitting: Family Medicine

## 2019-08-14 VITALS — BP 140/73 | HR 89 | Temp 98.3°F | Ht 67.0 in | Wt 176.0 lb

## 2019-08-14 DIAGNOSIS — E1165 Type 2 diabetes mellitus with hyperglycemia: Secondary | ICD-10-CM

## 2019-08-14 DIAGNOSIS — Z794 Long term (current) use of insulin: Secondary | ICD-10-CM | POA: Diagnosis not present

## 2019-08-14 DIAGNOSIS — Z0001 Encounter for general adult medical examination with abnormal findings: Secondary | ICD-10-CM

## 2019-08-14 DIAGNOSIS — Z0000 Encounter for general adult medical examination without abnormal findings: Secondary | ICD-10-CM

## 2019-08-14 MED ORDER — SITAGLIPTIN PHOSPHATE 100 MG PO TABS: ORAL_TABLET | ORAL | 1 refills | Status: DC

## 2019-08-14 MED ORDER — CANAGLIFLOZIN 300 MG PO TABS: 300.0000 mg | ORAL_TABLET | Freq: Every day | ORAL | 1 refills | Status: DC

## 2019-08-14 NOTE — Patient Instructions (Signed)
° ° ° °  If you have lab work done today you will be contacted with your lab results within the next 2 weeks.  If you have not heard from us then please contact us. The fastest way to get your results is to register for My Chart. ° ° °IF you received an x-ray today, you will receive an invoice from [facility]. Please contact [facility] at [phone] with questions or concerns regarding your invoice.  ° °IF you received labwork today, you will receive an invoice from [facility]. Please contact [facility] at [phone] with questions or concerns regarding your invoice.  ° °Our billing staff will not be able to assist you with questions regarding bills from these companies. ° °You will be contacted with the lab results as soon as they are available. The fastest way to get your results is to activate your My Chart account. Instructions are located on the last page of this paperwork. If you have not heard from us regarding the results in 2 weeks, please contact this office. °  ° ° ° °

## 2019-08-14 NOTE — Progress Notes (Signed)
4/5/20213:12 PM  Ricardo Mills 1956/01/11, 64 y.o., male [id number]  Chief Complaint  Patient presents with  . Annual Exam    HPI:   Patient is a 64 y.o. 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  Colorectal Cancer Screening: due 2025, colonoscopy Prostate Cancer Screening: done jan 2021 HIV Screening: done HCV Screening: done Seasonal Influenza Vaccination: UTD Td/Tdap Vaccination: UTD Pneumococcal Vaccination: due at age 18 Zoster Vaccination: declines Frequency of Dental evaluation: has dentures Frequency of Eye evaluation: had eye exam end of 2020, [facility] [address] Patient reports negative TB screen    Depression screen [facility] 2/9 08/14/2019 05/26/2019 09/09/2018  Decreased Interest 0 0 0  Down, Depressed, Hopeless 0 0 0  PHQ - 2 Score 0 0 0    Fall Risk  08/14/2019 05/26/2019 09/09/2018 10/27/2017 09/21/2017  Falls in the past year? 0 0 0 No No  Number falls in past yr: 0 0 0 - -  Injury with Fall? 0 0 0 - -     No Known Allergies  Prior to Admission medications   Medication Sig Start Date End Date Taking? Authorizing Provider  blood glucose meter kit and supplies Per insurance preference. Test cbgs three times a day. Dx E11.65, Z79.4 09/09/18  Yes Rutherford Guys, MD  Dulaglutide (TRULICITY) 9.17 HX/5.0VW SOPN Inject 0.75 mg into the skin once a week. 05/26/19  Yes Rutherford Guys, MD  empagliflozin (JARDIANCE) 25 MG TABS tablet Take 25 mg by mouth daily. 05/26/19  Yes Rutherford Guys, MD  Insulin Pen Needle (PEN NEEDLES) 32G X 4 MM MISC 1 application by Does not apply route daily. 06/15/18  Yes Wendie Agreste, MD  JANUVIA 100 MG tablet TAKE 1 TABLET (100 MG TOTAL) BY MOUTH DAILY. 07/03/19  Yes Wendie Agreste, MD  losartan (COZAAR) 100 MG tablet Take 1 tablet (100 mg total) by mouth daily. 05/26/19  Yes Rutherford Guys, MD  metFORMIN (GLUCOPHAGE) 1000 MG tablet Take 1 tablet (1,000 mg total)  by mouth 2 (two) times daily with a meal. 05/26/19  Yes Rutherford Guys, MD  sildenafil (REVATIO) 20 MG tablet Take 3 tabs by mouth once a day as needed. 05/26/19  Yes Rutherford Guys, MD  simvastatin (ZOCOR) 20 MG tablet Take 1 tablet (20 mg total) by mouth every evening. 05/26/19  Yes Rutherford Guys, MD    Past Medical History:  Diagnosis Date  . Diabetes mellitus without complication ([place])   . Hyperlipidemia   . Hypertension     Past Surgical History:  Procedure Laterality Date  . CIRCUMCISION      Social History   Tobacco Use  . Smoking status: Never Smoker  . Smokeless tobacco: Never Used  Substance Use Topics  . Alcohol use: No    Alcohol/week: 0.0 standard drinks    Comment: former alcohol use/ not currently    Family History  Problem Relation Age of Onset  . Diabetes Mother   . Hyperlipidemia Mother   . Hypertension Mother   . Diabetes Father   . Cancer Sister 24       uterus?  . Diabetes Sister   . Hypertension Brother   . Diabetes Sister   . Diabetes Sister   . Diabetes Sister   . Diabetes Brother   . Hyperlipidemia Brother   . Hypertension Brother   . Colon cancer Neg Hx  Review of Systems  Constitutional: Negative for chills and fever.  Respiratory: Negative for cough and shortness of breath.   Cardiovascular: Negative for chest pain, palpitations and leg swelling.  Gastrointestinal: Negative for abdominal pain, nausea and vomiting.  Genitourinary: Negative for dysuria, frequency and urgency.  Musculoskeletal: Negative for joint pain and myalgias.  Neurological: Negative for dizziness, tingling and headaches.  Endo/Heme/Allergies: Negative for polydipsia.  Psychiatric/Behavioral: Negative for depression. The patient is not nervous/anxious and does not have insomnia.   All other systems reviewed and are negative. per hpi   OBJECTIVE:  Today's Vitals   08/14/19 1453  BP: 140/73  Pulse: 89  Temp: 98.3 F (36.8 C)  SpO2: 100%  Weight:  176 lb (79.8 kg)  Height: 5' 7"  (1.702 m)   Body mass index is 27.57 kg/m.   Physical Exam Vitals and nursing note reviewed.  Constitutional:      Appearance: He is well-developed.  HENT:     Head: Normocephalic and atraumatic.     Right Ear: Hearing, tympanic membrane, ear canal and external ear normal.     Left Ear: Hearing, tympanic membrane, ear canal and external ear normal.     Mouth/Throat:     Pharynx: No oropharyngeal exudate.  Eyes:     Conjunctiva/sclera: Conjunctivae normal.     Pupils: Pupils are equal, round, and reactive to light.  Neck:     Thyroid: No thyromegaly.  Cardiovascular:     Rate and Rhythm: Normal rate and regular rhythm.     Heart sounds: Normal heart sounds. No murmur. No friction rub. No gallop.   Pulmonary:     Effort: Pulmonary effort is normal.     Breath sounds: Normal breath sounds. No wheezing, rhonchi or rales.  Abdominal:     General: Bowel sounds are normal. There is no distension.     Palpations: Abdomen is soft. There is no mass.     Tenderness: There is no abdominal tenderness.  Musculoskeletal:        General: Normal range of motion.     Cervical back: Neck supple.     Right lower leg: No edema.     Left lower leg: No edema.  Lymphadenopathy:     Cervical: No cervical adenopathy.  Skin:    General: Skin is warm and dry.  Neurological:     Mental Status: He is alert and oriented to person, place, and time.     Cranial Nerves: No cranial nerve deficit.     Coordination: Coordination normal.     Gait: Gait normal.     Deep Tendon Reflexes: Reflexes are normal and symmetric.  Psychiatric:        Mood and Affect: Mood normal.        Behavior: Behavior normal.      Diabetic Foot Form - Detailed   Diabetic Foot Exam - detailed Can the patient see the bottom of their feet?: No Are the shoes appropriate in style and fit?: No Is there swelling or and abnormal foot shape?: No Is there a claw toe deformity?: No Is there  elevated skin temparature?: No Is there foot or ankle muscle weakness?: Yes Normal Range of Motion: No Semmes-Weinstein Monofilament Test R Site 1-Great Toe: Neg L Site 1-Great Toe: Neg        No results found for this or any previous visit (from the past 24 hour(s)).  No results found.   ASSESSMENT and PLAN  1. Annual physical exam HCM reviewed/discussed. Anticipatory guidance  regarding healthy weight, lifestyle and choices given. Form completed.  2. Type 2 diabetes mellitus with hyperglycemia, with long-term current use of insulin (HCC) - HM DIABETES FOOT EXAM - sitaGLIPtin (JANUVIA) 100 MG tablet; TAKE 1 TABLET (100 MG TOTAL) BY MOUTH DAILY.  Other orders - canagliflozin (INVOKANA) 300 MG TABS tablet; Take 1 tablet (300 mg total) by mouth daily before breakfast.  Changed from jardiance to invokana due to insurance coverage  Return in about 4 weeks (around 09/11/2019). - DM    Aryaman Haliburton Reubin Milan, MD Primary Care at [facility], [place] [address] Ph.  [phone] Fax [phone]

## 2019-08-17 MED FILL — metFORMIN HCL 1000 MG TABS: 1000 | 90 days supply | Qty: 180 | Fill #1

## 2019-09-11 ENCOUNTER — Ambulatory Visit: Payer: 59 | Admitting: Family Medicine

## 2019-10-01 ENCOUNTER — Other Ambulatory Visit: Payer: Self-pay

## 2019-10-01 ENCOUNTER — Encounter (HOSPITAL_COMMUNITY): Payer: Self-pay | Admitting: Emergency Medicine

## 2019-10-01 ENCOUNTER — Ambulatory Visit (HOSPITAL_COMMUNITY)
Admission: EM | Admit: 2019-10-01 | Discharge: 2019-10-01 | Disposition: A | Discharge status: Home / Self Care | Payer: 59 | Attending: Urgent Care | Admitting: Urgent Care

## 2019-10-01 DIAGNOSIS — Z20822 Contact with and (suspected) exposure to covid-19: Secondary | ICD-10-CM | POA: Diagnosis present

## 2019-10-01 DIAGNOSIS — R030 Elevated blood-pressure reading, without diagnosis of hypertension: Secondary | ICD-10-CM | POA: Diagnosis present

## 2019-10-01 DIAGNOSIS — R519 Headache, unspecified: Secondary | ICD-10-CM | POA: Insufficient documentation

## 2019-10-01 DIAGNOSIS — I10 Essential (primary) hypertension: Secondary | ICD-10-CM | POA: Diagnosis present

## 2019-10-01 LAB — SARS CORONAVIRUS 2 (TAT 6-24 HRS): SARS Coronavirus 2: NEGATIVE

## 2019-10-01 NOTE — ED Triage Notes (Signed)
PT was exposed to COVID at work. He has had both vaccines. He had a headache Thursday.

## 2019-10-01 NOTE — Discharge Instructions (Addendum)
Please just use Tylenol at a dose of 500mg -650mg  once every 6 hours as needed for your aches, pains, fevers. Do not use any nonsteroidal anti-inflammatories (NSAIDs) like ibuprofen, Motrin, naproxen, Aleve, etc. which are all available over-the-counter.    For diabetes or elevated blood sugar, please make sure you are avoiding starchy, carbohydrate foods like pasta, breads, pastry, rice, potatoes, desserts. These foods can elevated your blood sugar. Also, avoid sodas, sweet teas, sugary beverages, fruit juices.  Drinking plain water will be much more helpful, try 64 ounces of water daily.  It is okay to flavor your water naturally by cutting cucumber or lemon and mint or lime, placing it in a picture with water and drinking it over a period of 2 to 3 days as long as it remains refrigerated.    For elevated blood pressure, make sure you are monitoring salt in your diet.  Do not eat restaurant foods and limit processed foods at home, prepare/cook your own foods at home.  Processed foods include things like frozen meals preseasoned meats and dinners, deli meats, canned foods as they are high in sodium/salt.  Make sure your pain attention to sodium labels on foods you by at the grocery store.  For seasoning you can use a brand called Mrs. Dash which includes a lot of salt free seasonings.  Salads - kale, spinach, cabbage, spring mix; use seeds like pumpkin seeds or sunflower seeds, almonds, walnuts or pecans; you can also use 1-2 hard boiled eggs in your salads Fruits - avocadoes, berries (blueberries, raspberries, blackberries), apples, oranges Vegetables - aspargus, cauliflower, broccoli, green beans, brussel spouts, bell peppers; stay away from starchy vegetables like potatoes, carrots, peas  Regarding meat it is better to eat lean meats and limit your red meat consumption including pork.  Wild caught fish, chicken breast are good options.  DO NOT EAT ANY FOODS ON THIS LIST THAT YOU ARE ALLERGIC TO.

## 2019-10-01 NOTE — ED Provider Notes (Signed)
[facility]   MRN: [id number] DOB: 08/20/1955  Subjective:   Ricardo Mills is a 64 y.o. male presenting for Covid exposure at work.  Patient has had both vaccinations.  Denies any active symptoms but did have a headache on Thursday.  He does have a history of high blood pressure and diabetes.  Admits dietary noncompliance.  He does take his medications as prescribed however.  No current facility-administered medications for this encounter.  Current Outpatient Medications:  .  blood glucose meter kit and supplies, Per insurance preference. Test cbgs three times a day. Dx E11.65, Z79.4, Disp: 1 each, Rfl: 11 .  canagliflozin (INVOKANA) 300 MG TABS tablet, Take 1 tablet (300 mg total) by mouth daily before breakfast., Disp: 90 tablet, Rfl: 1 .  Dulaglutide (TRULICITY) 1.49 FW/2.6VZ SOPN, Inject 0.75 mg into the skin once a week., Disp: 4 pen, Rfl: 3 .  Insulin Pen Needle (PEN NEEDLES) 32G X 4 MM MISC, 1 application by Does not apply route daily., Disp: 100 each, Rfl: 3 .  losartan (COZAAR) 100 MG tablet, Take 1 tablet (100 mg total) by mouth daily., Disp: 90 tablet, Rfl: 1 .  metFORMIN (GLUCOPHAGE) 1000 MG tablet, Take 1 tablet (1,000 mg total) by mouth 2 (two) times daily with a meal., Disp: 180 tablet, Rfl: 1 .  sildenafil (REVATIO) 20 MG tablet, Take 3 tabs by mouth once a day as needed., Disp: 30 tablet, Rfl: 11 .  simvastatin (ZOCOR) 20 MG tablet, Take 1 tablet (20 mg total) by mouth every evening., Disp: 90 tablet, Rfl: 1 .  sitaGLIPtin (JANUVIA) 100 MG tablet, TAKE 1 TABLET (100 MG TOTAL) BY MOUTH DAILY., Disp: 90 tablet, Rfl: 1   No Known Allergies  Past Medical History:  Diagnosis Date  . Diabetes mellitus without complication ([place])   . Hyperlipidemia   . Hypertension      Past Surgical History:  Procedure Laterality Date  . CIRCUMCISION      Family History  Problem Relation Age of Onset  . Diabetes Mother   . Hyperlipidemia Mother   . Hypertension Mother   .  Diabetes Father   . Cancer Sister 38       uterus?  . Diabetes Sister   . Hypertension Brother   . Diabetes Sister   . Diabetes Sister   . Diabetes Sister   . Diabetes Brother   . Hyperlipidemia Brother   . Hypertension Brother   . Colon cancer Neg Hx     Social History   Tobacco Use  . Smoking status: Never Smoker  . Smokeless tobacco: Never Used  Substance Use Topics  . Alcohol use: No    Alcohol/week: 0.0 standard drinks    Comment: former alcohol use/ not currently  . Drug use: No    Review of Systems  Constitutional: Negative for fever and malaise/fatigue.  HENT: Negative for congestion, ear pain, sinus pain and sore throat.   Eyes: Negative for blurred vision, double vision, discharge and redness.  Respiratory: Negative for cough, hemoptysis, shortness of breath and wheezing.   Cardiovascular: Negative for chest pain.  Gastrointestinal: Negative for abdominal pain, diarrhea, nausea and vomiting.  Genitourinary: Negative for dysuria, flank pain and hematuria.  Musculoskeletal: Negative for myalgias.  Skin: Negative for rash.  Neurological: Positive for headaches. Negative for dizziness, sensory change, speech change, focal weakness and weakness.  Psychiatric/Behavioral: Negative for depression and substance abuse.     Objective:   Vitals: BP (!) 162/106   Pulse 76  Temp 98.6 F (37 C) (Oral)   Resp 16   SpO2 98%   BP Readings from Last 3 Encounters:  10/01/19 (!) 162/106  08/14/19 140/73  05/26/19 128/83   Bp recheck was 156/79 on recheck.   Physical Exam Constitutional:      General: He is not in acute distress.    Appearance: Normal appearance. He is well-developed. He is not ill-appearing, toxic-appearing or diaphoretic.  HENT:     Head: Normocephalic and atraumatic.     Right Ear: External ear normal.     Left Ear: External ear normal.     Nose: Nose normal.     Mouth/Throat:     Mouth: Mucous membranes are moist.     Pharynx: Oropharynx  is clear.  Eyes:     General: No scleral icterus.    Extraocular Movements: Extraocular movements intact.     Pupils: Pupils are equal, round, and reactive to light.  Cardiovascular:     Rate and Rhythm: Normal rate and regular rhythm.     Heart sounds: Normal heart sounds. No murmur. No friction rub. No gallop.   Pulmonary:     Effort: Pulmonary effort is normal. No respiratory distress.     Breath sounds: Normal breath sounds. No stridor. No wheezing, rhonchi or rales.  Musculoskeletal:     Right lower leg: No edema.     Left lower leg: No edema.  Skin:    General: Skin is warm and dry.  Neurological:     Mental Status: He is alert and oriented to person, place, and time.     Cranial Nerves: No cranial nerve deficit.     Motor: No weakness.     Coordination: Coordination normal.     Gait: Gait normal.     Deep Tendon Reflexes: Reflexes normal.     Comments: Negative Romberg and pronator drift.   Psychiatric:        Mood and Affect: Mood normal.        Behavior: Behavior normal.        Thought Content: Thought content normal.        Judgment: Judgment normal.     Assessment and Plan :   PDMP not reviewed this encounter.  1. Generalized headache   2. Essential hypertension   3. Elevated blood pressure reading   4. Close exposure to COVID-19 virus     No alarming signs suggestive of ACS, stroke.  Blood pressure recheck was more reassuring.  Counseled patient on need for dietary modifications, maintain regimen prescribed by PCP for his chronic conditions.  Use Tylenol for any headaches, strict ER precautions.  COVID-19 testing is pending, low suspicion for this given lack of symptoms and Covid vaccination.  Recommended patient sign up for MyChart to access his results. Counseled patient on potential for adverse effects with medications prescribed/recommended today, ER and return-to-clinic precautions discussed, patient verbalized understanding.    Jaynee Eagles, [place]  10/01/19 1712

## 2019-10-02 ENCOUNTER — Ambulatory Visit: Payer: 59 | Admitting: Family Medicine

## 2019-10-03 ENCOUNTER — Encounter: Payer: Self-pay | Admitting: Family Medicine

## 2019-10-05 MED FILL — JANUVIA 100 MG TABLET: 100 | 90 days supply | Qty: 90 | Fill #1

## 2019-11-02 MED FILL — LOSARTAN POTASSIUM 100 MG T: 100 | 90 days supply | Qty: 90 | Fill #1

## 2019-11-02 MED FILL — SIMVASTATIN 20 MG TABLET: 20 | 90 days supply | Qty: 90 | Fill #1

## 2019-11-27 ENCOUNTER — Other Ambulatory Visit: Payer: Self-pay | Admitting: Family Medicine

## 2019-11-27 DIAGNOSIS — Z794 Long term (current) use of insulin: Secondary | ICD-10-CM

## 2019-11-27 MED FILL — metFORMIN HCL 1000 MG TABS: 1000 | 90 days supply | Qty: 180 | Fill #0

## 2019-12-02 IMAGING — US US SCROTUM W/ DOPPLER COMPLETE
1 series · 14 of 25 positions shown · non-contrast
Comparison: None.

CLINICAL DATA: Initial evaluation for left-sided groin pain.

EXAM:
SCROTAL ULTRASOUND
DOPPLER ULTRASOUND OF THE TESTICLES
TECHNIQUE: Complete ultrasound examination of the testicles, epididymis, and
other scrotal structures was performed. Color and spectral Doppler
ultrasound were also utilized to evaluate blood flow to the
testicles.

[Series 1: us scrotum w/ doppler complete · 0.05mm/px · 14 of 59 slices shown]
[im 1/59]
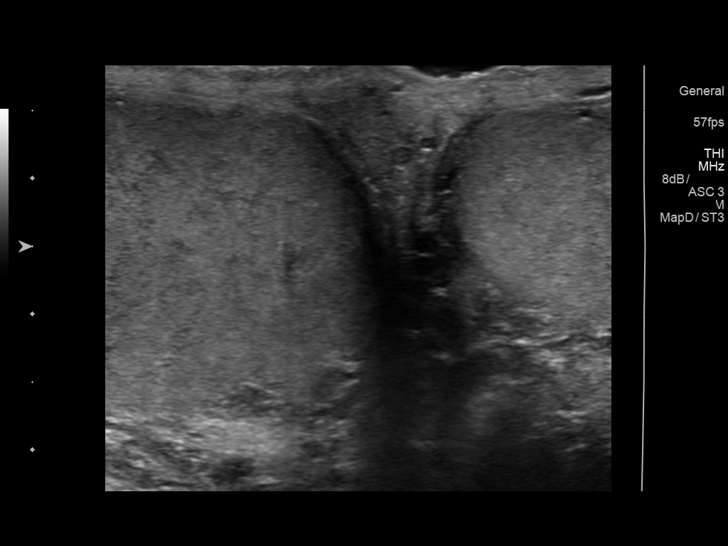
[im 5/59]
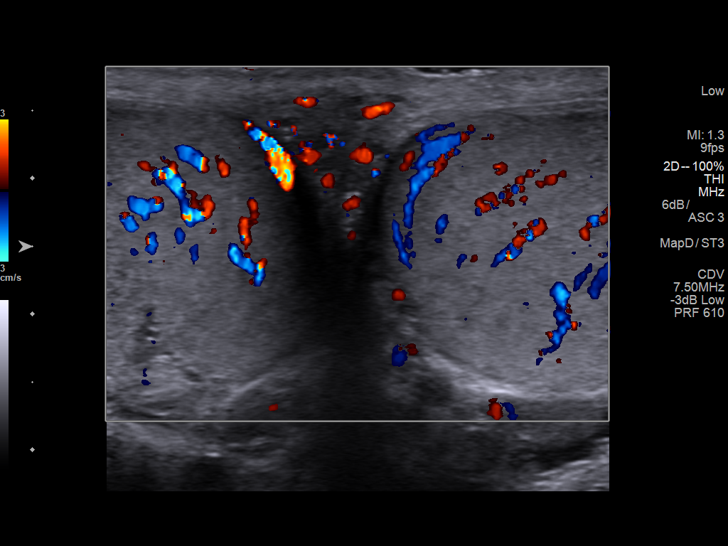
[im 10/59]
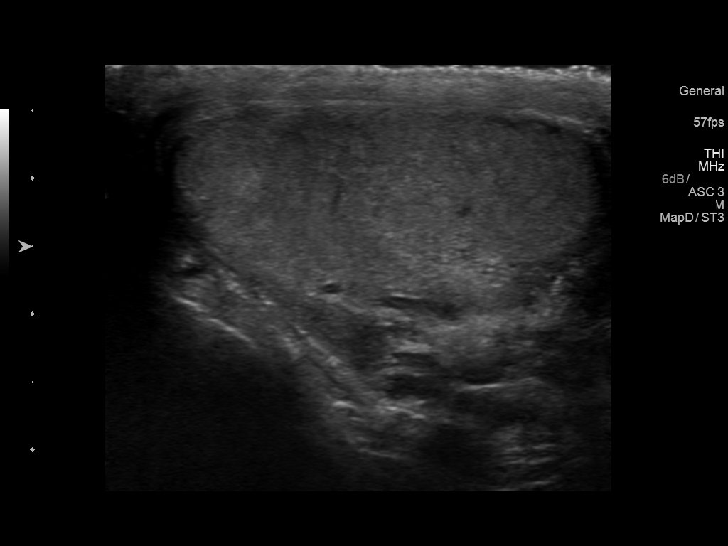
[im 15/59]
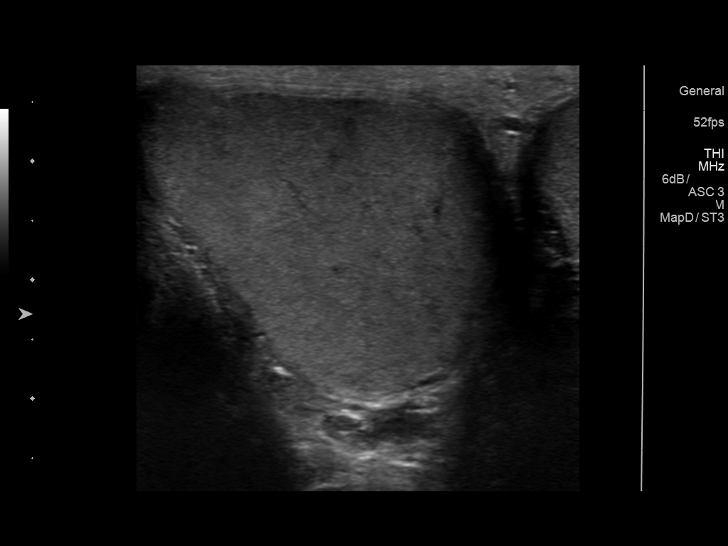
[im 20/59]
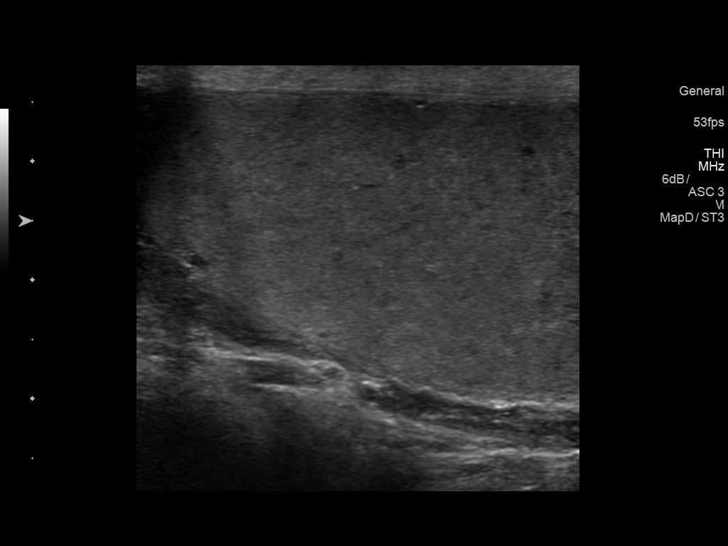
[im 22/59]
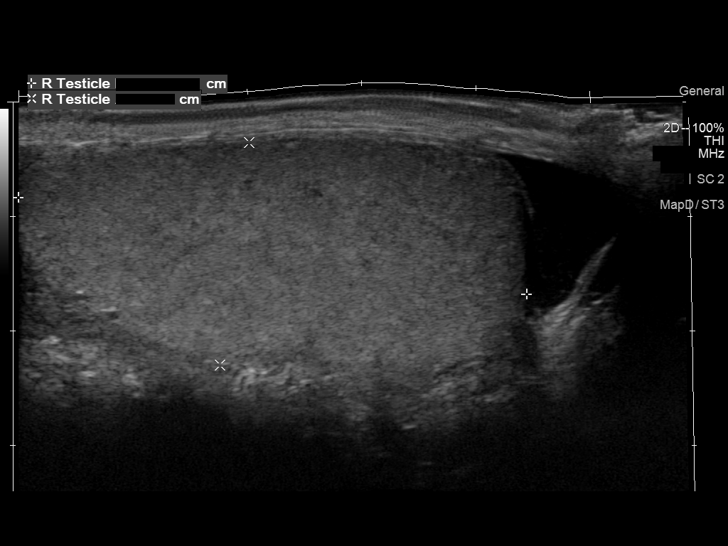
[im 27/59]
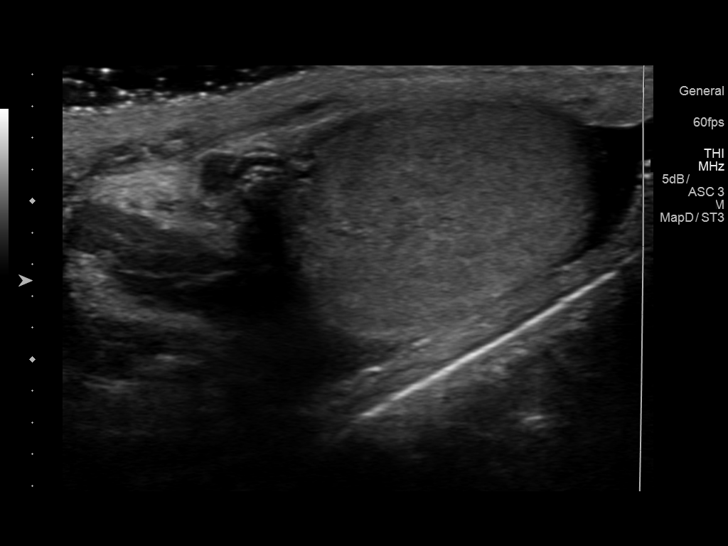
[im 32/59]
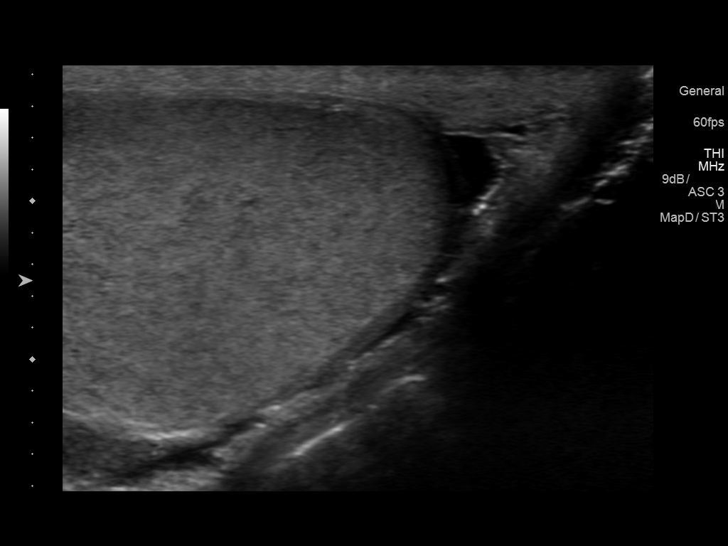
[im 37/59]
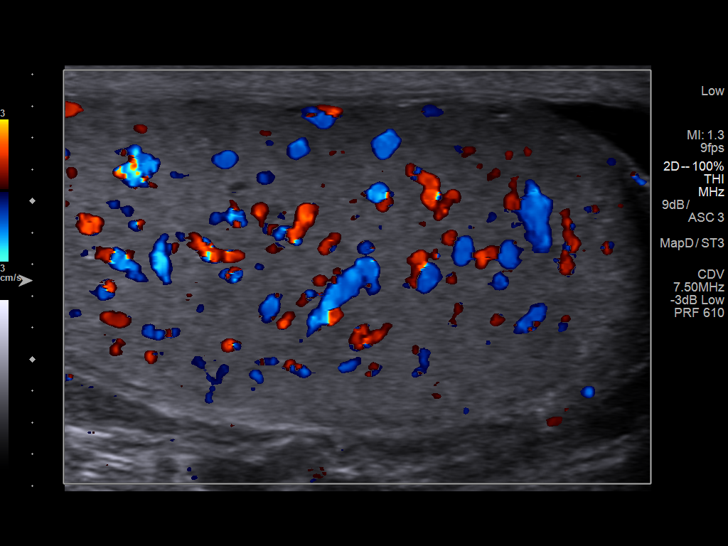
[im 39/59]
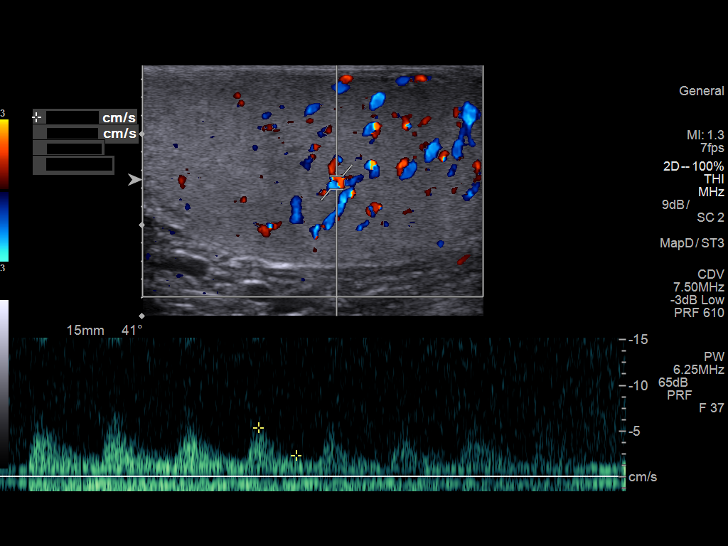
[im 44/59]
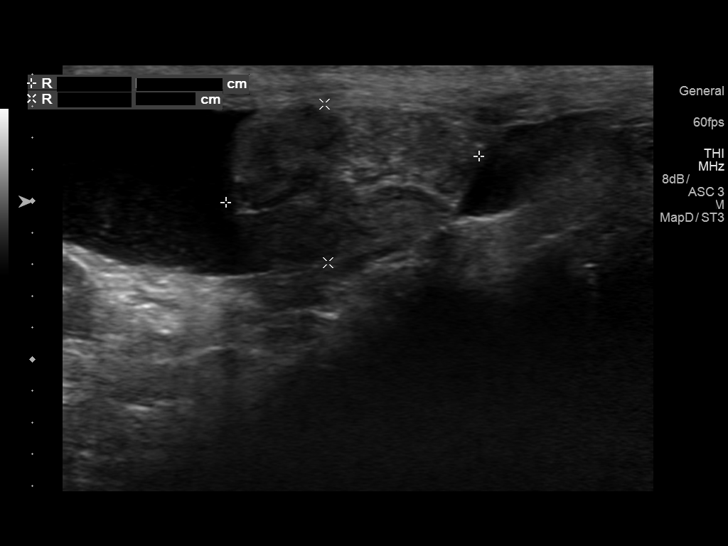
[im 49/59]
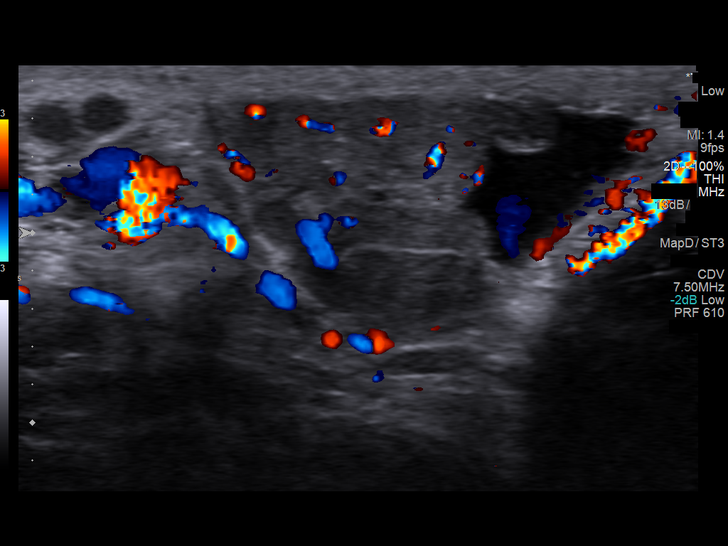
[im 54/59]
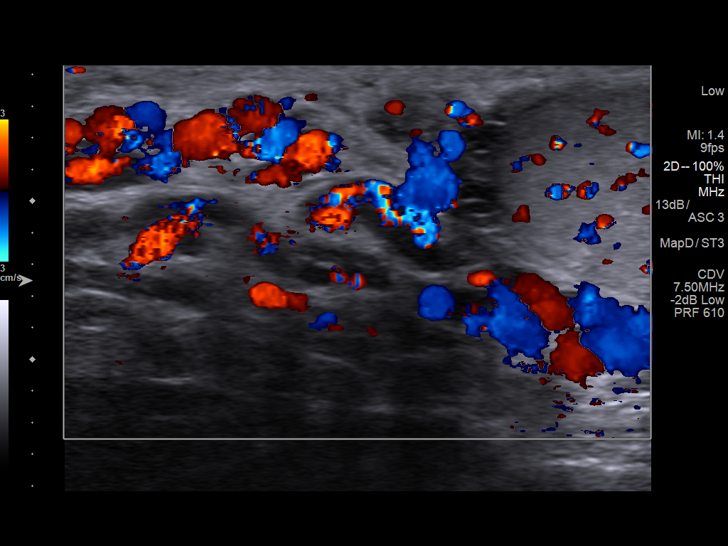
[im 59/59]
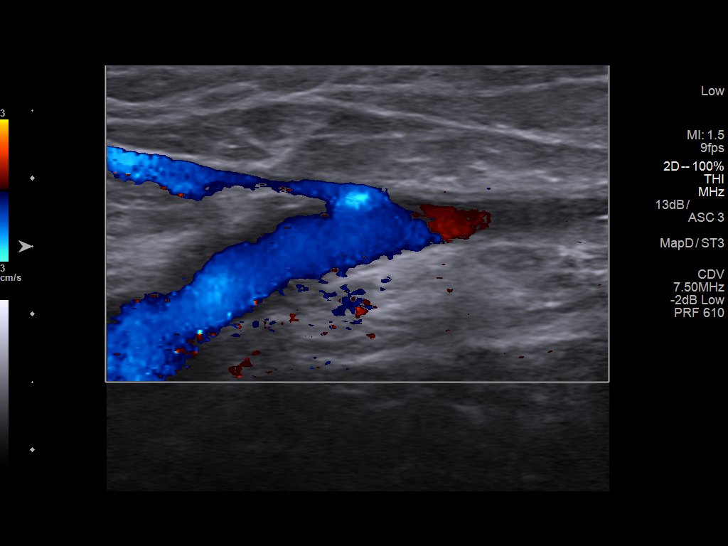

[14 of 25 positions shown; findings below may reference images not displayed]

FINDINGS: Right testicle

Measurements: 4.5 x 2.0 x 2.9 cm. No mass or microlithiasis
visualized.

Left testicle

Measurements: 5.0 x 2.2 x 3.1 cm. No mass or microlithiasis
visualized.

Right epididymis:  Normal in size and appearance.

Left epididymis:  Normal in size and appearance.

Hydrocele:  Small bilateral hydroceles.

Varicocele:  Left-sided varicocele.

Pulsed Doppler interrogation of both testes demonstrates normal low
resistance arterial and venous waveforms bilaterally.
IMPRESSION: 1. Left-sided varicocele, which could contribute to left-sided groin
pain.
2. Small bilateral hydroceles.
3. Otherwise normal scrotal ultrasound.

## 2019-12-11 ENCOUNTER — Other Ambulatory Visit: Payer: Self-pay

## 2019-12-11 ENCOUNTER — Ambulatory Visit (INDEPENDENT_AMBULATORY_CARE_PROVIDER_SITE_OTHER): Payer: 59 | Admitting: Registered Nurse

## 2019-12-11 ENCOUNTER — Other Ambulatory Visit: Payer: Self-pay | Admitting: Registered Nurse

## 2019-12-11 ENCOUNTER — Encounter: Payer: Self-pay | Admitting: Registered Nurse

## 2019-12-11 VITALS — BP 153/88 | HR 80 | Temp 98.0°F | Resp 18 | Ht 67.0 in | Wt 177.2 lb

## 2019-12-11 DIAGNOSIS — E785 Hyperlipidemia, unspecified: Secondary | ICD-10-CM | POA: Diagnosis not present

## 2019-12-11 DIAGNOSIS — E1165 Type 2 diabetes mellitus with hyperglycemia: Secondary | ICD-10-CM | POA: Diagnosis not present

## 2019-12-11 DIAGNOSIS — Z794 Long term (current) use of insulin: Secondary | ICD-10-CM

## 2019-12-11 DIAGNOSIS — I10 Essential (primary) hypertension: Secondary | ICD-10-CM

## 2019-12-11 LAB — POCT GLYCOSYLATED HEMOGLOBIN (HGB A1C)

## 2019-12-11 MED ORDER — SITAGLIPTIN PHOSPHATE 100 MG PO TABS: ORAL_TABLET | ORAL | 1 refills | Status: DC

## 2019-12-11 MED ORDER — DULAGLUTIDE 1.5 MG/0.5ML ~~LOC~~ SOAJ: 1.5000 mg | SUBCUTANEOUS | 0 refills | Status: DC

## 2019-12-11 MED ORDER — SIMVASTATIN 20 MG PO TABS: 20.0000 mg | ORAL_TABLET | Freq: Every evening | ORAL | 1 refills | Status: DC

## 2019-12-11 MED ORDER — CANAGLIFLOZIN 300 MG PO TABS: 300.0000 mg | ORAL_TABLET | Freq: Every day | ORAL | 1 refills | Status: DC

## 2019-12-11 MED ORDER — METFORMIN HCL 1000 MG PO TABS: 1000.0000 mg | ORAL_TABLET | Freq: Two times a day (BID) | ORAL | 0 refills | Status: DC

## 2019-12-11 MED ORDER — LOSARTAN POTASSIUM 100 MG PO TABS: 100.0000 mg | ORAL_TABLET | Freq: Every day | ORAL | 1 refills | Status: DC

## 2019-12-11 NOTE — Progress Notes (Signed)
Established Patient Office Visit  Subjective:  Patient ID: Ricardo Mills, male    DOB: 10-Oct-1955  Age: 64 y.o. MRN: [id number]  CC:  Chief Complaint  Patient presents with   Medication Refill    patient states that he would like to get a medication refill. Per patient he has no questions or concerns.    HPI Ricardo Mills presents for medication refills. Pt of Dr. Pamella Pert. Last seen April 2021 for CPE.  Hx of:  HLD: last drawn Jan 2021 - wnl. Taking simvastatin 102m PO qd with dinner, good effect, no AEs.  HTN: Had been taking losartan 1073mPO qd. Has been out for a few days - BP elevated at today's visit. Hoping to restart losartan 10074mO qd and return in 2 weeks for nurse visit bp check T2DM: Had been taking invokana 300263ZCrulicity 0.75.88FOekly, and sitagliptin 100m31mily. Poor compliance. Has run out for some time. Last a1c in May 26 2019 was at 13.8.  No other concerns or complaints. Tolerating all current meds well.   Past Medical History:  Diagnosis Date   Diabetes mellitus without complication (HCC)[place] Hyperlipidemia    Hypertension     Past Surgical History:  Procedure Laterality Date   CIRCUMCISION      Family History  Problem Relation Age of Onset   Diabetes Mother    Hyperlipidemia Mother    Hypertension Mother    Diabetes Father    Cancer Sister 67  32   uterus?   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   Regular exercise: occasionally   Caffeine use: coffee daily   Social Determinants of Health   Financial Resource Strain:    Difficulty of Paying Living Expenses:   Food Insecurity:    Worried About RunnCharity fundraiserthe Last Year:    Ran Arboriculturistthe Last Year:   Transportation Needs:    LackFilm/video editordical):    Lack of Transportation (Non-Medical):   Physical Activity:    Days of Exercise per Week:    Minutes of Exercise per Session:   Stress:    Feeling of Stress :   Social Connections:    Frequency of Communication with Friends and Family:    Frequency of Social Gatherings with Friends and Family:    Attends Religious Services:  Active Member of Clubs or Organizations:    Attends Archivist Meetings:    Marital Status:   Intimate Partner Violence:    Fear of Current or Ex-Partner:    Emotionally Abused:    Physically Abused:    Sexually Abused:     Outpatient Medications Prior to Visit  Medication Sig Dispense Refill   blood glucose meter kit and supplies Per insurance preference. Test cbgs three times a day. Dx E11.65, Z79.4 1 each 11   Insulin Pen Needle (PEN NEEDLES) 32G X 4 MM MISC 1 application by Does not apply route daily. 100 each 3   sildenafil (REVATIO) 20 MG tablet Take 3 tabs by mouth once a day as needed. 30 tablet 11   canagliflozin (INVOKANA) 300 MG TABS tablet Take 1 tablet (300 mg total) by mouth daily before breakfast. 90 tablet 1   Dulaglutide (TRULICITY) 2.75 TZ/0.0FV SOPN Inject 0.75 mg into the skin once a week.  4 pen 3   losartan (COZAAR) 100 MG tablet Take 1 tablet (100 mg total) by mouth daily. 90 tablet 1   metFORMIN (GLUCOPHAGE) 1000 MG tablet TAKE 1 TABLET (1,000 MG TOTAL) BY MOUTH 2 (TWO) TIMES DAILY WITH A MEAL. 180 tablet 0   simvastatin (ZOCOR) 20 MG tablet Take 1 tablet (20 mg total) by mouth every evening. 90 tablet 1   sitaGLIPtin (JANUVIA) 100 MG tablet TAKE 1 TABLET (100 MG TOTAL) BY MOUTH DAILY. 90 tablet 1   No facility-administered medications prior to visit.    No Known Allergies  ROS Review of Systems  Constitutional: Negative.   HENT: Negative.   Eyes: Negative.   Respiratory: Negative.   Cardiovascular: Negative.   Gastrointestinal: Negative.   Endocrine: Negative.   Genitourinary: Negative.   Musculoskeletal: Negative.   Skin: Negative.   Allergic/Immunologic: Negative.   Neurological: Negative.   Hematological: Negative.   Psychiatric/Behavioral: Negative.   All other systems reviewed and are negative.     Objective:    Physical Exam Vitals and nursing note reviewed.  Constitutional:      General: He is not in acute distress.    Appearance: Normal appearance. He is normal weight. He is not ill-appearing, toxic-appearing or diaphoretic.  Cardiovascular:     Rate and Rhythm: Normal rate and regular rhythm.     Heart sounds: Normal heart sounds.  Pulmonary:     Effort: Pulmonary effort is normal. No respiratory distress.     Breath sounds: Normal breath sounds.  Skin:    General: Skin is warm and dry.     Capillary Refill: Capillary refill takes less than 2 seconds.     Coloration: Skin is not jaundiced or pale.     Findings: No bruising, erythema, lesion or rash.  Neurological:     General: No focal deficit present.     Mental Status: He is alert and oriented to person, place, and time. Mental status is at baseline.  Psychiatric:        Mood and Affect: Mood normal.        Behavior: Behavior normal.        Thought Content: Thought content  normal.        Judgment: Judgment normal.     BP (!) 153/88    Pulse 80    Temp 98 F (36.7 C) (Temporal)    Resp 18    Ht _0  (1.702 m)    Wt 177 lb 3.2 oz (80.4 kg)    SpO2  96%    BMI 27.75 kg/m  Wt Readings from Last 3 Encounters:  12/11/19 177 lb 3.2 oz (80.4 kg)  08/14/19 176 lb (79.8 kg)  05/26/19 176 lb (79.8 kg)     Health Maintenance Due  Topic Date Due   HEMOGLOBIN A1C  11/23/2019   INFLUENZA VACCINE  12/10/2019    There are no preventive care reminders to display for this patient.  Lab Results  Component Value Date   TSH 1.580 05/26/2019   Lab Results  Component Value Date   WBC 4.6 09/21/2017   HGB 13.9 09/21/2017   HCT 39.9 09/21/2017   MCV 75 (L) 09/21/2017   PLT 167 09/21/2017   Lab Results  Component Value Date   NA 139 06/15/2018   K 4.6 06/15/2018   CO2 20 06/15/2018   GLUCOSE 267 (H) 06/15/2018   BUN 10 06/15/2018   CREATININE 0.91 06/15/2018   BILITOT 0.3 06/15/2018   ALKPHOS 91 06/15/2018   AST 22 06/15/2018   ALT 36 06/15/2018   PROT 6.9 06/15/2018   ALBUMIN 4.5 06/15/2018   CALCIUM 9.8 06/15/2018   Lab Results  Component Value Date   CHOL 128 05/26/2019   Lab Results  Component Value Date   HDL 44 05/26/2019   Lab Results  Component Value Date   LDLCALC 68 05/26/2019   Lab Results  Component Value Date   TRIG 78 05/26/2019   Lab Results  Component Value Date   CHOLHDL 2.9 05/26/2019   Lab Results  Component Value Date   HGBA1C 13.8 (A) 05/26/2019      Assessment & Plan:   Problem List Items Addressed This Visit      Cardiovascular and Mediastinum   HTN (hypertension) (Chronic)   Relevant Medications   losartan (COZAAR) 100 MG tablet   simvastatin (ZOCOR) 20 MG tablet     Endocrine   Diabetes (HCC) - Primary (Chronic)   Relevant Medications   canagliflozin (INVOKANA) 300 MG TABS tablet   losartan (COZAAR) 100 MG tablet   metFORMIN (GLUCOPHAGE) 1000 MG tablet   simvastatin (ZOCOR) 20 MG tablet    sitaGLIPtin (JANUVIA) 100 MG tablet   Dulaglutide 1.5 MG/0.5ML SOPN   Other Relevant Orders   POCT glycosylated hemoglobin (Hb A1C)     Other   Hyperlipidemia LDL goal <70 (Chronic)   Relevant Medications   losartan (COZAAR) 100 MG tablet   simvastatin (ZOCOR) 20 MG tablet      Meds ordered this encounter  Medications   canagliflozin (INVOKANA) 300 MG TABS tablet    Sig: Take 1 tablet (300 mg total) by mouth daily before breakfast.    Dispense:  90 tablet    Refill:  1    Order Specific Question:   Supervising Provider    Answer:   Carlota Raspberry, JEFFREY R [[id number]]   losartan (COZAAR) 100 MG tablet    Sig: Take 1 tablet (100 mg total) by mouth daily.    Dispense:  90 tablet    Refill:  1    Order Specific Question:   Supervising Provider    Answer:   Carlota Raspberry, JEFFREY R [[id number]]   metFORMIN (GLUCOPHAGE) 1000 MG tablet    Sig: Take 1 tablet (1,000 mg total) by mouth 2 (two) times daily with a meal.    Dispense:  180 tablet    Refill:  0    Order Specific Question:   Supervising Provider    Answer:   Carlota Raspberry, JEFFREY R [[id number]]   simvastatin (  ZOCOR) 20 MG tablet    Sig: Take 1 tablet (20 mg total) by mouth every evening.    Dispense:  90 tablet    Refill:  1    Order Specific Question:   Supervising Provider    Answer:   Carlota Raspberry, JEFFREY R [[id number]]   sitaGLIPtin (JANUVIA) 100 MG tablet    Sig: TAKE 1 TABLET (100 MG TOTAL) BY MOUTH DAILY.    Dispense:  90 tablet    Refill:  1    Order Specific Question:   Supervising Provider    Answer:   Carlota Raspberry, JEFFREY R [[id number]]   Dulaglutide 1.5 MG/0.5ML SOPN    Sig: Inject 0.5 mLs (1.5 mg total) into the skin once a week.    Dispense:  7 mL    Refill:  0    Order Specific Question:   Supervising Provider    Answer:   Carlota Raspberry, JEFFREY R [[id number]]    Follow-up: Return in about 3 months (around 03/12/2020) for t2dm follow up with provider.   PLAN  a1c today at 13.0 - somewhat improved from last check  Pt has recently started going to the gym  again  Will increase trulicity to 3.1YH/8.8IL once weekly  Return in 3 mo   Patient encouraged to call clinic with any questions, comments, or concerns.  Maximiano Coss, NP

## 2019-12-11 NOTE — Patient Instructions (Signed)
° ° ° °  If you have lab work done today you will be contacted with your lab results within the next 2 weeks.  If you have not heard from us then please contact us. The fastest way to get your results is to register for My Chart. ° ° °IF you received an x-ray today, you will receive an invoice from [facility]. Please contact [facility] at [phone] with questions or concerns regarding your invoice.  ° °IF you received labwork today, you will receive an invoice from [facility]. Please contact [facility] at [phone] with questions or concerns regarding your invoice.  ° °Our billing staff will not be able to assist you with questions regarding bills from these companies. ° °You will be contacted with the lab results as soon as they are available. The fastest way to get your results is to activate your My Chart account. Instructions are located on the last page of this paperwork. If you have not heard from us regarding the results in 2 weeks, please contact this office. °  ° ° ° °

## 2019-12-18 MED FILL — JANUVIA 100 MG TABLET: 100 | 90 days supply | Qty: 90 | Fill #0

## 2019-12-27 MED FILL — JANUVIA 100 MG TABLET: 100 | 90 days supply | Qty: 90 | Fill #0

## 2020-01-29 ENCOUNTER — Telehealth (INDEPENDENT_AMBULATORY_CARE_PROVIDER_SITE_OTHER): Payer: 59 | Admitting: Family Medicine

## 2020-01-29 ENCOUNTER — Other Ambulatory Visit: Payer: Self-pay | Admitting: Family Medicine

## 2020-01-29 ENCOUNTER — Other Ambulatory Visit: Payer: Self-pay

## 2020-01-29 ENCOUNTER — Encounter: Payer: Self-pay | Admitting: Family Medicine

## 2020-01-29 DIAGNOSIS — R0683 Snoring: Secondary | ICD-10-CM | POA: Diagnosis not present

## 2020-01-29 DIAGNOSIS — I10 Essential (primary) hypertension: Secondary | ICD-10-CM | POA: Diagnosis not present

## 2020-01-29 DIAGNOSIS — R519 Headache, unspecified: Secondary | ICD-10-CM | POA: Diagnosis not present

## 2020-01-29 DIAGNOSIS — G8929 Other chronic pain: Secondary | ICD-10-CM | POA: Diagnosis not present

## 2020-01-29 MED ORDER — AMLODIPINE BESYLATE 5 MG PO TABS
5.0000 mg | ORAL_TABLET | Freq: Every day | ORAL | 1 refills | Status: DC
Start: 2020-01-29 — End: 2020-04-23

## 2020-01-29 MED FILL — SIMVASTATIN 20 MG TABLET: 20 | 90 days supply | Qty: 90 | Fill #0

## 2020-01-29 MED FILL — AMLODIPINE BESYLATE 5 MG TA: 5 | 90 days supply | Qty: 90 | Fill #0

## 2020-01-29 MED FILL — LOSARTAN POTASSIUM 100 MG T: 100 | 90 days supply | Qty: 90 | Fill #0

## 2020-01-29 NOTE — Progress Notes (Signed)
 Virtual Visit Note  I connected with patient on 01/29/20 at 132pm by video doximity and verified that I am speaking with the correct person using two identifiers. Ricardo Mills is currently located at home and patient and wife is currently with them during visit. The provider, Irma M Santiago, MD is located in their office at time of visit.  I discussed the limitations, risks, security and privacy concerns of performing an evaluation and management service by telephone and the availability of in person appointments. I also discussed with the patient that there may be a patient responsible charge related to this service. The patient expressed understanding and agreed to proceed.   I provided 18 minutes of non-face-to-face time during this encounter.  Chief Complaint  Patient presents with  . Migraine    Has been having headaches for the past 2 month on and off. Taking tylenol for the pain    HPI He has been having intermittent bilateral headaches at night, sometimes wakes up with them, sometimes wake up because of them x 6 months Takes APAP which resolves HA quickly He denies any headaches during the day Radiates towards neck but no frank neck pain Denies any vision changes, hearing changes, speech changes, nausea, vomiting Denies any headaches during the day Checks BP occ, ~ 156/86, couple of days ago He does snore and wife reports apnea He is not taking truliicty, he is not checking cbgs He is taking losartan 100mg daily in AM  Seen in ED in May 2021 for headaches  BP Readings from Last 3 Encounters:  12/11/19 (!) 153/88  10/01/19 (!) 162/106  08/14/19 140/73     No Known Allergies  Prior to Admission medications   Medication Sig Start Date End Date Taking? Authorizing Provider  blood glucose meter kit and supplies Per insurance preference. Test cbgs three times a day. Dx E11.65, Z79.4 09/09/18  Yes Santiago, Irma M, MD  canagliflozin (INVOKANA) 300 MG TABS tablet Take 1  tablet (300 mg total) by mouth daily before breakfast. 12/11/19  Yes Morrow, Richard, NP  Dulaglutide 1.5 MG/0.5ML SOPN Inject 0.5 mLs (1.5 mg total) into the skin once a week. 12/11/19  Yes Morrow, Richard, NP  Insulin Pen Needle (PEN NEEDLES) 32G X 4 MM MISC 1 application by Does not apply route daily. 06/15/18  Yes Greene, Jeffrey R, MD  losartan (COZAAR) 100 MG tablet Take 1 tablet (100 mg total) by mouth daily. 12/11/19  Yes Morrow, Richard, NP  metFORMIN (GLUCOPHAGE) 1000 MG tablet Take 1 tablet (1,000 mg total) by mouth 2 (two) times daily with a meal. 12/11/19  Yes Morrow, Richard, NP  sildenafil (REVATIO) 20 MG tablet Take 3 tabs by mouth once a day as needed. 05/26/19  Yes Santiago, Irma M, MD  simvastatin (ZOCOR) 20 MG tablet Take 1 tablet (20 mg total) by mouth every evening. 12/11/19  Yes Morrow, Richard, NP  sitaGLIPtin (JANUVIA) 100 MG tablet TAKE 1 TABLET (100 MG TOTAL) BY MOUTH DAILY. 12/11/19  Yes Morrow, Richard, NP    Past Medical History:  Diagnosis Date  . Diabetes mellitus without complication (HCC)   . Hyperlipidemia   . Hypertension     Past Surgical History:  Procedure Laterality Date  . CIRCUMCISION      Social History   Tobacco Use  . Smoking status: Never Smoker  . Smokeless tobacco: Never Used  Substance Use Topics  . Alcohol use: No    Alcohol/week: 0.0 standard drinks    Comment: former   alcohol use/ not currently    Family History  Problem Relation Age of Onset  . Diabetes Mother   . Hyperlipidemia Mother   . Hypertension Mother   . Diabetes Father   . Cancer Sister 67       uterus?  . Diabetes Sister   . Hypertension Brother   . Diabetes Sister   . Diabetes Sister   . Diabetes Sister   . Diabetes Brother   . Hyperlipidemia Brother   . Hypertension Brother   . Colon cancer Neg Hx     ROS Per hpi  Objective  Vitals as reported by the patient: none  GEN: AAOx3, NAD HEENT: [place]/AT, pupils are symmetrical, EOMI, non-icteric sclera Resp: breathing  comfortably, speaking in full sentences Skin: no rashes noted, no pallor Psych: good eye contact, normal mood and affect  ASSESSMENT and PLAN  1. Chronic nonintractable headache, unspecified headache type Discussed limited eval, per history will improve BP control (adding amlodipine at bedtime) and eval for OSA. Cont APAP prn. Consider imagining. ER precautions reviewed.  2. Essential hypertension See #1  3. Snoring See #2  Other orders - amLODipine (NORVASC) 5 MG tablet; Take 1 tablet (5 mg total) by mouth at bedtime.  FOLLOW-UP: 4 weeks with Morrow   The above assessment and management plan was discussed with the patient. The patient verbalized understanding of and has agreed to the management plan. Patient is aware to call the clinic if symptoms persist or worsen. Patient is aware when to return to the clinic for a follow-up visit. Patient educated on when it is appropriate to go to the emergency department.     Irma M Santiago, MD [facility] [address] [place], [place] [address] Ph.  [phone] Fax [phone]   

## 2020-01-29 NOTE — Patient Instructions (Signed)
° ° ° °  If you have lab work done today you will be contacted with your lab results within the next 2 weeks.  If you have not heard from us then please contact us. The fastest way to get your results is to register for My Chart. ° ° °IF you received an x-ray today, you will receive an invoice from [facility]. Please contact [facility] at [phone] with questions or concerns regarding your invoice.  ° °IF you received labwork today, you will receive an invoice from [facility]. Please contact [facility] at [phone] with questions or concerns regarding your invoice.  ° °Our billing staff will not be able to assist you with questions regarding bills from these companies. ° °You will be contacted with the lab results as soon as they are available. The fastest way to get your results is to activate your My Chart account. Instructions are located on the last page of this paperwork. If you have not heard from us regarding the results in 2 weeks, please contact this office. °  ° ° ° °

## 2020-02-12 ENCOUNTER — Encounter: Payer: Self-pay | Admitting: Family Medicine

## 2020-02-12 ENCOUNTER — Ambulatory Visit (INDEPENDENT_AMBULATORY_CARE_PROVIDER_SITE_OTHER): Payer: 59 | Admitting: Family Medicine

## 2020-02-12 ENCOUNTER — Other Ambulatory Visit: Payer: Self-pay

## 2020-02-12 ENCOUNTER — Other Ambulatory Visit: Payer: Self-pay | Admitting: Family Medicine

## 2020-02-12 VITALS — BP 122/81 | HR 86 | Temp 98.0°F | Resp 18 | Ht 66.0 in | Wt 171.6 lb

## 2020-02-12 DIAGNOSIS — I10 Essential (primary) hypertension: Secondary | ICD-10-CM | POA: Diagnosis not present

## 2020-02-12 DIAGNOSIS — E785 Hyperlipidemia, unspecified: Secondary | ICD-10-CM

## 2020-02-12 DIAGNOSIS — E1165 Type 2 diabetes mellitus with hyperglycemia: Secondary | ICD-10-CM

## 2020-02-12 DIAGNOSIS — E1169 Type 2 diabetes mellitus with other specified complication: Secondary | ICD-10-CM

## 2020-02-12 LAB — POCT GLYCOSYLATED HEMOGLOBIN (HGB A1C): Hemoglobin A1C: 12.7 % — AB (ref 4.0–5.6)

## 2020-02-12 MED ORDER — RYBELSUS 3 MG PO TABS: 3.0000 mg | ORAL_TABLET | Freq: Every day | ORAL | 0 refills | Status: DC

## 2020-02-12 MED ORDER — RYBELSUS 7 MG PO TABS: 7.0000 mg | ORAL_TABLET | Freq: Every day | ORAL | 3 refills | Status: DC

## 2020-02-12 MED FILL — RYBELSUS 3 MG TABS: 3 | 30 days supply | Qty: 30 | Fill #0

## 2020-02-12 NOTE — Progress Notes (Signed)
10/4/202111:21 AM  Ricardo Mills February 07, 1956, 64 y.o., male [id number]  Chief Complaint  Patient presents with  . Follow-up    follow up on headaches. Per patient the medication works because he no longer have the headaches.    HPI:   Patient is a 64 y.o. male with past medical history significant for DM2, HTN, HLP  who presents today for followup  Last OV 2 weeks ago - telemedicine, added amlodipine 92m at bedtime, referred to sleep, has appt nov 1st  He reports that he was having headaches when he was using insulin (lantis) plus it was too expensive He just started going back to the gym He has started working on diet He has completed diabetic education  Wt Readings from Last 3 Encounters:  02/12/20 171 lb 9.6 oz (77.8 kg)  12/11/19 177 lb 3.2 oz (80.4 kg)  08/14/19 176 lb (79.8 kg)   BP Readings from Last 3 Encounters:  02/12/20 122/81  12/11/19 (!) 153/88  10/01/19 (!) 162/106    Depression screen PHQ 2/9 02/12/2020 12/11/2019 08/14/2019  Decreased Interest 0 0 0  Down, Depressed, Hopeless 0 0 0  PHQ - 2 Score 0 0 0    Fall Risk  02/12/2020 12/11/2019 08/14/2019 05/26/2019 09/09/2018  Falls in the past year? 0 0 0 0 0  Number falls in past yr: 0 0 0 0 0  Injury with Fall? 0 0 0 0 0  Follow up Falls evaluation completed Falls evaluation completed - - -     No Known Allergies  Prior to Admission medications   Medication Sig Start Date End Date Taking? Authorizing Provider  amLODipine (NORVASC) 5 MG tablet Take 1 tablet (5 mg total) by mouth at bedtime. 01/29/20  Yes SRutherford Guys MD  blood glucose meter kit and supplies Per insurance preference. Test cbgs three times a day. Dx E11.65, Z79.4 09/09/18  Yes SRutherford Guys MD  canagliflozin ([facility] 300 MG TABS tablet Take 1 tablet (300 mg total) by mouth daily before breakfast. 12/11/19  Yes MMaximiano Coss NP  Insulin Pen Needle (PEN NEEDLES) 32G X 4 MM MISC 1 application by Does not apply route daily. 06/15/18  Yes  GWendie Agreste MD  losartan (COZAAR) 100 MG tablet Take 1 tablet (100 mg total) by mouth daily. 12/11/19  Yes MMaximiano Coss NP  metFORMIN (GLUCOPHAGE) 1000 MG tablet Take 1 tablet (1,000 mg total) by mouth 2 (two) times daily with a meal. 12/11/19  Yes MMaximiano Coss NP  sildenafil (REVATIO) 20 MG tablet Take 3 tabs by mouth once a day as needed. 05/26/19  Yes SRutherford Guys MD  simvastatin (ZOCOR) 20 MG tablet Take 1 tablet (20 mg total) by mouth every evening. 12/11/19  Yes MMaximiano Coss NP  sitaGLIPtin (JANUVIA) 100 MG tablet TAKE 1 TABLET (100 MG TOTAL) BY MOUTH DAILY. 12/11/19  Yes MMaximiano Coss NP    Past Medical History:  Diagnosis Date  . Diabetes mellitus [place] complication [place]   . Hyperlipidemia   . Hypertension     Past Surgical History:  Procedure Laterality Date  . CIRCUMCISION      Social History   Tobacco Use  . Smoking status: Never Smoker  . Smokeless tobacco: Never Used  Substance Use Topics  . Alcohol use: No    Alcohol/week: 0.0 standard drinks    Comment: former alcohol use/ not currently    Family History  Problem Relation Age of Onset  . Diabetes Mother   .  Hyperlipidemia Mother   . Hypertension Mother   . Diabetes Father   . Cancer Sister 24       uterus?  . Diabetes Sister   . Hypertension Brother   . Diabetes Sister   . Diabetes Sister   . Diabetes Sister   . Diabetes Brother   . Hyperlipidemia Brother   . Hypertension Brother   . Colon cancer Neg Hx     Review of Systems  Constitutional: Negative for chills and fever.  Respiratory: Negative for cough and shortness of breath.   Cardiovascular: Negative for chest pain, palpitations and leg swelling.  Gastrointestinal: Negative for abdominal pain, nausea and vomiting.     OBJECTIVE:  Today's Vitals   02/12/20 1115  BP: 122/81  Pulse: 86  Resp: 18  Temp: 98 F (36.7 C)  TempSrc: Temporal  SpO2: 96%  Weight: 171 lb 9.6 oz (77.8 kg)  Height: _0  (1.676 m)    Body mass index is 27.7 kg/m.   Physical Exam Vitals and nursing note reviewed.  Constitutional:      Appearance: He is well-developed.  HENT:     Head: Normocephalic and atraumatic.  Eyes:     Conjunctiva/sclera: Conjunctivae normal.     Pupils: Pupils are equal, round, and reactive to light.  Pulmonary:     Effort: Pulmonary effort is normal.  Musculoskeletal:     Cervical back: Neck supple.  Skin:    General: Skin is warm and dry.  Neurological:     Mental Status: He is alert and oriented to person, place, and time.     Results for orders placed or performed in visit on 02/12/20 (from the past 24 hour(s))  POCT A1C     Status: Abnormal   Collection Time: 02/12/20 11:57 AM  Result Value Ref Range   Hemoglobin A1C 12.7 (A) 4.0 - 5.6 %   HbA1c POC (<> result, manual entry)     HbA1c, POC (prediabetic range)     HbA1c, POC (controlled diabetic range)      No results found.   ASSESSMENT and PLAN  1. Primary hypertension Controlled. Continue current regime.  - Comprehensive metabolic panel  2. Type 2 diabetes mellitus with hyperglycemia, without long-term current use of insulin (HCC) Uncontrolled, discussed options. Trial of rybelsus, reviewed r/se/b. D/c januvia. Discussed LFM at length incl carb counting - POCT A1C - TSH  3. Hyperlipidemia associated with type 2 diabetes mellitus ([employer]) Checking labs today, medications will be adjusted as needed.  - Lipid panel  Other orders - Semaglutide (RYBELSUS) 3 MG TABS; Take 3 mg by mouth daily. - Semaglutide (RYBELSUS) 7 MG TABS; Take 7 mg by mouth daily.  Return in about 3 months (around 05/14/2020) for Elma.    Rutherford Guys, MD Primary [facility] at [facility], [place] [address] Ph.  [phone] Fax [phone]

## 2020-02-12 NOTE — Patient Instructions (Addendum)
Carbohydrate Counting for Diabetes Mellitus, Adult  Carbohydrate counting is a method of keeping track of how many carbohydrates you eat. Eating carbohydrates naturally increases the amount of sugar (glucose) in the blood. Counting how many carbohydrates you eat helps keep your blood glucose within normal limits, which helps you manage your diabetes (diabetes mellitus). It is important to know how many carbohydrates you can safely have in each meal. This is different for every person. A diet and nutrition specialist (registered dietitian) can help you make a meal plan and calculate how many carbohydrates you should have at each meal and snack. Carbohydrates are found in the following foods:  Grains, such as breads and cereals.  Dried beans and soy products.  Starchy vegetables, such as potatoes, peas, and corn.  Fruit and fruit juices.  Milk and yogurt.  Sweets and snack foods, such as cake, cookies, candy, chips, and soft drinks. How do I count carbohydrates? There are two ways to count carbohydrates in food. You can use either of the methods or a combination of both. Reading "Nutrition Facts" on packaged food The "Nutrition Facts" list is included on the labels of almost all packaged foods and beverages in the [place] It includes:  The serving size.  Information about nutrients in each serving, including the grams (g) of carbohydrate per serving. To use the "Nutrition Facts":  Decide how many servings you will have.  Multiply the number of servings by the number of carbohydrates per serving.  The resulting number is the total amount of carbohydrates that you will be having. Learning standard serving sizes of other foods When you eat carbohydrate foods that are not packaged or do not include "Nutrition Facts" on the label, you need to measure the servings in order to count the amount of carbohydrates:  Measure the foods that you will eat with a food scale or measuring cup, if  needed.  Decide how many standard-size servings you will eat.  Multiply the number of servings by 15. Most carbohydrate-rich foods have about 15 g of carbohydrates per serving. ? For example, if you eat 8 oz (170 g) of strawberries, you will have eaten 2 servings and 30 g of carbohydrates (2 servings x 15 g = 30 g).  For foods that have more than one food mixed, such as soups and casseroles, you must count the carbohydrates in each food that is included. The following list contains standard serving sizes of common carbohydrate-rich foods. Each of these servings has about 15 g of carbohydrates:   hamburger bun or  English muffin.   oz (15 mL) syrup.   oz (14 g) jelly.  1 slice of bread.  1 six-inch tortilla.  3 oz (85 g) cooked rice or pasta.  4 oz (113 g) cooked dried beans.  4 oz (113 g) starchy vegetable, such as peas, corn, or potatoes.  4 oz (113 g) hot cereal.  4 oz (113 g) mashed potatoes or  of a large baked potato.  4 oz (113 g) canned or frozen fruit.  4 oz (120 mL) fruit juice.  4-6 crackers.  6 chicken nuggets.  6 oz (170 g) unsweetened dry cereal.  6 oz (170 g) plain fat-free yogurt or yogurt sweetened with artificial sweeteners.  8 oz (240 mL) milk.  8 oz (170 g) fresh fruit or one small piece of fruit.  24 oz (680 g) popped popcorn. Example of carbohydrate counting Sample meal  3 oz (85 g) chicken breast.  6 oz (170  g) brown rice.  4 oz (113 g) corn.  8 oz (240 mL) milk.  8 oz (170 g) strawberries with sugar-free whipped topping. Carbohydrate calculation 1. Identify the foods that contain carbohydrates: ? Rice. ? Corn. ? Milk. ? Strawberries. 2. Calculate how many servings you have of each food: ? 2 servings rice. ? 1 serving corn. ? 1 serving milk. ? 1 serving strawberries. 3. Multiply each number of servings by 15 g: ? 2 servings rice x 15 g = 30 g. ? 1 serving corn x 15 g = 15 g. ? 1 serving milk x 15 g = 15 g. ? 1  serving strawberries x 15 g = 15 g. 4. Add together all of the amounts to find the total grams of carbohydrates eaten: ? 30 g + 15 g + 15 g + 15 g = 75 g of carbohydrates total. Summary  Carbohydrate counting is a method of keeping track of how many carbohydrates you eat.  Eating carbohydrates naturally increases the amount of sugar (glucose) in the blood.  Counting how many carbohydrates you eat helps keep your blood glucose within normal limits, which helps you manage your diabetes.  A diet and nutrition specialist (registered dietitian) can help you make a meal plan and calculate how many carbohydrates you should have at each meal and snack. This information is not intended to replace advice given to you by your health care provider. Make sure you discuss any questions you have with your health care provider. Document Revised: 11/19/2016 Document Reviewed: 10/09/2015 Elsevier Patient Education  [employer].    If you have lab work done today you will be contacted with your lab results within the next 2 weeks.  If you have not heard from [place] then please contact us. The fastest way to get your results is to register for My Chart.   IF you received an x-ray today, you will receive an invoice from [facility]. Please contact [facility] at [phone] with questions or concerns regarding your invoice.   IF you received labwork today, you will receive an invoice from [facility]. Please contact [facility] at [phone] with questions or concerns regarding your invoice.   Our billing staff will not be able to assist you with questions regarding bills from these companies.  You will be contacted with the lab results as soon as they are available. The fastest way to get your results is to activate your My Chart account. Instructions are located on the last page of this paperwork. If you have not heard from [place] regarding the results in 2 weeks, please contact this office.

## 2020-02-13 LAB — COMPREHENSIVE METABOLIC PANEL
ALT: 20 IU/L (ref 0–44)
AST: 14 IU/L (ref 0–40)
Albumin/Globulin Ratio: 1.8 (ref 1.2–2.2)
Albumin: 4.8 g/dL (ref 3.8–4.8)
Alkaline Phosphatase: 87 IU/L (ref 44–121)
BUN/Creatinine Ratio: 16 (ref 10–24)
BUN: 15 mg/dL (ref 8–27)
Bilirubin Total: 0.4 mg/dL (ref 0.0–1.2)
CO2: 20 mmol/L (ref 20–29)
Calcium: 10.1 mg/dL (ref 8.6–10.2)
Chloride: 98 mmol/L (ref 96–106)
Creatinine, Ser: 0.96 mg/dL (ref 0.76–1.27)
GFR calc Af Amer: 98 mL/min/{1.73_m2} (ref >59)
GFR calc non Af Amer: 84 mL/min/{1.73_m2} (ref >59)
Globulin, Total: 2.7 g/dL (ref 1.5–4.5)
Glucose: 284 mg/dL — ABNORMAL HIGH (ref 65–99)
Potassium: 4.7 mmol/L (ref 3.5–5.2)
Sodium: 133 mmol/L — ABNORMAL LOW (ref 134–144)
Total Protein: 7.5 g/dL (ref 6.0–8.5)

## 2020-02-13 LAB — LIPID PANEL
Chol/HDL Ratio: 2.7 ratio (ref 0.0–5.0)
Cholesterol, Total: 132 mg/dL (ref 100–199)
HDL: 49 mg/dL (ref >39)
LDL Chol Calc (NIH): 68 mg/dL (ref 0–99)
Triglycerides: 76 mg/dL (ref 0–149)
VLDL Cholesterol Cal: 15 mg/dL (ref 5–40)

## 2020-02-13 LAB — TSH: TSH: 1.37 u[IU]/mL (ref 0.450–4.500)

## 2020-02-26 ENCOUNTER — Ambulatory Visit: Payer: 59 | Admitting: Registered Nurse

## 2020-02-27 ENCOUNTER — Encounter: Payer: Self-pay | Admitting: Registered Nurse

## 2020-03-11 ENCOUNTER — Institutional Professional Consult (permissible substitution): Payer: 59 | Admitting: Neurology

## 2020-04-08 MED FILL — METFORMIN HCL 1000 MG TABS: 1000 | 90 days supply | Qty: 180 | Fill #0

## 2020-04-09 ENCOUNTER — Institutional Professional Consult (permissible substitution): Payer: 59 | Admitting: Neurology

## 2020-04-23 ENCOUNTER — Other Ambulatory Visit: Payer: Self-pay | Admitting: Registered Nurse

## 2020-04-23 ENCOUNTER — Encounter: Payer: Self-pay | Admitting: Registered Nurse

## 2020-04-23 ENCOUNTER — Other Ambulatory Visit: Payer: Self-pay

## 2020-04-23 ENCOUNTER — Ambulatory Visit (INDEPENDENT_AMBULATORY_CARE_PROVIDER_SITE_OTHER): Payer: 59 | Admitting: Registered Nurse

## 2020-04-23 VITALS — BP 125/84 | HR 86 | Temp 98.0°F | Resp 18 | Ht 66.0 in | Wt 175.0 lb

## 2020-04-23 DIAGNOSIS — I10 Essential (primary) hypertension: Secondary | ICD-10-CM

## 2020-04-23 MED ORDER — METOPROLOL SUCCINATE ER 25 MG PO TB24: 25.0000 mg | ORAL_TABLET | Freq: Every day | ORAL | 3 refills | Status: DC

## 2020-04-23 MED FILL — METOPROLOL SUCCINATE ER 25: 25 | 90 days supply | Qty: 90 | Fill #0

## 2020-04-23 NOTE — Patient Instructions (Signed)
° ° ° °  If you have lab work done today you will be contacted with your lab results within the next 2 weeks.  If you have not heard from us then please contact us. The fastest way to get your results is to register for My Chart. ° ° °IF you received an x-ray today, you will receive an invoice from [facility]. Please contact [facility] at [phone] with questions or concerns regarding your invoice.  ° °IF you received labwork today, you will receive an invoice from [facility]. Please contact [facility] at [phone] with questions or concerns regarding your invoice.  ° °Our billing staff will not be able to assist you with questions regarding bills from these companies. ° °You will be contacted with the lab results as soon as they are available. The fastest way to get your results is to activate your My Chart account. Instructions are located on the last page of this paperwork. If you have not heard from us regarding the results in 2 weeks, please contact this office. °  ° ° ° °

## 2020-04-29 MED FILL — SIMVASTATIN 20 MG TABLET: 20 | 90 days supply | Qty: 90 | Fill #1

## 2020-04-29 MED FILL — AMLODIPINE BESYLATE 5 MG TA: 5 | 90 days supply | Qty: 90 | Fill #1

## 2020-05-14 ENCOUNTER — Institutional Professional Consult (permissible substitution): Payer: 59 | Admitting: Neurology

## 2020-05-14 ENCOUNTER — Telehealth: Payer: Self-pay | Admitting: Neurology

## 2020-05-14 ENCOUNTER — Ambulatory Visit: Payer: 59 | Admitting: Registered Nurse

## 2020-05-14 NOTE — Telephone Encounter (Signed)
Please send a dismissal letter- 3 no shows. CD

## 2020-05-14 NOTE — Telephone Encounter (Signed)
Pt was no show to sleep consult today. Per Sheena in sleep lab this makes the patient's 3rd no show. Will send to MD and Angie to assess if should dismiss.

## 2020-05-15 ENCOUNTER — Encounter: Payer: Self-pay | Admitting: Neurology

## 2020-05-21 ENCOUNTER — Encounter: Payer: Self-pay | Admitting: Neurology

## 2020-05-27 ENCOUNTER — Other Ambulatory Visit: Payer: Self-pay | Admitting: Registered Nurse

## 2020-05-27 ENCOUNTER — Other Ambulatory Visit: Payer: Self-pay

## 2020-05-27 ENCOUNTER — Telehealth (INDEPENDENT_AMBULATORY_CARE_PROVIDER_SITE_OTHER): Payer: 59 | Admitting: Registered Nurse

## 2020-05-27 DIAGNOSIS — E1165 Type 2 diabetes mellitus with hyperglycemia: Secondary | ICD-10-CM

## 2020-05-27 DIAGNOSIS — Z794 Long term (current) use of insulin: Secondary | ICD-10-CM

## 2020-05-27 DIAGNOSIS — E785 Hyperlipidemia, unspecified: Secondary | ICD-10-CM

## 2020-05-27 DIAGNOSIS — I10 Essential (primary) hypertension: Secondary | ICD-10-CM

## 2020-05-27 MED ORDER — CANAGLIFLOZIN 300 MG PO TABS: 300.0000 mg | ORAL_TABLET | Freq: Every day | ORAL | 1 refills | Status: DC

## 2020-05-27 MED ORDER — LOSARTAN POTASSIUM 100 MG PO TABS: 100.0000 mg | ORAL_TABLET | Freq: Every day | ORAL | 1 refills | Status: DC

## 2020-05-27 MED ORDER — RYBELSUS 7 MG PO TABS: 7.0000 mg | ORAL_TABLET | Freq: Every day | ORAL | 3 refills | Status: DC

## 2020-05-27 MED ORDER — METFORMIN HCL 1000 MG PO TABS: 1000.0000 mg | ORAL_TABLET | Freq: Two times a day (BID) | ORAL | 0 refills | Status: DC

## 2020-05-27 MED ORDER — METOPROLOL SUCCINATE ER 25 MG PO TB24: 25.0000 mg | ORAL_TABLET | Freq: Every day | ORAL | 3 refills | Status: DC

## 2020-05-27 MED ORDER — SIMVASTATIN 20 MG PO TABS: 20.0000 mg | ORAL_TABLET | Freq: Every evening | ORAL | 1 refills | Status: DC

## 2020-05-27 MED FILL — LOSARTAN POTASSIUM 100 MG T: 100 | 30 days supply | Qty: 30 | Fill #0

## 2020-05-27 NOTE — Patient Instructions (Signed)
° ° ° °  If you have lab work done today you will be contacted with your lab results within the next 2 weeks.  If you have not heard from us then please contact us. The fastest way to get your results is to register for My Chart. ° ° °IF you received an x-ray today, you will receive an invoice from [facility]. Please contact [facility] at [phone] with questions or concerns regarding your invoice.  ° °IF you received labwork today, you will receive an invoice from [facility]. Please contact [facility] at [phone] with questions or concerns regarding your invoice.  ° °Our billing staff will not be able to assist you with questions regarding bills from these companies. ° °You will be contacted with the lab results as soon as they are available. The fastest way to get your results is to activate your My Chart account. Instructions are located on the last page of this paperwork. If you have not heard from us regarding the results in 2 weeks, please contact this office. °  ° ° ° °

## 2020-05-28 MED FILL — JANUVIA 100 MG TABLET: 100 | 90 days supply | Qty: 90 | Fill #1

## 2020-05-28 NOTE — Progress Notes (Signed)
Telemedicine Encounter- SOAP NOTE Established Patient  This telephone encounter was conducted with the patient's (or proxy's) verbal consent via audio telecommunications: yes  Patient was instructed to have this encounter in a suitably private space; and to only have persons present to whom they give permission to participate. In addition, patient identity was confirmed by use of name plus two identifiers (DOB and address).  I discussed the limitations, risks, security and privacy concerns of performing an evaluation and management service by telephone and the availability of in person appointments. I also discussed with the patient that there may be a patient responsible charge related to this service. The patient expressed understanding and agreed to proceed.  I spent a total of 15 minutes talking with the patient or their proxy.  Patient at home Provider in office  Chief Complaint  Patient presents with  . Hypertension    Pt doing well headaches have subsided since change in BP med.  . Diabetes    Pt doing well no checking BG was encouraged to check this at least 1 time daily     Subjective   Ricardo Mills is a 65 y.o. established patient. Telephone visit today for htn and t2dm  HPI Doing well  Taking meds as prescribed Diet and lifestyle are steady No new symptoms or concerns  Plans to come into office for labs later this week Will place future orders  Patient Active Problem List   Diagnosis Date Noted  . Halitosis 03/28/2012  . Diabetes ([place]) 06/21/2011  . HTN (hypertension) 06/21/2011  . Hyperlipidemia LDL goal <70 06/21/2011  . ED (erectile dysfunction) 06/21/2011  . Diabetes mellitus type 2, uncomplicated ([place]) 20/02/711    Past Medical History:  Diagnosis Date  . Diabetes mellitus without complication ([place])   . Hyperlipidemia   . Hypertension     Current Outpatient Medications  Medication Sig Dispense Refill  . blood glucose meter kit and supplies  Per insurance preference. Test cbgs three times a day. Dx E11.65, Z79.4 1 each 11  . Semaglutide (RYBELSUS) 3 MG TABS Take 3 mg by mouth daily. 30 tablet 0  . sildenafil (REVATIO) 20 MG tablet Take 3 tabs by mouth once a day as needed. 30 tablet 11  . canagliflozin (INVOKANA) 300 MG TABS tablet Take 1 tablet (300 mg total) by mouth daily before breakfast. 90 tablet 1  . losartan (COZAAR) 100 MG tablet Take 1 tablet (100 mg total) by mouth daily. 90 tablet 1  . metFORMIN (GLUCOPHAGE) 1000 MG tablet Take 1 tablet (1,000 mg total) by mouth 2 (two) times daily with a meal. 180 tablet 0  . metoprolol succinate (TOPROL-XL) 25 MG 24 hr tablet Take 1 tablet (25 mg total) by mouth daily. 90 tablet 3  . Semaglutide (RYBELSUS) 7 MG TABS Take 7 mg by mouth daily. 30 tablet 3  . simvastatin (ZOCOR) 20 MG tablet Take 1 tablet (20 mg total) by mouth every evening. 90 tablet 1   No current facility-administered medications for this visit.    No Known Allergies  Social History   Socioeconomic History  . Marital status: Married    Spouse name: [employer]  . Number of children: 4  . Years of education: 36  . Highest education level: Not on file  Occupational History  . Occupation: Systems developer in boxes    Comment: [employer]-13 hours/day, 3 days/week  Tobacco Use  . Smoking status: Never Smoker  . Smokeless tobacco: Never Used  Vaping Use  . Vaping Use: Never used  Substance and Sexual Activity  . Alcohol use: No    Alcohol/week: 0.0 standard drinks    Comment: former alcohol use/ not currently  . Drug use: No  . Sexual activity: Yes    Partners: Female  Other Topics Concern  . Not on file  Social History Narrative   Ricardo Mills   Lives with his wife.   Regular exercise: occasionally   Caffeine use: coffee daily   Social Determinants of Health   Financial Resource Strain: Not on file  Food Insecurity: Not on file  Transportation Needs: Not on file  Physical  Activity: Not on file  Stress: Not on file  Social Connections: Not on file  Intimate Partner Violence: Not on file    Review of Systems  Constitutional: Negative.   HENT: Negative.   Eyes: Negative.   Respiratory: Negative.   Cardiovascular: Negative.   Gastrointestinal: Negative.   Genitourinary: Negative.   Musculoskeletal: Negative.   Skin: Negative.   Neurological: Negative.   Endo/Heme/Allergies: Negative.   Psychiatric/Behavioral: Negative.   All other systems reviewed and are negative.   Objective   Vitals as reported by the patient: There were no vitals filed for this visit.  Ricardo Mills was seen today for hypertension and diabetes.  Diagnoses and all orders for this visit:  Type 2 diabetes mellitus with hyperglycemia, with long-term current use of insulin (HCC) -     Semaglutide (RYBELSUS) 7 MG TABS; Take 7 mg by mouth daily. -     canagliflozin (INVOKANA) 300 MG TABS tablet; Take 1 tablet (300 mg total) by mouth daily before breakfast. -     metFORMIN (GLUCOPHAGE) 1000 MG tablet; Take 1 tablet (1,000 mg total) by mouth 2 (two) times daily with a meal. -     CBC With Differential; Future -     Comprehensive metabolic panel; Future -     Hemoglobin A1c; Future -     Lipid panel; Future  Primary hypertension -     metoprolol succinate (TOPROL-XL) 25 MG 24 hr tablet; Take 1 tablet (25 mg total) by mouth daily. -     CBC With Differential; Future -     Comprehensive metabolic panel; Future -     Hemoglobin A1c; Future -     Lipid panel; Future  Hyperlipidemia LDL goal <70 -     simvastatin (ZOCOR) 20 MG tablet; Take 1 tablet (20 mg total) by mouth every evening. -     CBC With Differential; Future -     Comprehensive metabolic panel; Future -     Hemoglobin A1c; Future -     Lipid panel; Future  Essential hypertension -     losartan (COZAAR) 100 MG tablet; Take 1 tablet (100 mg total) by mouth daily. -     CBC With Differential; Future -     Comprehensive  metabolic panel; Future -     Hemoglobin A1c; Future -     Lipid panel; Future   PLAN  Future labs  Refill x 3 mo  If A1c and vitals wnl can extend follow up to 6 mo  Patient encouraged to call clinic with any questions, comments, or concerns.  I discussed the assessment and treatment plan with the patient. The patient was provided an opportunity to ask questions and all were answered. The patient agreed with the plan and demonstrated an understanding of the instructions.   The patient was advised to call back  or seek an in-person evaluation if the symptoms worsen or if the condition fails to improve as anticipated.  I provided 12 minutes of non-face-to-face time during this encounter.  Maximiano Coss, NP  Primary Care at [facility]

## 2020-06-05 ENCOUNTER — Telehealth: Payer: Self-pay | Admitting: *Deleted

## 2020-06-05 NOTE — Telephone Encounter (Signed)
Rich,  The insurance is denying the Invokana.  The Marcelline Deist was denied in January of last year, but they are now saying it is a covered alternative.     Please advise

## 2020-07-01 MED FILL — METOPROLOL SUCCINATE ER 25: 25 | 90 days supply | Qty: 90 | Fill #1

## 2020-07-01 MED FILL — LOSARTAN POTASSIUM 100 MG T: 100 | 30 days supply | Qty: 30 | Fill #1

## 2020-07-15 MED FILL — METFORMIN HCL 1000 MG TABS: 1000 | 90 days supply | Qty: 180 | Fill #0

## 2020-07-21 ENCOUNTER — Encounter: Payer: Self-pay | Admitting: Registered Nurse

## 2020-07-21 NOTE — Progress Notes (Signed)
Established Patient Office Visit  Subjective:  Patient ID: Ricardo Mills, male    DOB: 1955-10-01  Age: 65 y.o. MRN: [id number]  CC:  Chief Complaint  Patient presents with  . Hypertension    Patient states he is here to discuss his hypertension. Per patient he would also discuss medication.    HPI [name] presents for htn  Hypertension: Patient Currently taking: losartan 128m po qd, metoprolol 25 mg ER po qd. Good effect. No AEs. Denies CV symptoms including: chest pain, shob, doe, headache, visual changes, fatigue, claudication, and dependent edema.   Previous readings and labs: BP Readings from Last 3 Encounters:  04/23/20 125/84  02/12/20 122/81  12/11/19 (!) 153/88   Lab Results  Component Value Date   CREATININE 0.96 02/12/2020      Past Medical History:  Diagnosis Date  . Diabetes mellitus without complication ([place]   . Hyperlipidemia   . Hypertension     Past Surgical History:  Procedure Laterality Date  . CIRCUMCISION      Family History  Problem Relation Age of Onset  . Diabetes Mother   . Hyperlipidemia Mother   . Hypertension Mother   . Diabetes Father   . Cancer Sister 679      uterus?  . Diabetes Sister   . Hypertension Brother   . Diabetes Sister   . Diabetes Sister   . Diabetes Sister   . Diabetes Brother   . Hyperlipidemia Brother   . Hypertension Brother   . Colon cancer Neg Hx     Social History   Socioeconomic History  . Marital status: Married    Spouse name: [name] . Number of children: 4  . Years of education: 165 . Highest education level: Not on file  Occupational History  . Occupation: PSystems developerin boxes    Comment: [employer]-13 hours/day, 3 days/week  Tobacco Use  . Smoking status: Never Smoker  . Smokeless tobacco: Never Used  Vaping Use  . Vaping Use: Never used  Substance and Sexual Activity  . Alcohol use: No    Alcohol/week: 0.0 standard drinks    Comment:  former alcohol use/ not currently  . Drug use: No  . Sexual activity: Yes    Partners: Female  Other Topics Concern  . Not on file  Social History Narrative   FRoyce [employer]   Lives with his wife.   Regular exercise: occasionally   Caffeine use: coffee daily   Social Determinants of Health   Financial Resource Strain: Not on file  Food Insecurity: Not on file  Transportation Needs: Not on file  Physical Activity: Not on file  Stress: Not on file  Social Connections: Not on file  Intimate Partner Violence: Not on file    Outpatient Medications Prior to Visit  Medication Sig Dispense Refill  . blood glucose meter kit and supplies Per insurance preference. Test cbgs three times a day. Dx E11.65, Z79.4 1 each 11  . Semaglutide (RYBELSUS) 3 MG TABS Take 3 mg by mouth daily. 30 tablet 0  . sildenafil (REVATIO) 20 MG tablet Take 3 tabs by mouth once a day as needed. 30 tablet 11  . amLODipine (NORVASC) 5 MG tablet Take 1 tablet (5 mg total) by mouth at bedtime. 90 tablet 1  . canagliflozin (INVOKANA) 300 MG TABS tablet Take 1 tablet (300 mg total) by mouth daily before breakfast. 90 tablet 1  . losartan (COZAAR) 100 MG tablet  Take 1 tablet (100 mg total) by mouth daily. 90 tablet 1  . metFORMIN (GLUCOPHAGE) 1000 MG tablet Take 1 tablet (1,000 mg total) by mouth 2 (two) times daily with a meal. 180 tablet 0  . Semaglutide (RYBELSUS) 7 MG TABS Take 7 mg by mouth daily. 30 tablet 3  . simvastatin (ZOCOR) 20 MG tablet Take 1 tablet (20 mg total) by mouth every evening. 90 tablet 1   No facility-administered medications prior to visit.    No Known Allergies  ROS Review of Systems  Constitutional: Negative.   HENT: Negative.   Eyes: Negative.   Respiratory: Negative.   Cardiovascular: Negative.   Gastrointestinal: Negative.   Genitourinary: Negative.   Musculoskeletal: Negative.   Skin: Negative.   Neurological: Negative.   Psychiatric/Behavioral: Negative.   All other systems  reviewed and are negative.     Objective:    Physical Exam Constitutional:      General: He is not in acute distress.    Appearance: Normal appearance. He is normal weight. He is not ill-appearing, toxic-appearing or diaphoretic.  Cardiovascular:     Rate and Rhythm: Normal rate and regular rhythm.     Heart sounds: Normal heart sounds. No murmur heard. No friction rub. No gallop.   Pulmonary:     Effort: Pulmonary effort is normal. No respiratory distress.     Breath sounds: Normal breath sounds. No stridor. No wheezing, rhonchi or rales.  Chest:     Chest wall: No tenderness.  Neurological:     General: No focal deficit present.     Mental Status: He is alert and oriented to person, place, and time. Mental status is at baseline.  Psychiatric:        Mood and Affect: Mood normal.        Behavior: Behavior normal.        Thought Content: Thought content normal.        Judgment: Judgment normal.     BP 125/84   Pulse 86   Temp 98 F (36.7 C) (Temporal)   Resp 18   Ht 5' 6"  (1.676 m)   Wt 175 lb (79.4 kg)   SpO2 98%   BMI 28.25 kg/m  Wt Readings from Last 3 Encounters:  04/23/20 175 lb (79.4 kg)  02/12/20 171 lb 9.6 oz (77.8 kg)  12/11/19 177 lb 3.2 oz (80.4 kg)     Health Maintenance Due  Topic Date Due  . COVID-19 Vaccine (3 - Booster for Pfizer series) 02/14/2020  . OPHTHALMOLOGY EXAM  06/28/2020    There are no preventive care reminders to display for this patient.  Lab Results  Component Value Date   TSH 1.370 02/12/2020   Lab Results  Component Value Date   WBC 4.6 09/21/2017   HGB 13.9 09/21/2017   HCT 39.9 09/21/2017   MCV 75 (L) 09/21/2017   PLT 167 09/21/2017   Lab Results  Component Value Date   NA 133 (L) 02/12/2020   K 4.7 02/12/2020   CO2 20 02/12/2020   GLUCOSE 284 (H) 02/12/2020   BUN 15 02/12/2020   CREATININE 0.96 02/12/2020   BILITOT 0.4 02/12/2020   ALKPHOS 87 02/12/2020   AST 14 02/12/2020   ALT 20 02/12/2020   PROT 7.5  02/12/2020   ALBUMIN 4.8 02/12/2020   CALCIUM 10.1 02/12/2020   Lab Results  Component Value Date   CHOL 132 02/12/2020   Lab Results  Component Value Date   HDL 49 02/12/2020  Lab Results  Component Value Date   LDLCALC 68 02/12/2020   Lab Results  Component Value Date   TRIG 76 02/12/2020   Lab Results  Component Value Date   CHOLHDL 2.7 02/12/2020   Lab Results  Component Value Date   HGBA1C 12.7 (A) 02/12/2020      Assessment & Plan:   Problem List Items Addressed This Visit      Cardiovascular and Mediastinum   HTN (hypertension) - Primary (Chronic)      Meds ordered this encounter  Medications  . DISCONTD: metoprolol succinate (TOPROL-XL) 25 MG 24 hr tablet    Sig: Take 1 tablet (25 mg total) by mouth daily.    Dispense:  90 tablet    Refill:  3    Order Specific Question:   Supervising Provider    Answer:   Carlota Raspberry, JEFFREY R [[id number]]    Follow-up: No follow-ups on file.   PLAN  Refill meds  bp controlled  Return in 6 mo  Patient encouraged to call clinic with any questions, comments, or concerns.  Maximiano Coss, NP

## 2020-07-31 MED FILL — SIMVASTATIN 20 MG TABLET: 20 | 90 days supply | Qty: 90 | Fill #0

## 2020-08-05 ENCOUNTER — Other Ambulatory Visit: Payer: Self-pay | Admitting: Registered Nurse

## 2020-08-05 MED FILL — LOSARTAN POTASSIUM 100 MG T: 100 | 30 days supply | Qty: 30 | Fill #2

## 2020-08-10 ENCOUNTER — Other Ambulatory Visit (HOSPITAL_COMMUNITY): Payer: Self-pay

## 2020-08-27 ENCOUNTER — Other Ambulatory Visit (HOSPITAL_COMMUNITY): Payer: Self-pay

## 2020-08-27 ENCOUNTER — Other Ambulatory Visit: Payer: Self-pay | Admitting: Registered Nurse

## 2020-08-27 DIAGNOSIS — Z794 Long term (current) use of insulin: Secondary | ICD-10-CM

## 2020-08-27 DIAGNOSIS — E1165 Type 2 diabetes mellitus with hyperglycemia: Secondary | ICD-10-CM

## 2020-09-10 ENCOUNTER — Other Ambulatory Visit: Payer: Self-pay | Admitting: Registered Nurse

## 2020-09-10 ENCOUNTER — Other Ambulatory Visit (HOSPITAL_COMMUNITY): Payer: Self-pay

## 2020-09-10 DIAGNOSIS — Z794 Long term (current) use of insulin: Secondary | ICD-10-CM

## 2020-09-10 DIAGNOSIS — E1165 Type 2 diabetes mellitus with hyperglycemia: Secondary | ICD-10-CM

## 2020-09-10 MED FILL — Losartan Potassium Tab 100 MG: ORAL | 90 days supply | Qty: 90 | Fill #0 | Status: AC

## 2020-09-11 ENCOUNTER — Other Ambulatory Visit: Payer: Self-pay

## 2020-09-11 ENCOUNTER — Ambulatory Visit (HOSPITAL_COMMUNITY)
Admission: EM | Admit: 2020-09-11 | Discharge: 2020-09-11 | Disposition: A | Discharge status: Home / Self Care | Payer: 59 | Attending: Urgent Care | Admitting: Urgent Care

## 2020-09-11 ENCOUNTER — Other Ambulatory Visit (HOSPITAL_COMMUNITY): Payer: Self-pay

## 2020-09-11 ENCOUNTER — Encounter (HOSPITAL_COMMUNITY): Payer: Self-pay

## 2020-09-11 DIAGNOSIS — I10 Essential (primary) hypertension: Secondary | ICD-10-CM

## 2020-09-11 DIAGNOSIS — R519 Headache, unspecified: Secondary | ICD-10-CM | POA: Diagnosis not present

## 2020-09-11 DIAGNOSIS — R030 Elevated blood-pressure reading, without diagnosis of hypertension: Secondary | ICD-10-CM

## 2020-09-11 MED ORDER — AMLODIPINE BESYLATE 2.5 MG PO TABS
2.5000 mg | ORAL_TABLET | Freq: Every day | ORAL | 0 refills | Status: DC
Start: 2020-09-11 — End: 2023-12-28
  Filled 2020-09-11: qty 90, 90d supply, fill #0

## 2020-09-11 NOTE — ED Triage Notes (Signed)
Pt presents with headache X 4 days.

## 2020-09-11 NOTE — Discharge Instructions (Addendum)

## 2020-09-11 NOTE — ED Provider Notes (Signed)
[facility]   MRN: [id number] DOB: 05/12/1955  Subjective:   Ricardo Mills is a 65 y.o. male presenting for 4-day history of intermittent dull headaches over the crown of his head with sharp pains over the occiput.  Denies vision change, confusion, dizziness, chest pain, belly pain, hematuria.  Denies history of heart disease, stroke, MI.  Patient has used Tylenol with some relief of his headaches. Has not been practicing a hypertensive friendly diet.   No current facility-administered medications for this encounter.  Current Outpatient Medications:  .  blood glucose meter kit and supplies, Per insurance preference. Test cbgs three times a day. Dx E11.65, Z79.4, Disp: 1 each, Rfl: 11 .  canagliflozin (INVOKANA) 300 MG TABS tablet, TAKE 1 TABLET (300 MG TOTAL) BY MOUTH DAILY BEFORE BREAKFAST., Disp: 90 tablet, Rfl: 1 .  losartan (COZAAR) 100 MG tablet, TAKE 1 TABLET (100 MG TOTAL) BY MOUTH DAILY., Disp: 90 tablet, Rfl: 1 .  metFORMIN (GLUCOPHAGE) 1000 MG tablet, TAKE 1 TABLET (1,000 MG TOTAL) BY MOUTH 2 (TWO) TIMES DAILY WITH A MEAL., Disp: 180 tablet, Rfl: 0 .  metoprolol succinate (TOPROL-XL) 25 MG 24 hr tablet, TAKE 1 TABLET (25 MG TOTAL) BY MOUTH DAILY., Disp: 90 tablet, Rfl: 3 .  Semaglutide 3 MG TABS, TAKE 1 TABLET BY MOUTH ONCE DAILY (STOP JANUVIA), Disp: 30 tablet, Rfl: 0 .  Semaglutide 7 MG TABS, TAKE 1 TABLET BY MOUTH DAILY, Disp: 30 tablet, Rfl: 3 .  sildenafil (REVATIO) 20 MG tablet, Take 3 tabs by mouth once a day as needed., Disp: 30 tablet, Rfl: 11 .  simvastatin (ZOCOR) 20 MG tablet, TAKE 1 TABLET (20 MG TOTAL) BY MOUTH EVERY EVENING., Disp: 90 tablet, Rfl: 1   No Known Allergies  Past Medical History:  Diagnosis Date  . Diabetes mellitus without complication ([place])   . Hyperlipidemia   . Hypertension      Past Surgical History:  Procedure Laterality Date  . CIRCUMCISION      Family History  Problem Relation Age of Onset  . Diabetes Mother   .  Hyperlipidemia Mother   . Hypertension Mother   . Diabetes Father   . Cancer Sister 73       uterus?  . Diabetes Sister   . Hypertension Brother   . Diabetes Sister   . Diabetes Sister   . Diabetes Sister   . Diabetes Brother   . Hyperlipidemia Brother   . Hypertension Brother   . Colon cancer Neg Hx     Social History   Tobacco Use  . Smoking status: Never Smoker  . Smokeless tobacco: Never Used  Vaping Use  . Vaping Use: Never used  Substance Use Topics  . Alcohol use: No    Alcohol/week: 0.0 standard drinks    Comment: former alcohol use/ not currently  . Drug use: No    ROS   Objective:   Vitals: BP (!) 152/82 (BP Location: Right Arm)   Pulse 68   Temp 97.9 F (36.6 C) (Oral)   Resp 18   SpO2 100%   BP Readings from Last 3 Encounters:  09/11/20 (!) 152/82  04/23/20 125/84  02/12/20 122/81   Physical Exam Constitutional:      General: He is not in acute distress.    Appearance: Normal appearance. He is well-developed. He is not ill-appearing, toxic-appearing or diaphoretic.  HENT:     Head: Normocephalic and atraumatic.     Right Ear: External ear normal.  Left Ear: External ear normal.     Nose: Nose normal.     Mouth/Throat:     Mouth: Mucous membranes are moist.     Pharynx: Oropharynx is clear.  Eyes:     General: No scleral icterus.    Extraocular Movements: Extraocular movements intact.     Pupils: Pupils are equal, round, and reactive to light.  Cardiovascular:     Rate and Rhythm: Normal rate and regular rhythm.     Heart sounds: Normal heart sounds. No murmur heard. No friction rub. No gallop.   Pulmonary:     Effort: Pulmonary effort is normal. No respiratory distress.     Breath sounds: Normal breath sounds. No stridor. No wheezing, rhonchi or rales.  Skin:    General: Skin is warm and dry.  Neurological:     Mental Status: He is alert and oriented to person, place, and time.     Cranial Nerves: No cranial nerve deficit.      Motor: No weakness.     Coordination: Romberg sign negative. Coordination normal.     Gait: Gait normal.     Deep Tendon Reflexes: Reflexes normal.     Comments: Negative pronator drift.   Psychiatric:        Mood and Affect: Mood normal. Mood is not anxious or depressed.        Behavior: Behavior normal. Behavior is not agitated.        Thought Content: Thought content normal.        Cognition and Memory: Cognition is not impaired. Memory is not impaired.     Assessment and Plan :   PDMP not reviewed this encounter.  1. Generalized headaches   2. Essential hypertension   3. Elevated blood pressure reading     Patient has a normal neurologic exam.  His headaches are responding to Tylenol and his blood pressure is currently uncontrolled due to poor diet.  Counseled on the possibility of stroke and recommended ER visit should his symptoms persist or should he develop other signs and symptoms of stroke. Counseled patient on potential for adverse effects with medications prescribed today, patient verbalized understanding.  Otherwise we will place patient into the PCP assistance program for further management of his blood pressure.    Jaynee Eagles, PA-C 09/11/20 1444

## 2020-09-12 ENCOUNTER — Telehealth (HOSPITAL_BASED_OUTPATIENT_CLINIC_OR_DEPARTMENT_OTHER): Payer: Self-pay

## 2020-09-12 NOTE — Telephone Encounter (Signed)
-----   Message from Aaron Edelman, RN sent at 09/12/2020 12:07 PM EDT ----- Regarding: UC to PCP Patient needs to establish with PCP - routine

## 2020-09-25 ENCOUNTER — Other Ambulatory Visit: Payer: Self-pay

## 2020-09-25 ENCOUNTER — Encounter (HOSPITAL_COMMUNITY): Payer: Self-pay

## 2020-09-25 ENCOUNTER — Ambulatory Visit (HOSPITAL_COMMUNITY)
Admission: EM | Admit: 2020-09-25 | Discharge: 2020-09-25 | Disposition: A | Discharge status: Home / Self Care | Payer: 59 | Attending: Physician Assistant | Admitting: Physician Assistant

## 2020-09-25 DIAGNOSIS — R110 Nausea: Secondary | ICD-10-CM | POA: Diagnosis not present

## 2020-09-25 MED ORDER — ONDANSETRON 4 MG PO TBDP
4.0000 mg | ORAL_TABLET | Freq: Three times a day (TID) | ORAL | 0 refills | Status: DC | PRN
Start: 2020-09-25 — End: 2023-12-28

## 2020-09-25 MED ORDER — PANTOPRAZOLE SODIUM 20 MG PO TBEC
20.0000 mg | DELAYED_RELEASE_TABLET | Freq: Every day | ORAL | 0 refills | Status: DC
Start: 2020-09-25 — End: 2023-12-28

## 2020-09-25 NOTE — Discharge Instructions (Signed)
Take pantoprazole on an empty stomach first thing in the morning separating from food and other medication by 30 minutes.  This will help coat your stomach in case acid reflux is contributing to your symptoms.  Use Zofran under the tongue up to 3 times a day as needed for nausea.  Eat a bland diet and drink plenty of fluid.  If you have any worsening symptoms please return.  Follow-up with PCP early next week if symptoms persist.

## 2020-09-25 NOTE — ED Provider Notes (Signed)
[facility]    CSN: [id number] Arrival date & time: 09/25/20  1902      History   Chief Complaint Chief Complaint  Patient presents with  . Nausea    HPI Ricardo Mills is a 65 y.o. male.   Patient presents today with a weeklong history of intermittent nausea.  He does report symptoms are worse with eating but also occur randomly.  He denies any associated vomiting, abdominal pain, fever, chest pain, shortness of breath, changes in bowel habits.  He does report recently being started on amlodipine prior to symptom onset and wonders if this could be contributing to symptoms.  He takes metformin and semaglutide but has been stable on these medications for a while and denies any recent dose adjustment.  He denies regular alcohol use.  Denies any specific contact or recent travel.  Does report foster child had GI bug but denies additional sick contacts.  Denies any recent antibiotic use.  He has not tried any over-the-counter medications for symptom management.  Denies history of abdominal surgery.  Denies history of GERD or other gastrointestinal disorder.     Past Medical History:  Diagnosis Date  . Diabetes mellitus without complication ([place])   . Hyperlipidemia   . Hypertension     Patient Active Problem List   Diagnosis Date Noted  . Halitosis 03/28/2012  . Diabetes ([place]) 06/21/2011  . HTN (hypertension) 06/21/2011  . Hyperlipidemia LDL goal <70 06/21/2011  . ED (erectile dysfunction) 06/21/2011  . Diabetes mellitus type 2, uncomplicated ([place]) 53/29/9242    Past Surgical History:  Procedure Laterality Date  . CIRCUMCISION         Home Medications    Prior to Admission medications   Medication Sig Start Date End Date Taking? Authorizing Provider  ondansetron (ZOFRAN ODT) 4 MG disintegrating tablet Take 1 tablet (4 mg total) by mouth every 8 (eight) hours as needed for nausea or vomiting. 09/25/20  Yes Rosina Cressler K, PA-C  pantoprazole (PROTONIX) 20 MG  tablet Take 1 tablet (20 mg total) by mouth daily. 09/25/20  Yes Makalya Nave K, PA-C  amLODipine (NORVASC) 2.5 MG tablet Take 1 tablet (2.5 mg total) by mouth daily. 09/11/20   Jaynee Eagles, PA-C  blood glucose meter kit and supplies Per insurance preference. Test cbgs three times a day. Dx E11.65, Z79.4 09/09/18   Jacelyn Pi, Lilia Argue, MD  canagliflozin (INVOKANA) 300 MG TABS tablet TAKE 1 TABLET (300 MG TOTAL) BY MOUTH DAILY BEFORE BREAKFAST. 05/27/20 05/27/21  Maximiano Coss, NP  losartan (COZAAR) 100 MG tablet TAKE 1 TABLET (100 MG TOTAL) BY MOUTH DAILY. 05/27/20 05/27/21  Maximiano Coss, NP  metFORMIN (GLUCOPHAGE) 1000 MG tablet TAKE 1 TABLET (1,000 MG TOTAL) BY MOUTH 2 (TWO) TIMES DAILY WITH A MEAL. 05/27/20 05/27/21  Maximiano Coss, NP  metoprolol succinate (TOPROL-XL) 25 MG 24 hr tablet TAKE 1 TABLET (25 MG TOTAL) BY MOUTH DAILY. 05/27/20 05/27/21  Maximiano Coss, NP  Semaglutide 3 MG TABS TAKE 1 TABLET BY MOUTH ONCE DAILY (STOP JANUVIA) 02/12/20 02/11/21  Jacelyn Pi, Lilia Argue, MD  Semaglutide 7 MG TABS TAKE 1 TABLET BY MOUTH DAILY 05/27/20 05/27/21  Maximiano Coss, NP  sildenafil (REVATIO) 20 MG tablet Take 3 tabs by mouth once a day as needed. 05/26/19   Jacelyn Pi, Lilia Argue, MD  simvastatin (ZOCOR) 20 MG tablet TAKE 1 TABLET (20 MG TOTAL) BY MOUTH EVERY EVENING. 05/27/20 05/27/21  Maximiano Coss, NP  sitaGLIPtin (JANUVIA) 100 MG tablet TAKE 1  TABLET (100 MG TOTAL) BY MOUTH DAILY. 12/11/19 02/12/20  Maximiano Coss, NP    Family History Family History  Problem Relation Age of Onset  . Diabetes Mother   . Hyperlipidemia Mother   . Hypertension Mother   . Diabetes Father   . Cancer Sister 74       uterus?  . Diabetes Sister   . Hypertension Brother   . Diabetes Sister   . Diabetes Sister   . Diabetes Sister   . Diabetes Brother   . Hyperlipidemia Brother   . Hypertension Brother   . Colon cancer Neg Hx     Social History Social History   Tobacco Use  . Smoking status: Never Smoker  .  Smokeless tobacco: Never Used  Vaping Use  . Vaping Use: Never used  Substance Use Topics  . Alcohol use: No    Alcohol/week: 0.0 standard drinks    Comment: former alcohol use/ not currently  . Drug use: No     Allergies   Patient has no known allergies.   Review of Systems Review of Systems  Constitutional: Positive for appetite change. Negative for activity change, fatigue and fever.  Respiratory: Negative for cough and shortness of breath.   Cardiovascular: Negative for chest pain.  Gastrointestinal: Positive for nausea. Negative for abdominal pain, diarrhea and vomiting.  Genitourinary: Negative for dysuria, frequency, hematuria and urgency.  Musculoskeletal: Negative for arthralgias and myalgias.  Neurological: Negative for dizziness, light-headedness and headaches.     Physical Exam Triage Vital Signs ED Triage Vitals  Enc Vitals Group     BP 09/25/20 1941 (!) 164/76     Pulse Rate 09/25/20 1939 65     Resp 09/25/20 1939 20     Temp 09/25/20 1939 98.2 F (36.8 C)     Temp Source 09/25/20 1939 Oral     SpO2 09/25/20 1939 100 %     Weight --      Height --      Head Circumference --      Peak Flow --      Pain Score 09/25/20 1938 0     Pain Loc --      Pain Edu? --      Excl. in [place]? --    No data found.  Updated Vital Signs BP (!) 164/76   Pulse 65   Temp 98.2 F (36.8 C) (Oral)   Resp 20   SpO2 100%   Visual Acuity Right Eye Distance:   Left Eye Distance:   Bilateral Distance:    Right Eye Near:   Left Eye Near:    Bilateral Near:     Physical Exam Vitals reviewed.  Constitutional:      General: He is awake.     Appearance: Normal appearance. He is normal weight. He is not ill-appearing.     Comments: Very pleasant male appears stated age in no acute distress  HENT:     Head: Normocephalic and atraumatic.     Mouth/Throat:     Pharynx: No oropharyngeal exudate, posterior oropharyngeal erythema or uvula swelling.  Cardiovascular:      Rate and Rhythm: Normal rate and regular rhythm.     Heart sounds: No murmur heard.   Pulmonary:     Effort: Pulmonary effort is normal.     Breath sounds: Normal breath sounds. No stridor. No wheezing, rhonchi or rales.     Comments: Clear to auscultation bilaterally Abdominal:     General: Bowel sounds are  normal.     Palpations: Abdomen is soft.     Tenderness: There is no abdominal tenderness. There is no right CVA tenderness, left CVA tenderness, guarding or rebound.     Comments: Benign abdominal exam; no tenderness palpation.  Neurological:     Mental Status: He is alert.  Psychiatric:        Behavior: Behavior is cooperative.      UC Treatments / Results  Labs (all labs ordered are listed, but only abnormal results are displayed) Labs Reviewed - No data to display  EKG   Radiology No results found.  Procedures Procedures (including critical care time)  Medications Ordered in UC Medications - No data to display  Initial Impression / Assessment and Plan / UC Course  I have reviewed the triage vital signs and the nursing notes.  Pertinent labs & imaging results that were available during my care of the patient were reviewed by me and considered in my medical decision making (see chart for details).     Vital signs physical exam reassuring today; no indication for emergent evaluation or imaging.  Given patient is not vomiting labs were deferred.  Discussed that symptoms could be related to several different medications but could also be related to viral illness given known sick contacts.  We will treat symptomatically with Protonix and Zofran.  Recommended patient drink plenty of fluid and eat a bland diet.  Discussed that if symptoms worsen he needs to be reevaluated immediately.  Strict return precautions given to which patient expressed understanding.  Discussed that if symptoms persist despite treatment would need to see PCP to consider additional medication  adjustment.  Final Clinical Impressions(s) / UC Diagnoses   Final diagnoses:  Nausea     Discharge Instructions     Take pantoprazole on an empty stomach first thing in the morning separating from food and other medication by 30 minutes.  This will help coat your stomach in case acid reflux is contributing to your symptoms.  Use Zofran under the tongue up to 3 times a day as needed for nausea.  Eat a bland diet and drink plenty of fluid.  If you have any worsening symptoms please return.  Follow-up with PCP early next week if symptoms persist.    ED Prescriptions    Medication Sig Dispense Auth. Provider   pantoprazole (PROTONIX) 20 MG tablet Take 1 tablet (20 mg total) by mouth daily. 30 tablet Mathew Storck K, PA-C   ondansetron (ZOFRAN ODT) 4 MG disintegrating tablet Take 1 tablet (4 mg total) by mouth every 8 (eight) hours as needed for nausea or vomiting. 20 tablet Marjon Doxtater, Derry Skill, PA-C     PDMP not reviewed this encounter.   Terrilee Croak, PA-C 09/25/20 2012

## 2020-09-25 NOTE — ED Triage Notes (Addendum)
Pt in with c/o nausea x 1 week. Pt states he recently started new bp med and he has been nauseous ever since  Pt has not had medication for sxs  Denies any pain

## 2020-10-08 ENCOUNTER — Other Ambulatory Visit: Payer: Self-pay | Admitting: Internal Medicine

## 2020-10-08 ENCOUNTER — Other Ambulatory Visit (HOSPITAL_COMMUNITY): Payer: Self-pay

## 2020-10-08 MED ORDER — LOSARTAN POTASSIUM 100 MG PO TABS
1.0000 | ORAL_TABLET | Freq: Every day | ORAL | 5 refills | Status: DC
  Filled 2020-10-08 – 2020-12-08 (×2): qty 30, 30d supply, fill #0
  Filled 2021-01-09: qty 30, 30d supply, fill #1
  Filled 2021-02-10: qty 30, 30d supply, fill #2
  Filled 2021-02-12: qty 30, 30d supply, fill #0
  Filled 2021-03-17: qty 30, 30d supply, fill #1
  Filled 2021-04-14 – 2021-04-25 (×2): qty 30, 30d supply, fill #2
  Filled 2021-05-30: qty 30, 30d supply, fill #3

## 2020-10-08 MED ORDER — JANUVIA 100 MG PO TABS
ORAL_TABLET | Freq: Every day | ORAL | 5 refills | Status: DC
  Filled 2020-10-08 (×2): qty 30, 30d supply, fill #0
  Filled 2020-11-20: qty 30, 30d supply, fill #1
  Filled 2020-12-16: qty 30, 30d supply, fill #2
  Filled 2021-01-20: qty 30, 30d supply, fill #3
  Filled 2021-01-20: qty 30, 30d supply, fill #0
  Filled 2021-02-10 – 2021-02-21 (×2): qty 30, 30d supply, fill #1
  Filled 2021-03-17: qty 30, 30d supply, fill #2

## 2020-10-08 MED ORDER — PANTOPRAZOLE SODIUM 20 MG PO TBEC
20.0000 mg | DELAYED_RELEASE_TABLET | Freq: Every day | ORAL | 2 refills | Status: DC
  Filled 2020-10-08: qty 30, 30d supply, fill #0

## 2020-10-08 MED ORDER — AMLODIPINE BESYLATE 5 MG PO TABS
1.0000 | ORAL_TABLET | Freq: Every day | ORAL | 5 refills | Status: DC
  Filled 2020-10-08 (×2): qty 30, 30d supply, fill #0
  Filled 2020-11-07 – 2020-11-13 (×2): qty 30, 30d supply, fill #1
  Filled 2020-11-13: qty 30, 30d supply, fill #0
  Filled 2020-12-08: qty 30, 30d supply, fill #1
  Filled 2021-01-09: qty 30, 30d supply, fill #2
  Filled 2021-02-10: qty 30, 30d supply, fill #3
  Filled 2021-03-17: qty 30, 30d supply, fill #4

## 2020-10-08 MED ORDER — METOPROLOL SUCCINATE ER 25 MG PO TB24
1.0000 | ORAL_TABLET | Freq: Every day | ORAL | 5 refills | Status: DC
  Filled 2020-10-08 (×2): qty 30, 30d supply, fill #0
  Filled 2020-11-07 – 2020-11-13 (×2): qty 30, 30d supply, fill #1
  Filled 2020-11-13: qty 30, 30d supply, fill #0
  Filled 2020-12-08: qty 30, 30d supply, fill #1
  Filled 2021-01-09: qty 30, 30d supply, fill #2
  Filled 2021-02-10: qty 30, 30d supply, fill #3
  Filled 2021-03-17: qty 30, 30d supply, fill #4

## 2020-10-08 MED ORDER — SIMVASTATIN 20 MG PO TABS
ORAL_TABLET | ORAL | 5 refills | Status: DC
  Filled 2020-10-08: qty 30, 30d supply, fill #0

## 2020-10-08 MED ORDER — METFORMIN HCL 1000 MG PO TABS
ORAL_TABLET | ORAL | 5 refills | Status: DC
  Filled 2020-10-08 (×2): qty 60, 30d supply, fill #0
  Filled 2020-11-07: qty 60, 30d supply, fill #1
  Filled 2020-11-13: qty 60, 30d supply, fill #0
  Filled 2020-12-08: qty 60, 30d supply, fill #1
  Filled 2021-01-14: qty 60, 30d supply, fill #2
  Filled 2021-02-10: qty 60, 30d supply, fill #3
  Filled 2021-03-17: qty 60, 30d supply, fill #4

## 2020-10-08 MED FILL — Simvastatin Tab 20 MG: ORAL | 90 days supply | Qty: 90 | Fill #0 | Status: CN

## 2020-10-09 ENCOUNTER — Other Ambulatory Visit (HOSPITAL_COMMUNITY): Payer: Self-pay

## 2020-10-09 LAB — PSA: PSA: 0.82 ng/mL (ref <=4.00)

## 2020-10-09 LAB — VITAMIN D 25 HYDROXY (VIT D DEFICIENCY, FRACTURES): Vit D, 25-Hydroxy: 19 ng/mL — ABNORMAL LOW (ref 30–100)

## 2020-10-09 LAB — COMPLETE METABOLIC PANEL WITH GFR
AG Ratio: 1.6 (calc) (ref 1.0–2.5)
ALT: 17 U/L (ref 9–46)
AST: 15 U/L (ref 10–35)
Albumin: 4.3 g/dL (ref 3.6–5.1)
Alkaline phosphatase (APISO): 76 U/L (ref 35–144)
BUN: 15 mg/dL (ref 7–25)
CO2: 17 mmol/L — ABNORMAL LOW (ref 20–32)
Calcium: 9.5 mg/dL (ref 8.6–10.3)
Chloride: 100 mmol/L (ref 98–110)
Creat: 1.09 mg/dL (ref 0.70–1.25)
GFR, Est African American: 83 mL/min/{1.73_m2} (ref >=60)
GFR, Est Non African American: 72 mL/min/{1.73_m2} (ref >=60)
Globulin: 2.7 g/dL (calc) (ref 1.9–3.7)
Glucose, Bld: 331 mg/dL — ABNORMAL HIGH (ref 65–99)
Potassium: 4.3 mmol/L (ref 3.5–5.3)
Sodium: 134 mmol/L — ABNORMAL LOW (ref 135–146)
Total Bilirubin: 0.3 mg/dL (ref 0.2–1.2)
Total Protein: 7 g/dL (ref 6.1–8.1)

## 2020-10-09 LAB — CBC
HCT: 43.8 % (ref 38.5–50.0)
Hemoglobin: 14.4 g/dL (ref 13.2–17.1)
MCH: 26.6 pg — ABNORMAL LOW (ref 27.0–33.0)
MCHC: 32.9 g/dL (ref 32.0–36.0)
MCV: 80.8 fL (ref 80.0–100.0)
MPV: 13.6 fL — ABNORMAL HIGH (ref 7.5–12.5)
Platelets: 179 10*3/uL (ref 140–400)
RBC: 5.42 10*6/uL (ref 4.20–5.80)
RDW: 15 % (ref 11.0–15.0)
WBC: 5.9 10*3/uL (ref 3.8–10.8)

## 2020-10-09 LAB — LIPID PANEL
Cholesterol: 135 mg/dL (ref <200)
HDL: 45 mg/dL (ref >=40)
LDL Cholesterol (Calc): 63 mg/dL (calc)
Non-HDL Cholesterol (Calc): 90 mg/dL (calc) (ref <130)
Total CHOL/HDL Ratio: 3 (calc) (ref <5.0)
Triglycerides: 202 mg/dL — ABNORMAL HIGH (ref <150)

## 2020-10-09 LAB — TSH: TSH: 1.08 mIU/L (ref 0.40–4.50)

## 2020-10-17 ENCOUNTER — Other Ambulatory Visit (HOSPITAL_COMMUNITY): Payer: Self-pay

## 2020-10-25 ENCOUNTER — Other Ambulatory Visit (HOSPITAL_COMMUNITY): Payer: Self-pay

## 2020-11-01 ENCOUNTER — Other Ambulatory Visit (HOSPITAL_COMMUNITY): Payer: Self-pay

## 2020-11-01 MED FILL — Simvastatin Tab 20 MG: ORAL | 90 days supply | Qty: 90 | Fill #0 | Status: AC

## 2020-11-08 ENCOUNTER — Other Ambulatory Visit (HOSPITAL_COMMUNITY): Payer: Self-pay

## 2020-11-13 ENCOUNTER — Other Ambulatory Visit (HOSPITAL_COMMUNITY): Payer: Self-pay

## 2020-11-20 ENCOUNTER — Other Ambulatory Visit (HOSPITAL_COMMUNITY): Payer: Self-pay

## 2020-12-09 ENCOUNTER — Other Ambulatory Visit (HOSPITAL_COMMUNITY): Payer: Self-pay

## 2020-12-16 ENCOUNTER — Other Ambulatory Visit (HOSPITAL_COMMUNITY): Payer: Self-pay

## 2021-01-09 ENCOUNTER — Other Ambulatory Visit (HOSPITAL_COMMUNITY): Payer: Self-pay

## 2021-01-14 ENCOUNTER — Other Ambulatory Visit (HOSPITAL_COMMUNITY): Payer: Self-pay

## 2021-01-20 ENCOUNTER — Other Ambulatory Visit (HOSPITAL_COMMUNITY): Payer: Self-pay

## 2021-01-22 ENCOUNTER — Other Ambulatory Visit (HOSPITAL_COMMUNITY): Payer: Self-pay

## 2021-01-31 ENCOUNTER — Other Ambulatory Visit (HOSPITAL_COMMUNITY): Payer: Self-pay

## 2021-01-31 MED ORDER — JARDIANCE 10 MG PO TABS
10.0000 mg | ORAL_TABLET | ORAL | 6 refills | Status: AC
  Filled 2021-01-31: qty 30, 30d supply, fill #0

## 2021-01-31 MED ORDER — ESOMEPRAZOLE MAGNESIUM 40 MG PO CPDR
40.0000 mg | DELAYED_RELEASE_CAPSULE | ORAL | 0 refills | Status: DC
  Filled 2021-01-31: qty 90, 90d supply, fill #0

## 2021-01-31 MED ORDER — SIMVASTATIN 20 MG PO TABS
20.0000 mg | ORAL_TABLET | ORAL | 3 refills | Status: DC
  Filled 2021-01-31: qty 90, 90d supply, fill #0

## 2021-02-10 ENCOUNTER — Other Ambulatory Visit (HOSPITAL_COMMUNITY): Payer: Self-pay

## 2021-02-12 ENCOUNTER — Other Ambulatory Visit (HOSPITAL_COMMUNITY): Payer: Self-pay

## 2021-02-21 ENCOUNTER — Other Ambulatory Visit (HOSPITAL_COMMUNITY): Payer: Self-pay

## 2021-02-28 ENCOUNTER — Other Ambulatory Visit (HOSPITAL_COMMUNITY): Payer: Self-pay

## 2021-03-07 ENCOUNTER — Other Ambulatory Visit (HOSPITAL_COMMUNITY): Payer: Self-pay

## 2021-03-07 MED ORDER — METHOCARBAMOL 500 MG PO TABS
500.0000 mg | ORAL_TABLET | Freq: Four times a day (QID) | ORAL | 0 refills | Status: DC
  Filled 2021-03-07: qty 28, 7d supply, fill #0

## 2021-03-07 MED ORDER — MELOXICAM 7.5 MG PO TABS
7.5000 mg | ORAL_TABLET | Freq: Two times a day (BID) | ORAL | 0 refills | Status: DC
  Filled 2021-03-07: qty 42, 21d supply, fill #0

## 2021-03-17 ENCOUNTER — Other Ambulatory Visit (HOSPITAL_COMMUNITY): Payer: Self-pay

## 2021-04-14 ENCOUNTER — Other Ambulatory Visit (HOSPITAL_COMMUNITY): Payer: Self-pay

## 2021-04-14 MED ORDER — AMLODIPINE BESYLATE 5 MG PO TABS
5.0000 mg | ORAL_TABLET | Freq: Every day | ORAL | 2 refills | Status: DC
  Filled 2021-04-14: qty 30, 30d supply, fill #0
  Filled 2021-05-19: qty 30, 30d supply, fill #1
  Filled 2021-06-23: qty 30, 30d supply, fill #2

## 2021-04-14 MED ORDER — METOPROLOL SUCCINATE ER 25 MG PO TB24
25.0000 mg | ORAL_TABLET | Freq: Every day | ORAL | 2 refills | Status: DC
  Filled 2021-04-14: qty 30, 30d supply, fill #0

## 2021-04-14 MED ORDER — METFORMIN HCL 1000 MG PO TABS
1000.0000 mg | ORAL_TABLET | Freq: Two times a day (BID) | ORAL | 2 refills | Status: DC
  Filled 2021-04-14: qty 60, 30d supply, fill #0

## 2021-04-15 ENCOUNTER — Other Ambulatory Visit (HOSPITAL_COMMUNITY): Payer: Self-pay

## 2021-04-25 ENCOUNTER — Other Ambulatory Visit (HOSPITAL_COMMUNITY): Payer: Self-pay

## 2021-04-29 ENCOUNTER — Other Ambulatory Visit (HOSPITAL_COMMUNITY): Payer: Self-pay

## 2021-04-29 MED ORDER — METOPROLOL SUCCINATE ER 25 MG PO TB24
25.0000 mg | ORAL_TABLET | Freq: Every day | ORAL | 3 refills | Status: DC
  Filled 2021-04-29: qty 90, 90d supply, fill #0

## 2021-04-29 MED ORDER — SIMVASTATIN 20 MG PO TABS
20.0000 mg | ORAL_TABLET | Freq: Every evening | ORAL | 3 refills | Status: DC
  Filled 2021-04-29 – 2021-05-06 (×2): qty 90, 90d supply, fill #0
  Filled 2021-08-06 – 2021-08-08 (×2): qty 90, 90d supply, fill #1
  Filled 2021-10-30: qty 90, 90d supply, fill #2

## 2021-04-29 MED ORDER — AMLODIPINE BESYLATE 5 MG PO TABS
5.0000 mg | ORAL_TABLET | Freq: Every evening | ORAL | 3 refills | Status: DC
  Filled 2021-04-29 – 2021-07-25 (×2): qty 90, 90d supply, fill #0
  Filled 2021-11-18: qty 90, 90d supply, fill #1

## 2021-04-29 MED ORDER — JARDIANCE 25 MG PO TABS
25.0000 mg | ORAL_TABLET | Freq: Every day | ORAL | 3 refills | Status: DC
  Filled 2021-04-29 – 2022-01-30 (×2): qty 90, 90d supply, fill #0

## 2021-04-29 MED ORDER — METFORMIN HCL 1000 MG PO TABS
1000.0000 mg | ORAL_TABLET | Freq: Two times a day (BID) | ORAL | 3 refills | Status: DC
  Filled 2021-04-29 – 2021-05-07 (×3): qty 180, 90d supply, fill #0

## 2021-04-29 MED ORDER — JANUVIA 100 MG PO TABS
100.0000 mg | ORAL_TABLET | Freq: Every day | ORAL | 3 refills | Status: DC
  Filled 2021-04-29: qty 90, 90d supply, fill #0

## 2021-04-30 ENCOUNTER — Other Ambulatory Visit (HOSPITAL_COMMUNITY): Payer: Self-pay

## 2021-04-30 MED ORDER — GABAPENTIN 300 MG PO CAPS
300.0000 mg | ORAL_CAPSULE | Freq: Every day | ORAL | 1 refills | Status: DC
  Filled 2021-04-30: qty 90, 90d supply, fill #0

## 2021-05-06 ENCOUNTER — Other Ambulatory Visit (HOSPITAL_COMMUNITY): Payer: Self-pay

## 2021-05-07 ENCOUNTER — Other Ambulatory Visit (HOSPITAL_COMMUNITY): Payer: Self-pay

## 2021-05-12 ENCOUNTER — Other Ambulatory Visit (HOSPITAL_COMMUNITY): Payer: Self-pay

## 2021-05-13 ENCOUNTER — Other Ambulatory Visit (HOSPITAL_COMMUNITY): Payer: Self-pay

## 2021-05-13 MED ORDER — ESOMEPRAZOLE MAGNESIUM 40 MG PO CPDR
40.0000 mg | DELAYED_RELEASE_CAPSULE | Freq: Every day | ORAL | 4 refills | Status: DC
  Filled 2021-05-13: qty 90, 90d supply, fill #0
  Filled 2021-08-06: qty 90, 90d supply, fill #1

## 2021-05-15 ENCOUNTER — Other Ambulatory Visit (HOSPITAL_COMMUNITY): Payer: Self-pay

## 2021-05-16 ENCOUNTER — Other Ambulatory Visit (HOSPITAL_COMMUNITY): Payer: Self-pay

## 2021-05-19 ENCOUNTER — Other Ambulatory Visit (HOSPITAL_COMMUNITY): Payer: Self-pay

## 2021-05-30 ENCOUNTER — Other Ambulatory Visit (HOSPITAL_COMMUNITY): Payer: Self-pay

## 2021-06-23 ENCOUNTER — Other Ambulatory Visit (HOSPITAL_COMMUNITY): Payer: Self-pay

## 2021-06-23 MED ORDER — LOSARTAN POTASSIUM 100 MG PO TABS
100.0000 mg | ORAL_TABLET | Freq: Every day | ORAL | 5 refills | Status: DC
  Filled 2021-06-23: qty 30, 30d supply, fill #0
  Filled 2021-07-25: qty 30, 30d supply, fill #1
  Filled 2021-08-29: qty 30, 30d supply, fill #2

## 2021-07-25 ENCOUNTER — Other Ambulatory Visit (HOSPITAL_COMMUNITY): Payer: Self-pay

## 2021-07-28 ENCOUNTER — Other Ambulatory Visit (HOSPITAL_COMMUNITY): Payer: Self-pay

## 2021-07-28 MED ORDER — JARDIANCE 25 MG PO TABS
25.0000 mg | ORAL_TABLET | Freq: Every day | ORAL | 3 refills | Status: DC
  Filled 2021-07-28: qty 90, 90d supply, fill #0
  Filled 2021-10-30: qty 90, 90d supply, fill #1

## 2021-07-28 MED ORDER — JANUVIA 100 MG PO TABS
100.0000 mg | ORAL_TABLET | Freq: Every day | ORAL | 3 refills | Status: DC
Start: 2021-07-28 — End: 2022-11-02
  Filled 2021-07-28: qty 90, 90d supply, fill #0
  Filled 2021-10-30: qty 90, 90d supply, fill #1

## 2021-07-28 MED ORDER — GABAPENTIN 600 MG PO TABS
600.0000 mg | ORAL_TABLET | Freq: Every day | ORAL | 3 refills | Status: DC
Start: 2021-07-28 — End: 2022-02-19
  Filled 2021-07-28: qty 90, 90d supply, fill #0
  Filled 2021-10-16: qty 90, 90d supply, fill #1

## 2021-07-28 MED ORDER — ONETOUCH VERIO VI STRP
1.0000 | ORAL_STRIP | 6 refills | Status: DC
Start: 2021-07-28 — End: 2023-12-28
  Filled 2021-07-28: qty 100, 25d supply, fill #0

## 2021-07-28 MED ORDER — ONETOUCH DELICA PLUS LANCET33G MISC
6 refills | Status: DC
Start: 2021-07-28 — End: 2023-12-28
  Filled 2021-07-28 – 2021-12-29 (×2): qty 100, 25d supply, fill #0

## 2021-07-28 MED ORDER — AMLODIPINE BESYLATE 5 MG PO TABS
5.0000 mg | ORAL_TABLET | Freq: Every evening | ORAL | 3 refills | Status: DC
Start: 2021-07-28 — End: 2022-08-28
  Filled 2021-07-28 – 2022-01-30 (×2): qty 90, 90d supply, fill #0
  Filled 2022-05-26: qty 90, 90d supply, fill #1

## 2021-07-28 MED ORDER — SIMVASTATIN 20 MG PO TABS
20.0000 mg | ORAL_TABLET | Freq: Every evening | ORAL | 3 refills | Status: DC
  Filled 2021-07-28 – 2022-01-30 (×2): qty 90, 90d supply, fill #0
  Filled 2022-04-24: qty 90, 90d supply, fill #1

## 2021-07-28 MED ORDER — METOPROLOL SUCCINATE ER 25 MG PO TB24
25.0000 mg | ORAL_TABLET | Freq: Every day | ORAL | 3 refills | Status: DC
  Filled 2021-07-28: qty 90, 90d supply, fill #0
  Filled 2021-10-30: qty 90, 90d supply, fill #1
  Filled 2022-02-11: qty 90, 90d supply, fill #2
  Filled 2022-05-12: qty 90, 90d supply, fill #3

## 2021-07-28 MED ORDER — LOSARTAN POTASSIUM 100 MG PO TABS
100.0000 mg | ORAL_TABLET | Freq: Every day | ORAL | 3 refills | Status: DC
Start: 2021-07-28 — End: 2022-08-13
  Filled 2021-07-28 – 2022-01-30 (×2): qty 90, 90d supply, fill #0
  Filled 2022-05-12: qty 90, 90d supply, fill #1

## 2021-07-28 MED ORDER — METFORMIN HCL 1000 MG PO TABS
1000.0000 mg | ORAL_TABLET | Freq: Two times a day (BID) | ORAL | 3 refills | Status: DC
  Filled 2021-07-28: qty 180, 90d supply, fill #0
  Filled 2021-11-12: qty 180, 90d supply, fill #1
  Filled 2022-01-30: qty 180, 90d supply, fill #2
  Filled 2022-05-12: qty 180, 90d supply, fill #3

## 2021-07-29 ENCOUNTER — Other Ambulatory Visit (HOSPITAL_COMMUNITY): Payer: Self-pay

## 2021-08-07 ENCOUNTER — Other Ambulatory Visit (HOSPITAL_COMMUNITY): Payer: Self-pay

## 2021-08-08 ENCOUNTER — Other Ambulatory Visit (HOSPITAL_COMMUNITY): Payer: Self-pay

## 2021-08-08 MED ORDER — PANTOPRAZOLE SODIUM 40 MG PO TBEC
40.0000 mg | DELAYED_RELEASE_TABLET | Freq: Every day | ORAL | 1 refills | Status: DC
  Filled 2021-08-08: qty 90, 90d supply, fill #0
  Filled 2021-10-30: qty 90, 90d supply, fill #1

## 2021-08-29 ENCOUNTER — Other Ambulatory Visit (HOSPITAL_COMMUNITY): Payer: Self-pay

## 2021-09-08 ENCOUNTER — Other Ambulatory Visit (HOSPITAL_COMMUNITY): Payer: Self-pay

## 2021-09-10 ENCOUNTER — Other Ambulatory Visit (HOSPITAL_COMMUNITY): Payer: Self-pay

## 2021-09-10 MED ORDER — TRESIBA FLEXTOUCH 100 UNIT/ML ~~LOC~~ SOPN
20.0000 [IU] | PEN_INJECTOR | Freq: Every day | SUBCUTANEOUS | 5 refills | Status: AC
  Filled 2021-09-10 – 2021-09-25 (×2): qty 15, 25d supply, fill #0
  Filled 2021-12-05: qty 15, 25d supply, fill #1
  Filled 2022-02-19 – 2022-02-27 (×4): qty 15, 25d supply, fill #2

## 2021-09-10 MED ORDER — INSULIN PEN NEEDLE 32G X 4 MM MISC
3 refills | Status: DC
  Filled 2021-09-10: qty 100, 90d supply, fill #0
  Filled 2021-12-29: qty 100, 90d supply, fill #1

## 2021-09-11 ENCOUNTER — Other Ambulatory Visit (HOSPITAL_COMMUNITY): Payer: Self-pay

## 2021-09-25 ENCOUNTER — Other Ambulatory Visit (HOSPITAL_COMMUNITY): Payer: Self-pay

## 2021-10-16 ENCOUNTER — Other Ambulatory Visit (HOSPITAL_COMMUNITY): Payer: Self-pay

## 2021-10-30 ENCOUNTER — Other Ambulatory Visit (HOSPITAL_COMMUNITY): Payer: Self-pay

## 2021-11-12 ENCOUNTER — Other Ambulatory Visit (HOSPITAL_COMMUNITY): Payer: Self-pay

## 2021-11-18 ENCOUNTER — Other Ambulatory Visit (HOSPITAL_COMMUNITY): Payer: Self-pay

## 2021-12-05 ENCOUNTER — Other Ambulatory Visit (HOSPITAL_COMMUNITY): Payer: Self-pay

## 2021-12-11 ENCOUNTER — Other Ambulatory Visit (HOSPITAL_COMMUNITY): Payer: Self-pay

## 2021-12-12 ENCOUNTER — Other Ambulatory Visit (HOSPITAL_COMMUNITY): Payer: Self-pay

## 2021-12-12 MED ORDER — GABAPENTIN 600 MG PO TABS
600.0000 mg | ORAL_TABLET | Freq: Two times a day (BID) | ORAL | 2 refills | Status: DC
  Filled 2021-12-12 – 2021-12-16 (×2): qty 60, 30d supply, fill #0
  Filled 2022-01-14: qty 60, 30d supply, fill #1
  Filled 2022-02-19: qty 60, 30d supply, fill #2

## 2021-12-16 ENCOUNTER — Other Ambulatory Visit (HOSPITAL_COMMUNITY): Payer: Self-pay

## 2021-12-29 ENCOUNTER — Other Ambulatory Visit (HOSPITAL_COMMUNITY): Payer: Self-pay

## 2022-01-14 ENCOUNTER — Other Ambulatory Visit (HOSPITAL_COMMUNITY): Payer: Self-pay

## 2022-01-30 ENCOUNTER — Other Ambulatory Visit (HOSPITAL_COMMUNITY): Payer: Self-pay

## 2022-01-30 MED ORDER — PANTOPRAZOLE SODIUM 40 MG PO TBEC
40.0000 mg | DELAYED_RELEASE_TABLET | Freq: Every day | ORAL | 1 refills | Status: DC
  Filled 2022-01-30: qty 90, 90d supply, fill #0
  Filled 2022-04-24: qty 90, 90d supply, fill #1

## 2022-02-11 ENCOUNTER — Other Ambulatory Visit (HOSPITAL_COMMUNITY): Payer: Self-pay

## 2022-02-19 ENCOUNTER — Other Ambulatory Visit (HOSPITAL_COMMUNITY): Payer: Self-pay

## 2022-02-23 ENCOUNTER — Other Ambulatory Visit (HOSPITAL_COMMUNITY): Payer: Self-pay

## 2022-02-27 ENCOUNTER — Other Ambulatory Visit (HOSPITAL_COMMUNITY): Payer: Self-pay

## 2022-02-27 MED ORDER — INSULIN NPH (HUMAN) (ISOPHANE) 100 UNIT/ML ~~LOC~~ SUSP
20.0000 [IU] | Freq: Two times a day (BID) | SUBCUTANEOUS | 2 refills | Status: AC
  Filled 2022-02-27: qty 10, 25d supply, fill #0

## 2022-03-02 ENCOUNTER — Other Ambulatory Visit (HOSPITAL_COMMUNITY): Payer: Self-pay

## 2022-03-17 ENCOUNTER — Other Ambulatory Visit (HOSPITAL_COMMUNITY): Payer: Self-pay

## 2022-03-26 ENCOUNTER — Other Ambulatory Visit (HOSPITAL_COMMUNITY): Payer: Self-pay

## 2022-03-26 MED ORDER — ONETOUCH VERIO VI STRP
ORAL_STRIP | Freq: Four times a day (QID) | 6 refills | Status: DC
  Filled 2022-03-26 (×2): qty 100, 25d supply, fill #0

## 2022-03-26 MED ORDER — TECHLITE PEN NEEDLES 32G X 4 MM MISC
11 refills | Status: AC
  Filled 2022-03-26: qty 100, 90d supply, fill #0
  Filled 2022-07-16: qty 100, 90d supply, fill #1
  Filled 2022-12-01: qty 100, 90d supply, fill #2

## 2022-03-26 MED ORDER — FARXIGA 10 MG PO TABS
10.0000 mg | ORAL_TABLET | Freq: Every day | ORAL | 11 refills | Status: AC
  Filled 2022-03-26: qty 30, 30d supply, fill #0
  Filled 2022-05-12: qty 30, 30d supply, fill #1
  Filled 2022-06-12: qty 30, 30d supply, fill #2
  Filled 2022-07-20: qty 30, 30d supply, fill #3

## 2022-03-26 MED ORDER — ONETOUCH DELICA PLUS LANCET33G MISC
Freq: Four times a day (QID) | 11 refills | Status: DC
  Filled 2022-03-26: qty 100, 25d supply, fill #0

## 2022-03-26 MED ORDER — BASAGLAR KWIKPEN 100 UNIT/ML ~~LOC~~ SOPN
20.0000 [IU] | PEN_INJECTOR | Freq: Every day | SUBCUTANEOUS | 11 refills | Status: DC
  Filled 2022-03-26: qty 15, 75d supply, fill #0
  Filled 2022-06-12: qty 15, 75d supply, fill #1
  Filled 2022-08-18: qty 15, 75d supply, fill #2
  Filled 2022-10-14: qty 15, 75d supply, fill #3

## 2022-04-13 ENCOUNTER — Ambulatory Visit
Admission: EM | Admit: 2022-04-13 | Discharge: 2022-04-13 | Disposition: A | Discharge status: Home / Self Care | Payer: Self-pay | Attending: Urgent Care | Admitting: Urgent Care

## 2022-04-13 DIAGNOSIS — Z1152 Encounter for screening for COVID-19: Secondary | ICD-10-CM | POA: Insufficient documentation

## 2022-04-13 DIAGNOSIS — E119 Type 2 diabetes mellitus without complications: Secondary | ICD-10-CM

## 2022-04-13 DIAGNOSIS — Z794 Long term (current) use of insulin: Secondary | ICD-10-CM

## 2022-04-13 DIAGNOSIS — J309 Allergic rhinitis, unspecified: Secondary | ICD-10-CM

## 2022-04-13 DIAGNOSIS — J018 Other acute sinusitis: Secondary | ICD-10-CM

## 2022-04-13 MED ORDER — AMOXICILLIN 500 MG PO CAPS
500.0000 mg | ORAL_CAPSULE | Freq: Three times a day (TID) | ORAL | 0 refills | Status: DC
  Filled 2022-04-13: qty 21, 7d supply, fill #0

## 2022-04-13 NOTE — ED Provider Notes (Signed)
[facility]  Note:  This document was prepared using Systems analyst and may include unintentional dictation errors.  MRN: [id number] DOB: 1955-12-29  Subjective:   Ricardo Mills is a 66 y.o. male presenting for 1 month history of a bad smell in his nose.  He is also had a difficult time tolerating other smells like perfumes or cologne, general strong smells.  No change to his taste.  No sinus pain, sinus headaches, ear pain, fever, epistaxis.  No change to his medications.   No current facility-administered medications for this encounter.  Current Outpatient Medications:    amLODipine (NORVASC) 2.5 MG tablet, Take 1 tablet (2.5 mg total) by mouth daily., Disp: 90 tablet, Rfl: 0   amLODipine (NORVASC) 5 MG tablet, Take 1 tablet (5 mg total) by mouth daily., Disp: 30 tablet, Rfl: 2   amLODipine (NORVASC) 5 MG tablet, Take 1 tablet (5 mg total) by mouth every evening., Disp: 90 tablet, Rfl: 3   amLODipine (NORVASC) 5 MG tablet, Take 1 tablet (5 mg total) by mouth every evening., Disp: 90 tablet, Rfl: 3   blood glucose meter kit and supplies, Per insurance preference. Test cbgs three times a day. Dx E11.65, Z79.4, Disp: 1 each, Rfl: 11   dapagliflozin propanediol (FARXIGA) 10 MG TABS tablet, Take 1 tablet (10 mg total) by mouth daily., Disp: 30 tablet, Rfl: 11   empagliflozin (JARDIANCE) 10 MG TABS tablet, Take 1 tablet by mouth once daily, Disp: 30 tablet, Rfl: 6   empagliflozin (JARDIANCE) 25 MG TABS tablet, Take 1 tablet (25 mg total) by mouth daily., Disp: 90 tablet, Rfl: 3   empagliflozin (JARDIANCE) 25 MG TABS tablet, Take 1 tablet (25 mg total) by mouth daily., Disp: 90 tablet, Rfl: 3   esomeprazole (NEXIUM) 40 MG capsule, Take 1 capsule by mouth once daily, Disp: 90 capsule, Rfl: 4   gabapentin (NEURONTIN) 600 MG tablet, Take 1 tablet (600 mg total) by mouth 2 (two) times daily., Disp: 60 tablet, Rfl: 2   glucose blood (ONETOUCH VERIO) test  strip, use 1 strip to test blood sugars 2-4 times daily, Disp: 100 strip, Rfl: 6   glucose blood (ONETOUCH VERIO) test strip, Use 1 strip 2-4 (four) times daily., Disp: 100 strip, Rfl: 6   insulin degludec (TRESIBA FLEXTOUCH) 100 UNIT/ML FlexTouch Pen, Inject 20 Units into the skin daily (and increase as directed in clinic up to 60 units per day), Disp: 15 mL, Rfl: 5   Insulin Glargine (BASAGLAR KWIKPEN) 100 UNIT/ML, Inject 20 Units into the skin daily., Disp: 15 mL, Rfl: 11   insulin NPH Human (NOVOLIN N RELION) 100 UNIT/ML injection, Inject 0.2 mLs (20 Units total) into the skin 2 (two) times daily., Disp: 10 mL, Rfl: 2   Insulin Pen Needle (TECHLITE PEN NEEDLES) 32G X 4 MM MISC, Use as directed with insulin, Disp: 100 each, Rfl: 11   Insulin Pen Needle 32G X 4 MM MISC, Use as directed, Disp: 100 each, Rfl: 3   Lancets (ONETOUCH DELICA PLUS EMVVKP22E) MISC, use 1 lancet 2-4 times daily, Disp: 100 each, Rfl: 6   Lancets (ONETOUCH DELICA PLUS SLPNPY05R) MISC, Use to check blood glucose 4 (four) times daily., Disp: 100 each, Rfl: 11   losartan (COZAAR) 100 MG tablet, Take 1 tablet (100 mg total) by mouth daily., Disp: 30 tablet, Rfl: 5   losartan (COZAAR) 100 MG tablet, Take 1 tablet (100 mg total) by mouth daily., Disp: 90 tablet, Rfl: 3  meloxicam (MOBIC) 7.5 MG tablet, Take 1 tablet (7.5 mg total) by mouth 2 (two) times daily for 21 days., Disp: 42 tablet, Rfl: 0   metFORMIN (GLUCOPHAGE) 1000 MG tablet, TAKE 1 TABLET (1,000 MG TOTAL) BY MOUTH 2 (TWO) TIMES DAILY WITH A MEAL., Disp: 180 tablet, Rfl: 0   metFORMIN (GLUCOPHAGE) 1000 MG tablet, Take 1 tablet (1,000 mg total) by mouth 2 (two) times daily., Disp: 60 tablet, Rfl: 2   metFORMIN (GLUCOPHAGE) 1000 MG tablet, Take 1 tablet (1,000 mg total) by mouth 2 (two) times daily with a meal., Disp: 180 tablet, Rfl: 3   metFORMIN (GLUCOPHAGE) 1000 MG tablet, Take 1 tablet (1,000 mg total) by mouth 2 (two) times daily with a meal, Disp: 180 tablet, Rfl:  3   methocarbamol (ROBAXIN) 500 MG tablet, Take 1 tablet (500 mg total) by mouth 4 (four) times daily for 7 days., Disp: 28 tablet, Rfl: 0   metoprolol succinate (TOPROL-XL) 25 MG 24 hr tablet, TAKE 1 TABLET (25 MG TOTAL) BY MOUTH DAILY., Disp: 90 tablet, Rfl: 3   metoprolol succinate (TOPROL-XL) 25 MG 24 hr tablet, Take 1 tablet (25 mg total) by mouth daily., Disp: 30 tablet, Rfl: 2   metoprolol succinate (TOPROL-XL) 25 MG 24 hr tablet, Take 1 tablet (25 mg total) by mouth daily., Disp: 90 tablet, Rfl: 3   metoprolol succinate (TOPROL-XL) 25 MG 24 hr tablet, Take 1 tablet (25 mg total) by mouth daily., Disp: 90 tablet, Rfl: 3   ondansetron (ZOFRAN ODT) 4 MG disintegrating tablet, Take 1 tablet (4 mg total) by mouth every 8 (eight) hours as needed for nausea or vomiting., Disp: 20 tablet, Rfl: 0   pantoprazole (PROTONIX) 20 MG tablet, Take 1 tablet (20 mg total) by mouth daily., Disp: 30 tablet, Rfl: 0   pantoprazole (PROTONIX) 20 MG tablet, TAKE 1 TABLET BY MOUTH DAILY, Disp: 30 tablet, Rfl: 2   pantoprazole (PROTONIX) 40 MG tablet, Take 1 tablet (40 mg total) by mouth daily., Disp: 90 tablet, Rfl: 1   sildenafil (REVATIO) 20 MG tablet, Take 3 tabs by mouth once a day as needed., Disp: 30 tablet, Rfl: 11   simvastatin (ZOCOR) 20 MG tablet, TAKE 1 TABLET (20 MG TOTAL) BY MOUTH EVERY EVENING., Disp: 90 tablet, Rfl: 1   simvastatin (ZOCOR) 20 MG tablet, TAKE 1 TABLET BY MOUTH NIGHTLY AT BEDTIME, Disp: 30 tablet, Rfl: 5   simvastatin (ZOCOR) 20 MG tablet, Taek 1 tablet by mouth once daily in the evening, Disp: 90 tablet, Rfl: 3   simvastatin (ZOCOR) 20 MG tablet, Take 1 tablet (20 mg total) by mouth every evening., Disp: 90 tablet, Rfl: 3   simvastatin (ZOCOR) 20 MG tablet, Take 1 tablet (20 mg total) by mouth every evening., Disp: 90 tablet, Rfl: 3   sitaGLIPtin (JANUVIA) 100 MG tablet, Take 1 tablet (100 mg total) by mouth daily., Disp: 90 tablet, Rfl: 3   sitaGLIPtin (JANUVIA) 100 MG tablet, Take 1  tablet (100 mg total) by mouth daily., Disp: 90 tablet, Rfl: 3   No Known Allergies  Past Medical History:  Diagnosis Date   Diabetes mellitus without complication ([place])    Hyperlipidemia    Hypertension      Past Surgical History:  Procedure Laterality Date   CIRCUMCISION      Family History  Problem Relation Age of Onset   Diabetes Mother    Hyperlipidemia Mother    Hypertension Mother    Diabetes Father    Cancer Sister 13  uterus?   Diabetes Sister    Hypertension Brother    Diabetes Sister    Diabetes Sister    Diabetes Sister    Diabetes Brother    Hyperlipidemia Brother    Hypertension Brother    Colon cancer Neg Hx     Social History   Tobacco Use   Smoking status: Never   Smokeless tobacco: Never  Vaping Use   Vaping Use: Never used  Substance Use Topics   Alcohol use: No    Alcohol/week: 0.0 standard drinks of alcohol    Comment: former alcohol use/ not currently   Drug use: No    ROS   Objective:   Vitals: BP 132/79 (BP Location: Left Arm)   Pulse 63   Temp 97.8 F (36.6 C) (Oral)   Resp 16   SpO2 97%   Physical Exam Constitutional:      General: He is not in acute distress.    Appearance: Normal appearance. He is well-developed and normal weight. He is not ill-appearing, toxic-appearing or diaphoretic.  HENT:     Head: Normocephalic and atraumatic.     Right Ear: Tympanic membrane, ear canal and external ear normal. No drainage, swelling or tenderness. No middle ear effusion. There is no impacted cerumen. Tympanic membrane is not erythematous or bulging.     Left Ear: Tympanic membrane, ear canal and external ear normal. No drainage, swelling or tenderness.  No middle ear effusion. There is no impacted cerumen. Tympanic membrane is not erythematous or bulging.     Nose: Nose normal. No congestion or rhinorrhea.     Mouth/Throat:     Mouth: Mucous membranes are moist.     Pharynx: No oropharyngeal exudate or posterior  oropharyngeal erythema.  Eyes:     General: No scleral icterus.       Right eye: No discharge.        Left eye: No discharge.     Extraocular Movements: Extraocular movements intact.     Conjunctiva/sclera: Conjunctivae normal.  Cardiovascular:     Rate and Rhythm: Normal rate.  Pulmonary:     Effort: Pulmonary effort is normal.  Musculoskeletal:     Cervical back: Normal range of motion and neck supple. No rigidity. No muscular tenderness.  Neurological:     General: No focal deficit present.     Mental Status: He is alert and oriented to person, place, and time.     Cranial Nerves: No cranial nerve deficit.     Motor: No weakness.     Coordination: Coordination normal.     Gait: Gait normal.     Deep Tendon Reflexes: Reflexes normal.  Psychiatric:        Mood and Affect: Mood normal.        Behavior: Behavior normal.        Thought Content: Thought content normal.        Judgment: Judgment normal.     Assessment and Plan :   PDMP not reviewed this encounter.  1. Other acute sinusitis, recurrence not specified   2. Allergic rhinitis, unspecified seasonality, unspecified trigger   3. Type 2 diabetes mellitus treated with insulin ([place])     Recommended testing for COVID-19 for informational purposes as COVID does affect olfaction.  Also discussed differential and will cover for sinusitis with amoxicillin. Maintain allergic rhinitis medications. Counseled patient on potential for adverse effects with medications prescribed/recommended today, ER and return-to-clinic precautions discussed, patient verbalized understanding.    Jaynee Eagles, PA-C  04/14/22 0759

## 2022-04-13 NOTE — ED Triage Notes (Addendum)
Pt c/o change in olfactory sense-smells "a bad smell" x 1 month-denies nasal congestion-NAD-steady gait

## 2022-04-14 ENCOUNTER — Other Ambulatory Visit (HOSPITAL_COMMUNITY): Payer: Self-pay

## 2022-04-14 LAB — SARS CORONAVIRUS 2 (TAT 6-24 HRS): SARS Coronavirus 2: NEGATIVE

## 2022-04-24 ENCOUNTER — Other Ambulatory Visit (HOSPITAL_COMMUNITY): Payer: Self-pay

## 2022-04-27 ENCOUNTER — Other Ambulatory Visit (HOSPITAL_COMMUNITY): Payer: Self-pay

## 2022-04-27 MED ORDER — GABAPENTIN 600 MG PO TABS
600.0000 mg | ORAL_TABLET | Freq: Two times a day (BID) | ORAL | 3 refills | Status: DC
  Filled 2022-04-27: qty 180, 90d supply, fill #0
  Filled 2022-07-20: qty 180, 90d supply, fill #1
  Filled 2022-10-23: qty 180, 90d supply, fill #2
  Filled 2022-12-21 – 2022-12-22 (×2): qty 180, 90d supply, fill #3

## 2022-05-12 ENCOUNTER — Other Ambulatory Visit (HOSPITAL_COMMUNITY): Payer: Self-pay

## 2022-05-26 ENCOUNTER — Other Ambulatory Visit (HOSPITAL_COMMUNITY): Payer: Self-pay

## 2022-05-29 ENCOUNTER — Other Ambulatory Visit (HOSPITAL_COMMUNITY): Payer: Self-pay

## 2022-06-12 ENCOUNTER — Other Ambulatory Visit (HOSPITAL_COMMUNITY): Payer: Self-pay

## 2022-07-16 ENCOUNTER — Other Ambulatory Visit (HOSPITAL_COMMUNITY): Payer: Self-pay

## 2022-07-17 ENCOUNTER — Other Ambulatory Visit (HOSPITAL_COMMUNITY): Payer: Self-pay

## 2022-07-20 ENCOUNTER — Other Ambulatory Visit (HOSPITAL_COMMUNITY): Payer: Self-pay

## 2022-07-21 ENCOUNTER — Other Ambulatory Visit (HOSPITAL_COMMUNITY): Payer: Self-pay

## 2022-07-21 ENCOUNTER — Other Ambulatory Visit: Payer: Self-pay

## 2022-07-21 MED ORDER — PANTOPRAZOLE SODIUM 40 MG PO TBEC
40.0000 mg | DELAYED_RELEASE_TABLET | Freq: Every day | ORAL | 3 refills | Status: DC
  Filled 2022-07-21: qty 90, 90d supply, fill #0
  Filled 2022-10-23: qty 90, 90d supply, fill #1

## 2022-07-22 ENCOUNTER — Other Ambulatory Visit (HOSPITAL_COMMUNITY): Payer: Self-pay

## 2022-07-27 ENCOUNTER — Other Ambulatory Visit (HOSPITAL_COMMUNITY): Payer: Self-pay

## 2022-07-31 ENCOUNTER — Other Ambulatory Visit (HOSPITAL_COMMUNITY): Payer: Self-pay

## 2022-08-04 ENCOUNTER — Other Ambulatory Visit (HOSPITAL_COMMUNITY): Payer: Self-pay

## 2022-08-04 MED ORDER — SIMVASTATIN 20 MG PO TABS
20.0000 mg | ORAL_TABLET | Freq: Every day | ORAL | 1 refills | Status: AC
  Filled 2022-08-04: qty 90, 90d supply, fill #0
  Filled 2022-11-04: qty 90, 90d supply, fill #1

## 2022-08-13 ENCOUNTER — Other Ambulatory Visit (HOSPITAL_COMMUNITY): Payer: Self-pay

## 2022-08-13 MED ORDER — LOSARTAN POTASSIUM 100 MG PO TABS
100.0000 mg | ORAL_TABLET | Freq: Every day | ORAL | 1 refills | Status: DC
  Filled 2022-08-13 – 2022-08-17 (×2): qty 30, 30d supply, fill #0

## 2022-08-17 ENCOUNTER — Other Ambulatory Visit (HOSPITAL_COMMUNITY): Payer: Self-pay

## 2022-08-18 ENCOUNTER — Other Ambulatory Visit (HOSPITAL_COMMUNITY): Payer: Self-pay

## 2022-08-18 ENCOUNTER — Other Ambulatory Visit: Payer: Self-pay

## 2022-08-24 ENCOUNTER — Other Ambulatory Visit (HOSPITAL_COMMUNITY): Payer: Self-pay

## 2022-08-27 ENCOUNTER — Other Ambulatory Visit (HOSPITAL_COMMUNITY): Payer: Self-pay

## 2022-08-28 ENCOUNTER — Other Ambulatory Visit (HOSPITAL_COMMUNITY): Payer: Self-pay

## 2022-08-28 MED ORDER — AMLODIPINE BESYLATE 5 MG PO TABS
5.0000 mg | ORAL_TABLET | Freq: Every evening | ORAL | 0 refills | Status: DC
  Filled 2022-08-28: qty 90, 90d supply, fill #0

## 2022-08-28 MED ORDER — METOPROLOL SUCCINATE ER 25 MG PO TB24
25.0000 mg | ORAL_TABLET | Freq: Every day | ORAL | 0 refills | Status: DC
  Filled 2022-08-28: qty 90, 90d supply, fill #0

## 2022-09-01 ENCOUNTER — Other Ambulatory Visit (HOSPITAL_COMMUNITY): Payer: Self-pay

## 2022-09-01 MED ORDER — METOPROLOL SUCCINATE ER 25 MG PO TB24
25.0000 mg | ORAL_TABLET | Freq: Every day | ORAL | 3 refills | Status: DC
  Filled 2022-09-01: qty 90, 90d supply, fill #0

## 2022-09-01 MED ORDER — LOSARTAN POTASSIUM 100 MG PO TABS
100.0000 mg | ORAL_TABLET | Freq: Every day | ORAL | 3 refills | Status: DC
  Filled 2022-09-01: qty 90, 90d supply, fill #0
  Filled 2023-01-08: qty 90, 90d supply, fill #1
  Filled 2023-04-19: qty 90, 90d supply, fill #2
  Filled 2023-07-19: qty 90, 90d supply, fill #3

## 2022-09-01 MED ORDER — AMLODIPINE BESYLATE 5 MG PO TABS
5.0000 mg | ORAL_TABLET | Freq: Every evening | ORAL | 3 refills | Status: DC
  Filled 2022-09-01: qty 90, 90d supply, fill #0

## 2022-10-14 ENCOUNTER — Other Ambulatory Visit (HOSPITAL_COMMUNITY): Payer: Self-pay

## 2022-10-15 ENCOUNTER — Other Ambulatory Visit (HOSPITAL_COMMUNITY): Payer: Self-pay

## 2022-10-15 MED ORDER — METFORMIN HCL 1000 MG PO TABS
1000.0000 mg | ORAL_TABLET | Freq: Two times a day (BID) | ORAL | 3 refills | Status: DC
  Filled 2022-10-15: qty 180, 90d supply, fill #0
  Filled 2023-01-08: qty 180, 90d supply, fill #1

## 2022-10-15 MED ORDER — BASAGLAR KWIKPEN 100 UNIT/ML ~~LOC~~ SOPN
28.0000 [IU] | PEN_INJECTOR | Freq: Every day | SUBCUTANEOUS | 11 refills | Status: AC
  Filled 2022-10-15: qty 15, 53d supply, fill #0
  Filled 2022-12-23 – 2022-12-28 (×4): qty 15, 53d supply, fill #1

## 2022-10-16 ENCOUNTER — Other Ambulatory Visit (HOSPITAL_COMMUNITY): Payer: Self-pay

## 2022-10-23 ENCOUNTER — Other Ambulatory Visit (HOSPITAL_COMMUNITY): Payer: Self-pay

## 2022-10-29 DIAGNOSIS — Z136 Encounter for screening for cardiovascular disorders: Secondary | ICD-10-CM | POA: Diagnosis not present

## 2022-10-29 DIAGNOSIS — E1140 Type 2 diabetes mellitus with diabetic neuropathy, unspecified: Secondary | ICD-10-CM | POA: Diagnosis not present

## 2022-10-29 DIAGNOSIS — Z1159 Encounter for screening for other viral diseases: Secondary | ICD-10-CM | POA: Diagnosis not present

## 2022-10-29 DIAGNOSIS — E663 Overweight: Secondary | ICD-10-CM | POA: Diagnosis not present

## 2022-10-29 DIAGNOSIS — E785 Hyperlipidemia, unspecified: Secondary | ICD-10-CM | POA: Diagnosis not present

## 2022-10-29 DIAGNOSIS — Z125 Encounter for screening for malignant neoplasm of prostate: Secondary | ICD-10-CM | POA: Diagnosis not present

## 2022-10-29 DIAGNOSIS — Z79899 Other long term (current) drug therapy: Secondary | ICD-10-CM | POA: Diagnosis not present

## 2022-10-29 DIAGNOSIS — I10 Essential (primary) hypertension: Secondary | ICD-10-CM | POA: Diagnosis not present

## 2022-10-29 DIAGNOSIS — E1169 Type 2 diabetes mellitus with other specified complication: Secondary | ICD-10-CM | POA: Diagnosis not present

## 2022-11-02 ENCOUNTER — Encounter (HOSPITAL_COMMUNITY): Payer: Self-pay | Admitting: Emergency Medicine

## 2022-11-02 ENCOUNTER — Other Ambulatory Visit: Payer: Self-pay

## 2022-11-02 ENCOUNTER — Ambulatory Visit (HOSPITAL_COMMUNITY)
Admission: EM | Admit: 2022-11-02 | Discharge: 2022-11-02 | Disposition: A | Discharge status: Home / Self Care | Payer: Medicare HMO | Attending: Emergency Medicine | Admitting: Emergency Medicine

## 2022-11-02 DIAGNOSIS — M5431 Sciatica, right side: Secondary | ICD-10-CM

## 2022-11-02 LAB — POCT FASTING CBG KUC MANUAL ENTRY: POCT Glucose (KUC): 215 mg/dL — AB (ref 70–99)

## 2022-11-02 MED ORDER — DEXAMETHASONE SODIUM PHOSPHATE 10 MG/ML IJ SOLN
INTRAMUSCULAR | Status: AC
  Filled 2022-11-02: qty 1

## 2022-11-02 MED ORDER — DEXAMETHASONE SODIUM PHOSPHATE 10 MG/ML IJ SOLN
10.0000 mg | Freq: Once | INTRAMUSCULAR | Status: AC
  Administered 2022-11-02: 10 mg via INTRAMUSCULAR

## 2022-11-02 NOTE — Discharge Instructions (Addendum)
The steroid injection today should provide good relief of pain and inflammation.  It can take about 30 minutes to start working.  Please monitor your blood sugars today and tomorrow as it can raise them higher.  I recommend to continue Tylenol at home, but also use ibuprofen every 6 hours for the next several days. This will help with pain and inflammation as well  Continue your gabapentin 600 mg twice daily  Please call your primary care provider to make an appointment for follow-up

## 2022-11-02 NOTE — ED Triage Notes (Signed)
Pain in right buttocks and radiating down right leg .  Pain started last Thursday.  Has taken tylenol , but not going away.  Pain is keeping him up at night.  No known injury or any change in typical physical behavior.

## 2022-11-02 NOTE — ED Provider Notes (Signed)
[facility]    CSN: [id number] Arrival date & time: 11/02/22  1001     History   Chief Complaint Chief Complaint  Patient presents with   Leg Pain    HPI Ricardo Mills is a 67 y.o. male.  4 day history of right buttock pain that radiates into proximal right posterior leg. No back pain. No injury or trauma. 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   Denies history of this   Past Medical History:  Diagnosis Date   Diabetes mellitus without complication (HCC)    Hyperlipidemia    Hypertension     Patient Active Problem List   Diagnosis Date Noted   Halitosis 03/28/2012   Diabetes (HCC) 06/21/2011   HTN (hypertension) 06/21/2011   Hyperlipidemia LDL goal <70 06/21/2011   ED (erectile dysfunction) 06/21/2011   Diabetes mellitus type 2, uncomplicated (HCC) 02/15/2011    Past Surgical History:  Procedure Laterality Date   CIRCUMCISION         Home Medications    Prior to Admission medications   Medication Sig Start Date End Date Taking? Authorizing Provider  amLODipine (NORVASC) 2.5 MG tablet Take 1 tablet (2.5 mg total) by mouth daily. 09/11/20   Wallis Bamberg, PA-C  amLODipine (NORVASC) 5 MG tablet Take 1 tablet (5 mg total) by mouth daily. 04/15/21     amLODipine (NORVASC) 5 MG tablet Take 1 tablet (5 mg total) by mouth every evening. 04/29/21     amLODipine (NORVASC) 5 MG tablet Take 1 tablet (5 mg total) by mouth every evening. 08/28/22     amLODipine (NORVASC) 5 MG tablet Take 1 tablet (5 mg total) by mouth every evening. 09/01/22     blood glucose meter kit and supplies Per insurance preference. Test cbgs three times a day. Dx E11.65, Z79.4 09/09/18   Lezlie Lye, Meda Coffee, MD  dapagliflozin propanediol (FARXIGA) 10 MG TABS tablet Take 1 tablet (10 mg total) by mouth daily. 03/26/22      empagliflozin (JARDIANCE) 10 MG TABS tablet Take 1 tablet by mouth once daily 01/31/21     gabapentin (NEURONTIN) 600 MG tablet Take 1 tablet (600 mg total) by mouth 2 (two) times daily. 04/26/22     glucose blood (ONETOUCH VERIO) test strip use 1 strip to test blood sugars 2-4 times daily 07/28/21     glucose blood (ONETOUCH VERIO) test strip Use 1 strip 2-4 (four) times daily. 03/26/22     insulin degludec (TRESIBA FLEXTOUCH) 100 UNIT/ML FlexTouch Pen Inject 20 Units into the skin daily (and increase as directed in clinic up to 60 units per day) 09/10/21     Insulin Glargine (BASAGLAR KWIKPEN) 100 UNIT/ML Inject 28 Units into the skin daily. 10/15/22     insulin NPH Human (NOVOLIN N RELION) 100 UNIT/ML injection Inject 0.2 mLs (20 Units total) into the skin 2 (two) times daily. 02/27/22     Insulin Pen Needle (TECHLITE PEN NEEDLES) 32G X 4 MM MISC Use as directed with insulin 03/26/22     Insulin Pen Needle 32G X 4 MM MISC Use as directed 09/10/21     Lancets (ONETOUCH DELICA PLUS LANCET33G) MISC use 1 lancet 2-4 times daily 07/28/21   Alysia Penna, MD  Lancets [facility] DELICA PLUS [place]) MISC Use to check blood glucose 4 (  four) times daily. 03/26/22     losartan (COZAAR) 100 MG tablet Take 1 tablet (100 mg total) by mouth daily. 06/23/21     losartan (COZAAR) 100 MG tablet Take 1 tablet (100 mg total) by mouth daily. 08/13/22     losartan (COZAAR) 100 MG tablet Take 1 tablet (100 mg total) by mouth daily. 09/01/22     metFORMIN (GLUCOPHAGE) 1000 MG tablet TAKE 1 TABLET (1,000 MG TOTAL) BY MOUTH 2 (TWO) TIMES DAILY WITH A MEAL. 05/27/20 05/27/21  [name], NP  metFORMIN (GLUCOPHAGE) 1000 MG tablet Take 1 tablet (1,000 mg total) by mouth 2 (two) times daily. 04/15/21     metFORMIN (GLUCOPHAGE) 1000 MG tablet Take 1 tablet (1,000 mg total) by mouth 2 (two) times daily with a meal. 04/29/21     metFORMIN (GLUCOPHAGE) 1000 MG tablet Take 1 tablet (1,000 mg total) by mouth 2 (two) times daily. 10/15/22      metoprolol succinate (TOPROL-XL) 25 MG 24 hr tablet TAKE 1 TABLET (25 MG TOTAL) BY MOUTH DAILY. 05/27/20 05/27/21  [name], NP  metoprolol succinate (TOPROL-XL) 25 MG 24 hr tablet Take 1 tablet (25 mg total) by mouth daily. 04/15/21     metoprolol succinate (TOPROL-XL) 25 MG 24 hr tablet Take 1 tablet (25 mg total) by mouth daily. 04/29/21     metoprolol succinate (TOPROL-XL) 25 MG 24 hr tablet Take 1 tablet (25 mg total) by mouth daily. 08/28/22     metoprolol succinate (TOPROL-XL) 25 MG 24 hr tablet Take 1 tablet (25 mg total) by mouth daily. 09/01/22     ondansetron (ZOFRAN ODT) 4 MG disintegrating tablet Take 1 tablet (4 mg total) by mouth every 8 (eight) hours as needed for nausea or vomiting. 09/25/20   Raspet, Denny Peon K, PA-C  pantoprazole (PROTONIX) 20 MG tablet Take 1 tablet (20 mg total) by mouth daily. 09/25/20   Raspet, Erin K, PA-C  pantoprazole (PROTONIX) 20 MG tablet TAKE 1 TABLET BY MOUTH DAILY 10/08/20     pantoprazole (PROTONIX) 40 MG tablet Take 1 tablet (40 mg total) by mouth daily. 07/21/22     sildenafil (REVATIO) 20 MG tablet Take 3 tabs by mouth once a day as needed. 05/26/19   Lezlie Lye, Meda Coffee, MD  simvastatin (ZOCOR) 20 MG tablet TAKE 1 TABLET (20 MG TOTAL) BY MOUTH EVERY EVENING. 05/27/20 05/27/21  [name], NP  simvastatin (ZOCOR) 20 MG tablet TAKE 1 TABLET BY MOUTH NIGHTLY AT BEDTIME 10/08/20     simvastatin (ZOCOR) 20 MG tablet Taek 1 tablet by mouth once daily in the evening 01/31/21     simvastatin (ZOCOR) 20 MG tablet Take 1 tablet (20 mg total) by mouth every evening. 04/29/21     simvastatin (ZOCOR) 20 MG tablet Take 1 tablet (20 mg total) by mouth daily, in the evening. 08/03/22       Family History Family History  Problem Relation Age of Onset   Diabetes Mother    Hyperlipidemia Mother    Hypertension Mother    Diabetes Father    Cancer Sister 58       uterus?   Diabetes Sister    Hypertension Brother    Diabetes Sister    Diabetes Sister     Diabetes Sister    Diabetes Brother    Hyperlipidemia Brother    Hypertension Brother    Colon cancer Neg Hx     Social History Social History   Tobacco Use   Smoking status: Never  Smokeless tobacco: Never  Vaping Use   Vaping Use: Never used  Substance Use Topics   Alcohol use: No    Alcohol/week: 0.0 standard drinks of alcohol    Comment: former alcohol use/ not currently   Drug use: No     Allergies   Patient has no known allergies.   Review of Systems Review of Systems As per HPI  Physical Exam Triage Vital Signs ED Triage Vitals  Enc Vitals Group     BP 11/02/22 1150 (!) 143/85     Pulse Rate 11/02/22 1150 62     Resp 11/02/22 1150 18     Temp 11/02/22 1150 98.1 F (36.7 C)     Temp Source 11/02/22 1150 Oral     SpO2 11/02/22 1150 96 %     Weight --      Height --      Head Circumference --      Peak Flow --      Pain Score 11/02/22 1145 10     Pain Loc --      Pain Edu? --      Excl. in GC? --    No data found.  Updated Vital Signs BP (!) 143/85 (BP Location: Left Arm)   Pulse 62   Temp 98.1 F (36.7 C) (Oral)   Resp 18   SpO2 96%   Physical Exam Vitals and nursing note reviewed.  Constitutional:      General: He is not in acute distress.    Appearance: He is not ill-appearing.  HENT:     Mouth/Throat:     Mouth: Mucous membranes are moist.     Pharynx: Oropharynx is clear.  Eyes:     Conjunctiva/sclera: Conjunctivae normal.     Pupils: Pupils are equal, round, and reactive to light.  Cardiovascular:     Rate and Rhythm: Normal rate and regular rhythm.     Heart sounds: Normal heart sounds.  Pulmonary:     Effort: Pulmonary effort is normal.     Breath sounds: Normal breath sounds.  Musculoskeletal:        General: No swelling, tenderness or deformity. Normal range of motion.     Cervical back: Normal range of motion. No rigidity or tenderness.     Lumbar back: Negative left straight leg raise test.     Comments: No bony or  muscular tenderness of the back. Full ROM of extremities   Skin:    General: Skin is warm and dry.  Neurological:     General: No focal deficit present.     Mental Status: He is alert and oriented to person, place, and time.     Cranial Nerves: No cranial nerve deficit.     Sensory: Sensation is intact.     Motor: Motor function is intact. No weakness.     Coordination: Coordination is intact.     Gait: Gait is intact.     Comments: Distal sensation intact throughout.  Strength 5/5. Strong radial and DP pulse. Cap refill < 2 seconds       UC Treatments / Results  Labs (all labs ordered are listed, but only abnormal results are displayed) Labs Reviewed  POCT FASTING CBG [facility] MANUAL ENTRY - Abnormal; Notable for the following components:      Result Value   POCT Glucose ([facility]) 215 (*)    All other components within normal limits    EKG  Radiology No results found.  Procedures Procedures (including critical  care time)  Medications Ordered in UC Medications  dexamethasone (DECADRON) injection 10 mg (10 mg Intramuscular Given 11/02/22 1254)    Initial Impression / Assessment and Plan / UC Course  I have reviewed the triage vital signs and the nursing notes.  Pertinent labs & imaging results that were available during my care of the patient were reviewed by me and considered in my medical decision making (see chart for details).  CBG 215  IM steroid given for inflammation. Advised to monitor blood sugars for a few days, continue DM meds as prescribed. Ibuprofen and/or tylenol at home for pain relief. Gentle stretching, light massage, alternate between sitting/standing as much as possible. Takes gabapentin BID, will continue. Recommend follow with ortho or primary if persisting. Patient agrees to plan. Return precautions dicussed  Final Clinical Impressions(s) / UC Diagnoses   Final diagnoses:  Sciatica of right side     Discharge Instructions      The steroid  injection today should provide good relief of pain and inflammation.  It can take about 30 minutes to start working.  Please monitor your blood sugars today and tomorrow as it can raise them higher.  I recommend to continue Tylenol at home, but also use ibuprofen every 6 hours for the next several days. This will help with pain and inflammation as well  Continue your gabapentin 600 mg twice daily  Please call your primary care provider to make an appointment for follow-up     ED Prescriptions   None    PDMP not reviewed this encounter.   Raedyn Klinck, Lurena Joiner, [place] 11/02/22 1405

## 2022-11-04 ENCOUNTER — Encounter (HOSPITAL_COMMUNITY): Payer: Self-pay

## 2022-11-04 ENCOUNTER — Ambulatory Visit (INDEPENDENT_AMBULATORY_CARE_PROVIDER_SITE_OTHER): Payer: Medicare HMO

## 2022-11-04 ENCOUNTER — Ambulatory Visit (HOSPITAL_COMMUNITY)
Admission: EM | Admit: 2022-11-04 | Discharge: 2022-11-04 | Disposition: A | Discharge status: Home / Self Care | Payer: Medicare HMO | Attending: Internal Medicine | Admitting: Internal Medicine

## 2022-11-04 ENCOUNTER — Other Ambulatory Visit (HOSPITAL_COMMUNITY): Payer: Self-pay

## 2022-11-04 DIAGNOSIS — M5431 Sciatica, right side: Secondary | ICD-10-CM

## 2022-11-04 DIAGNOSIS — M25551 Pain in right hip: Secondary | ICD-10-CM | POA: Diagnosis not present

## 2022-11-04 DIAGNOSIS — M47816 Spondylosis without myelopathy or radiculopathy, lumbar region: Secondary | ICD-10-CM | POA: Diagnosis not present

## 2022-11-04 DIAGNOSIS — M25559 Pain in unspecified hip: Secondary | ICD-10-CM | POA: Diagnosis not present

## 2022-11-04 DIAGNOSIS — M549 Dorsalgia, unspecified: Secondary | ICD-10-CM | POA: Diagnosis not present

## 2022-11-04 MED ORDER — PREDNISONE 10 MG (21) PO TBPK
ORAL_TABLET | ORAL | 0 refills | Status: DC
  Filled 2022-11-04: qty 21, 6d supply, fill #0

## 2022-11-04 NOTE — ED Triage Notes (Signed)
Pt states seen and tx'd here on Monday for rt buttock/hip pain radiating down rt leg. States was given a shot that lasted a day or so but now pain is worse. Denies injury but states did lift a heavy dresser last week prior to this.

## 2022-11-04 NOTE — ED Provider Notes (Signed)
[facility]   [id number] 11/04/22 Arrival Time: 9562  ASSESSMENT & PLAN:  1. Sciatica of right side    -Clinical presentation is consistent with sciatica.  Will start him on a prednisone Dosepak.  Encouraged heat, stretching, staying active.  Continues gabapentin.  All questions answered and agrees to plan.  Meds ordered this encounter  Medications   predniSONE (STERAPRED UNI-PAK 21 TAB) 10 MG (21) TBPK tablet    Sig: Take as directed on packaging    Dispense:  21 tablet    Refill:  0     Discharge Instructions      Take prednisone as directed on packaging       Follow-up Information     Chadwell, Ivin Booty, PA-C.   Specialty: Orthopedic Surgery Why: If symptoms worsen Contact information: [address] [place] [address] [phone]                  Reviewed expectations re: course of current medical issues. Questions answered. Outlined signs and symptoms indicating need for more acute intervention. Patient verbalized understanding. After Visit Summary given.   SUBJECTIVE: Pleasant 67 year old male comes to clinic to be evaluated for right radicular leg pain.  Started about a week ago.  He was seen at the urgent care 2 days ago.  He got a shot of Toradol but it wore off after couple hours.  Today he still reporting constant sharp shooting pain down the back of the right leg.  No numbness or tingling.  No inciting injury or trauma.  No weakness in the leg, loss of bladder control, or numbness in the groin. Has tried gabapentin BID.   No LMP for male patient. Past Surgical History:  Procedure Laterality Date   CIRCUMCISION       OBJECTIVE:  Vitals:   11/04/22 1053  BP: (!) 155/80  Pulse: 66  Resp: 18  Temp: 98 F (36.7 C)  TempSrc: Oral  SpO2: 96%     Physical Exam Vitals and nursing note reviewed.  Constitutional:      Appearance: Normal appearance.  Cardiovascular:     Rate and Rhythm: Normal rate.   Pulmonary:     Effort: Pulmonary effort is normal.  Musculoskeletal:     Comments: L Spine - no obvious deformity.  Nontender over lumbar spinous processes, no bony step-off.  No overlying skin changes.  Nontender palpation at the lumbar paraspinal musculature.  No pain with passive hip flexion.  5/5 strength with resisted hip flexion and knee extension.  Positive slump test on the right.  Negative FADIR and FABER.      Labs: Results for orders placed or performed during the hospital encounter of 11/02/22  POC CBG monitoring  Result Value Ref Range   POCT Glucose ([facility]) 215 (A) 70 - 99 mg/dL   Labs Reviewed - No data to display  Imaging: DG Lumbar Spine Complete  Result Date: 11/04/2022 CLINICAL DATA:  Back and hip pain EXAM: LUMBAR SPINE - COMPLETE 4+ VIEW COMPARISON:  08/21/2013 FINDINGS: Normal alignment acute osseous finding or fracture. Preserved vertebral body heights. Minimal anterior endplate bony spurring at all levels noted. Preserved disc spaces. No pars defects. Normal pedicles and SI joints. Nonobstructive bowel gas pattern. IMPRESSION: Minor degenerative changes. No acute finding by plain radiography. Electronically Signed   By: Judie Petit.  Shick M.D.   On: 11/04/2022 11:21   DG Hip Unilat With Pelvis 2-3 Views Right  Result Date: 11/04/2022 CLINICAL DATA:  Right hip pain  EXAM: DG HIP (WITH OR WITHOUT PELVIS) 2-3V RIGHT COMPARISON:  06/14/2010 FINDINGS: Intact right hip without acute osseous finding, fracture or malalignment. Symmetric preserved joint spaces. No significant arthropathy. Bony pelvis intact. Normal SI joints for age. No diastasis. Nonobstructive bowel gas pattern. IMPRESSION: No acute finding by plain radiography. Electronically Signed   By: Judie Petit.  Shick M.D.   On: 11/04/2022 11:20     No Known Allergies                                             Past Medical History:  Diagnosis Date   Diabetes mellitus without complication (HCC)    Hyperlipidemia    Hypertension      Social History   Socioeconomic History   Marital status: Married    Spouse name: [place]   Number of children: 4   Years of education: 12   Highest education level: Not on file  Occupational History   Occupation: Technical sales engineer in boxes    Comment: [employer]-13 hours/day, 3 days/week  Tobacco Use   Smoking status: Never   Smokeless tobacco: Never  Vaping Use   Vaping Use: Never used  Substance and Sexual Activity   Alcohol use: No    Alcohol/week: 0.0 standard drinks of alcohol    Comment: former alcohol use/ not currently   Drug use: No   Sexual activity: Yes    Partners: Female  Other Topics Concern   Not on file  Social History Narrative   Foster Parents   Lives with his wife.   Regular exercise: occasionally   Caffeine use: coffee daily   Social Determinants of Health   Financial Resource Strain: Not on file  Food Insecurity: Not on file  Transportation Needs: Not on file  Physical Activity: Not on file  Stress: Not on file  Social Connections: Not on file  Intimate Partner Violence: Not on file    Family History  Problem Relation Age of Onset   Diabetes Mother    Hyperlipidemia Mother    Hypertension Mother    Diabetes Father    Cancer Sister 86       uterus?   Diabetes Sister    Hypertension Brother    Diabetes Sister    Diabetes Sister    Diabetes Sister    Diabetes Brother    Hyperlipidemia Brother    Hypertension Brother    Colon cancer Neg Hx       Kriti Katayama, Baldemar Friday, MD 11/04/22 1157

## 2022-11-04 NOTE — Discharge Instructions (Addendum)
Take prednisone as directed on packaging

## 2022-11-10 ENCOUNTER — Other Ambulatory Visit (HOSPITAL_COMMUNITY): Payer: Self-pay

## 2022-11-10 ENCOUNTER — Encounter (HOSPITAL_COMMUNITY): Payer: Self-pay

## 2022-11-10 ENCOUNTER — Emergency Department (HOSPITAL_COMMUNITY)
Admission: EM | Admit: 2022-11-10 | Discharge: 2022-11-10 | Disposition: A | Discharge status: Home / Self Care | Payer: Medicare HMO | Attending: Emergency Medicine | Admitting: Emergency Medicine

## 2022-11-10 ENCOUNTER — Other Ambulatory Visit: Payer: Self-pay

## 2022-11-10 DIAGNOSIS — M5431 Sciatica, right side: Secondary | ICD-10-CM | POA: Insufficient documentation

## 2022-11-10 DIAGNOSIS — Z7984 Long term (current) use of oral hypoglycemic drugs: Secondary | ICD-10-CM | POA: Diagnosis not present

## 2022-11-10 DIAGNOSIS — Z79899 Other long term (current) drug therapy: Secondary | ICD-10-CM | POA: Insufficient documentation

## 2022-11-10 MED ORDER — METHYLPREDNISOLONE 4 MG PO TBPK
ORAL_TABLET | ORAL | 0 refills | Status: DC
  Filled 2022-11-10: qty 21, 6d supply, fill #0

## 2022-11-10 NOTE — ED Triage Notes (Signed)
Pt arrived from home via POV c/o sciatica 6/10 on pain scale. Pt c/o pain shooting from right hip to just above the right ankle. Pt states that he was seen at urgent care a few days ago with no relief.

## 2022-11-10 NOTE — ED Provider Notes (Signed)
[facility] AT [facility] Provider Note   CSN: [id number] Arrival date & time: 11/10/22  0035     History  Chief Complaint  Patient presents with   Sciatica    Ricardo Mills is a 67 y.o. male.  Patient presents to the emergency department complaining of right-sided radicular pain.  The patient states that he has had pain ongoing for approximately 2 weeks.  He denies any trauma prior to onset of symptoms.  He denies urinary incontinence, urinary retention, fecal incontinence, saddle anesthesia.  Patient reports sharp shooting pain going down the right leg to the level of the ankle.  He denies any numbness, tingling, weakness.  He does take gabapentin twice daily.  He has been seen twice at urgent care for the same complaint over the past week and has been treated with a Decadron injection, Toradol injection, and prednisone Dosepak.  He has completed the Dosepak.  Patient has history of type II DM, hypertension.  HPI     Home Medications Prior to Admission medications   Medication Sig Start Date End Date Taking? Authorizing Provider  methylPREDNISolone (MEDROL DOSEPAK) 4 MG TBPK tablet Take as directed per package instructions 11/10/22  Yes Darrick Grinder, PA-C  amLODipine (NORVASC) 2.5 MG tablet Take 1 tablet (2.5 mg total) by mouth daily. 09/11/20   Wallis Bamberg, PA-C  amLODipine (NORVASC) 5 MG tablet Take 1 tablet (5 mg total) by mouth daily. 04/15/21     amLODipine (NORVASC) 5 MG tablet Take 1 tablet (5 mg total) by mouth every evening. 04/29/21     amLODipine (NORVASC) 5 MG tablet Take 1 tablet (5 mg total) by mouth every evening. 08/28/22     amLODipine (NORVASC) 5 MG tablet Take 1 tablet (5 mg total) by mouth every evening. 09/01/22     blood glucose meter kit and supplies Per insurance preference. Test cbgs three times a day. Dx E11.65, Z79.4 09/09/18   Lezlie Lye, Meda Coffee, MD  dapagliflozin propanediol (FARXIGA) 10 MG TABS tablet Take 1 tablet (10 mg  total) by mouth daily. 03/26/22     empagliflozin (JARDIANCE) 10 MG TABS tablet Take 1 tablet by mouth once daily 01/31/21     gabapentin (NEURONTIN) 600 MG tablet Take 1 tablet (600 mg total) by mouth 2 (two) times daily. 04/26/22     glucose blood (ONETOUCH VERIO) test strip use 1 strip to test blood sugars 2-4 times daily 07/28/21     glucose blood (ONETOUCH VERIO) test strip Use 1 strip 2-4 (four) times daily. 03/26/22     insulin degludec (TRESIBA FLEXTOUCH) 100 UNIT/ML FlexTouch Pen Inject 20 Units into the skin daily (and increase as directed in clinic up to 60 units per day) 09/10/21     Insulin Glargine (BASAGLAR KWIKPEN) 100 UNIT/ML Inject 28 Units into the skin daily. 10/15/22     insulin NPH Human (NOVOLIN N RELION) 100 UNIT/ML injection Inject 0.2 mLs (20 Units total) into the skin 2 (two) times daily. 02/27/22     Insulin Pen Needle (TECHLITE PEN NEEDLES) 32G X 4 MM MISC Use as directed with insulin 03/26/22     Insulin Pen Needle 32G X 4 MM MISC Use as directed 09/10/21     Lancets (ONETOUCH DELICA PLUS LANCET33G) MISC use 1 lancet 2-4 times daily 07/28/21   Alysia Penna, MD  Lancets [facility] DELICA PLUS [place]) MISC Use to check blood glucose 4 (four) times daily. 03/26/22     losartan (COZAAR) 100 MG  tablet Take 1 tablet (100 mg total) by mouth daily. 06/23/21     losartan (COZAAR) 100 MG tablet Take 1 tablet (100 mg total) by mouth daily. 08/13/22     losartan (COZAAR) 100 MG tablet Take 1 tablet (100 mg total) by mouth daily. 09/01/22     metFORMIN (GLUCOPHAGE) 1000 MG tablet TAKE 1 TABLET (1,000 MG TOTAL) BY MOUTH 2 (TWO) TIMES DAILY WITH A MEAL. 05/27/20 05/27/21  [name], NP  metFORMIN (GLUCOPHAGE) 1000 MG tablet Take 1 tablet (1,000 mg total) by mouth 2 (two) times daily. 04/15/21     metFORMIN (GLUCOPHAGE) 1000 MG tablet Take 1 tablet (1,000 mg total) by mouth 2 (two) times daily with a meal. 04/29/21     metFORMIN (GLUCOPHAGE) 1000 MG tablet Take 1 tablet (1,000 mg total) by  mouth 2 (two) times daily. 10/15/22     metoprolol succinate (TOPROL-XL) 25 MG 24 hr tablet TAKE 1 TABLET (25 MG TOTAL) BY MOUTH DAILY. 05/27/20 05/27/21  [name], NP  metoprolol succinate (TOPROL-XL) 25 MG 24 hr tablet Take 1 tablet (25 mg total) by mouth daily. 04/15/21     metoprolol succinate (TOPROL-XL) 25 MG 24 hr tablet Take 1 tablet (25 mg total) by mouth daily. 04/29/21     metoprolol succinate (TOPROL-XL) 25 MG 24 hr tablet Take 1 tablet (25 mg total) by mouth daily. 08/28/22     metoprolol succinate (TOPROL-XL) 25 MG 24 hr tablet Take 1 tablet (25 mg total) by mouth daily. 09/01/22     ondansetron (ZOFRAN ODT) 4 MG disintegrating tablet Take 1 tablet (4 mg total) by mouth every 8 (eight) hours as needed for nausea or vomiting. 09/25/20   Raspet, Denny Peon K, PA-C  pantoprazole (PROTONIX) 20 MG tablet Take 1 tablet (20 mg total) by mouth daily. 09/25/20   Raspet, Erin K, PA-C  pantoprazole (PROTONIX) 20 MG tablet TAKE 1 TABLET BY MOUTH DAILY 10/08/20     pantoprazole (PROTONIX) 40 MG tablet Take 1 tablet (40 mg total) by mouth daily. 07/21/22     predniSONE (STERAPRED UNI-PAK 21 TAB) 10 MG (21) TBPK tablet Take as directed on packaging 11/04/22   Rafoth, Baldemar Friday, MD  sildenafil (REVATIO) 20 MG tablet Take 3 tabs by mouth once a day as needed. 05/26/19   Lezlie Lye, Meda Coffee, MD  simvastatin (ZOCOR) 20 MG tablet TAKE 1 TABLET (20 MG TOTAL) BY MOUTH EVERY EVENING. 05/27/20 05/27/21  [name], NP  simvastatin (ZOCOR) 20 MG tablet TAKE 1 TABLET BY MOUTH NIGHTLY AT BEDTIME 10/08/20     simvastatin (ZOCOR) 20 MG tablet Taek 1 tablet by mouth once daily in the evening 01/31/21     simvastatin (ZOCOR) 20 MG tablet Take 1 tablet (20 mg total) by mouth every evening. 04/29/21     simvastatin (ZOCOR) 20 MG tablet Take 1 tablet (20 mg total) by mouth daily, in the evening. 08/03/22         Allergies    Patient has no known allergies.    Review of Systems   Review of Systems  Physical Exam Updated  Vital Signs BP (!) 159/92 (BP Location: Left Arm)   Pulse 65   Temp 98.7 F (37.1 C) (Oral)   Resp 18   SpO2 99%  Physical Exam Vitals and nursing note reviewed.  HENT:     Head: Normocephalic and atraumatic.  Eyes:     Pupils: Pupils are equal, round, and reactive to light.  Pulmonary:     Effort: Pulmonary  effort is normal. No respiratory distress.  Musculoskeletal:        General: No signs of injury.     Cervical back: Normal range of motion.     Comments: L Spine - no deformity.  Nontender over lumbar spinous processes, no bony step-off.   Nontender palpation at the lumbar paraspinal musculature.  No pain with passive hip flexion.  5/5 strength with resisted hip flexion and knee extension.    Skin:    General: Skin is dry.  Neurological:     Mental Status: He is alert.  Psychiatric:        Speech: Speech normal.        Behavior: Behavior normal.     ED Results / Procedures / Treatments   Labs (all labs ordered are listed, but only abnormal results are displayed) Labs Reviewed - No data to display  EKG None  Radiology No results found.  Procedures Procedures    Medications Ordered in ED Medications - No data to display  ED Course/ Medical Decision Making/ A&P                             Medical Decision Making  This patient presents to the ED for concern of back pain, this involves an extensive number of treatment options, and is a complaint that carries with it a high risk of complications and morbidity.  The differential diagnosis includes radiculopathy, fracture, dislocation, cauda equina, others   Co morbidities that complicate the patient evaluation  Type II DM   Additional history obtained:  External records from outside source obtained and reviewed including urgent care notes    Imaging Studies ordered:  I considered imaging of the lumbar spine but the patient has no red flag symptoms.  He denies saddle anesthesia, urinary incontinence,  urinary retention, fecal incontinence, trauma.   Problem List / ED Course / Critical interventions / Medication management  I considered giving the patient a Decadron injection but the patient had 1 approximately 1 week ago. I have reviewed the patients home medicines and have made adjustments as needed    Test / Admission - Considered:  The patient's symptoms continue to be consistent with sciatica.  The patient has been treated with prednisone which provided some relief.  I explained to the patient that this is not a long-term solution but I do think it is reasonable to try an additional Dosepak.  Will prescribe steroid Dosepak.  Patient has been advised that he should follow-up with neurosurgery or orthopedics for further evaluation and management as his pain persists.  Return precautions provided.  No indication for admission.  Discharge home.         Final Clinical Impression(s) / ED Diagnoses Final diagnoses:  Right sided sciatica    Rx / DC Orders ED Discharge Orders          Ordered    methylPREDNISolone (MEDROL DOSEPAK) 4 MG TBPK tablet        11/10/22 0508              Darrick Grinder, PA-C 11/10/22 0509    Nira Conn, MD 11/10/22 [phone]

## 2022-11-10 NOTE — Discharge Instructions (Addendum)
You were evaluated today for right leg pain consistent with sciatica.  Please schedule a follow-up appointment with neurosurgery or orthopedics for further evaluation and management.  I have prescribed a Medrol Dosepak.  Please take as directed.  As discussed, this is not a good long-term option for treatment of your pain.  If you develop any life-threatening symptoms please return to the emergency department.

## 2022-11-17 ENCOUNTER — Telehealth: Payer: Self-pay

## 2022-11-17 NOTE — Telephone Encounter (Signed)
Transition Care Management Follow-up Telephone Call Date of discharge and from where: [facility] 7/2 How have you been since you were released from the hospital? Doing good  Any questions or concerns? No  Items Reviewed: Did the pt receive and understand the discharge instructions provided? Yes  Medications obtained and verified? Yes  Other? No  Any new allergies since your discharge? No  Dietary orders reviewed? No Do you have support at home? Yes   Follow up appointments reviewed:  PCP Hospital f/u appt confirmed? Yes  Scheduled to see PCP on 7/10 @ . Specialist Hospital f/u appt confirmed? No  Scheduled to see  on  @ . Are transportation arrangements needed? No  If their condition worsens, is the pt aware to call PCP or go to the Emergency Dept.? Yes Was the patient provided with contact information for the PCP's office or ED? Yes Was to pt encouraged to call back with questions or concerns? Yes

## 2022-12-01 ENCOUNTER — Other Ambulatory Visit (HOSPITAL_COMMUNITY): Payer: Self-pay

## 2022-12-01 MED ORDER — METOPROLOL SUCCINATE ER 25 MG PO TB24
25.0000 mg | ORAL_TABLET | Freq: Every day | ORAL | 3 refills | Status: DC
  Filled 2022-12-01: qty 90, 90d supply, fill #0
  Filled 2023-03-04: qty 90, 90d supply, fill #1
  Filled 2023-06-09: qty 90, 90d supply, fill #2
  Filled 2023-08-31: qty 90, 90d supply, fill #3

## 2022-12-01 MED ORDER — AMLODIPINE BESYLATE 5 MG PO TABS
5.0000 mg | ORAL_TABLET | Freq: Every evening | ORAL | 3 refills | Status: DC
  Filled 2022-12-01: qty 90, 90d supply, fill #0
  Filled 2023-03-04: qty 90, 90d supply, fill #1
  Filled 2023-06-01: qty 90, 90d supply, fill #2
  Filled 2023-08-31: qty 90, 90d supply, fill #3

## 2022-12-02 ENCOUNTER — Other Ambulatory Visit (HOSPITAL_COMMUNITY): Payer: Self-pay

## 2022-12-02 MED ORDER — AMLODIPINE BESYLATE 5 MG PO TABS
5.0000 mg | ORAL_TABLET | Freq: Every day | ORAL | 3 refills | Status: AC
  Filled 2022-12-02: qty 90, 90d supply, fill #0

## 2022-12-02 MED ORDER — METOPROLOL SUCCINATE ER 25 MG PO TB24
25.0000 mg | ORAL_TABLET | Freq: Every day | ORAL | 3 refills | Status: DC
  Filled 2022-12-02: qty 90, 90d supply, fill #0

## 2022-12-21 ENCOUNTER — Other Ambulatory Visit (HOSPITAL_COMMUNITY): Payer: Self-pay

## 2022-12-22 ENCOUNTER — Other Ambulatory Visit (HOSPITAL_COMMUNITY): Payer: Self-pay

## 2022-12-23 ENCOUNTER — Other Ambulatory Visit (HOSPITAL_COMMUNITY): Payer: Self-pay

## 2022-12-23 MED ORDER — GABAPENTIN 600 MG PO TABS
600.0000 mg | ORAL_TABLET | Freq: Three times a day (TID) | ORAL | 3 refills | Status: DC
  Filled 2022-12-23 (×2): qty 270, 90d supply, fill #0
  Filled 2023-04-02: qty 270, 90d supply, fill #1

## 2022-12-25 ENCOUNTER — Other Ambulatory Visit (HOSPITAL_COMMUNITY): Payer: Self-pay

## 2022-12-28 ENCOUNTER — Other Ambulatory Visit (HOSPITAL_COMMUNITY): Payer: Self-pay

## 2022-12-28 MED ORDER — LANTUS SOLOSTAR 100 UNIT/ML ~~LOC~~ SOPN
28.0000 [IU] | PEN_INJECTOR | Freq: Every day | SUBCUTANEOUS | 3 refills | Status: AC
  Filled 2022-12-28 – 2023-01-01 (×2): qty 9, 30d supply, fill #0
  Filled 2023-06-01: qty 9, 30d supply, fill #1

## 2023-01-01 ENCOUNTER — Other Ambulatory Visit (HOSPITAL_COMMUNITY): Payer: Self-pay

## 2023-01-01 ENCOUNTER — Other Ambulatory Visit: Payer: Self-pay

## 2023-01-08 ENCOUNTER — Other Ambulatory Visit (HOSPITAL_COMMUNITY): Payer: Self-pay

## 2023-01-28 ENCOUNTER — Other Ambulatory Visit (HOSPITAL_COMMUNITY): Payer: Self-pay

## 2023-01-28 MED ORDER — PANTOPRAZOLE SODIUM 40 MG PO PACK
40.0000 mg | PACK | Freq: Every day | ORAL | 3 refills | Status: DC
  Filled 2023-01-28: qty 90, 90d supply, fill #0

## 2023-02-02 ENCOUNTER — Other Ambulatory Visit (HOSPITAL_COMMUNITY): Payer: Self-pay

## 2023-02-02 MED ORDER — JARDIANCE 10 MG PO TABS
10.0000 mg | ORAL_TABLET | Freq: Every morning | ORAL | 3 refills | Status: DC
  Filled 2023-02-02: qty 90, 90d supply, fill #0
  Filled 2023-02-03: qty 30, 30d supply, fill #0

## 2023-02-02 MED ORDER — LANTUS SOLOSTAR 100 UNIT/ML ~~LOC~~ SOPN
30.0000 [IU] | PEN_INJECTOR | Freq: Every day | SUBCUTANEOUS | 3 refills | Status: DC
  Filled 2023-02-02: qty 24, 80d supply, fill #0
  Filled 2023-02-03: qty 9, 30d supply, fill #0
  Filled 2023-03-04: qty 9, 30d supply, fill #1
  Filled 2023-03-29: qty 9, 30d supply, fill #2
  Filled 2023-05-03: qty 9, 30d supply, fill #3
  Filled 2023-09-30: qty 9, 30d supply, fill #4
  Filled 2023-12-06: qty 9, 30d supply, fill #5

## 2023-02-02 MED ORDER — ROSUVASTATIN CALCIUM 10 MG PO TABS
10.0000 mg | ORAL_TABLET | Freq: Every day | ORAL | 3 refills | Status: DC
  Filled 2023-02-02: qty 90, 90d supply, fill #0
  Filled 2023-05-11: qty 90, 90d supply, fill #1
  Filled 2023-08-04: qty 90, 90d supply, fill #2
  Filled 2023-11-08: qty 90, 90d supply, fill #3

## 2023-02-03 ENCOUNTER — Other Ambulatory Visit (HOSPITAL_COMMUNITY): Payer: Self-pay

## 2023-02-03 MED ORDER — DICLOFENAC SODIUM 1 % EX GEL
4.0000 g | Freq: Four times a day (QID) | CUTANEOUS | 3 refills | Status: DC
  Filled 2023-02-03: qty 100, 10d supply, fill #0

## 2023-02-08 ENCOUNTER — Other Ambulatory Visit (HOSPITAL_COMMUNITY): Payer: Self-pay

## 2023-02-08 MED ORDER — PANTOPRAZOLE SODIUM 40 MG PO TBEC
40.0000 mg | DELAYED_RELEASE_TABLET | Freq: Every day | ORAL | 3 refills | Status: DC
  Filled 2023-02-08: qty 90, 90d supply, fill #0
  Filled 2023-05-11: qty 90, 90d supply, fill #1
  Filled 2023-08-04: qty 90, 90d supply, fill #2
  Filled 2023-11-07: qty 90, 90d supply, fill #3

## 2023-03-04 ENCOUNTER — Other Ambulatory Visit (HOSPITAL_COMMUNITY): Payer: Self-pay

## 2023-03-29 ENCOUNTER — Other Ambulatory Visit (HOSPITAL_COMMUNITY): Payer: Self-pay

## 2023-03-30 ENCOUNTER — Other Ambulatory Visit (HOSPITAL_COMMUNITY): Payer: Self-pay

## 2023-04-02 ENCOUNTER — Other Ambulatory Visit (HOSPITAL_COMMUNITY): Payer: Self-pay

## 2023-04-05 ENCOUNTER — Other Ambulatory Visit (HOSPITAL_COMMUNITY): Payer: Self-pay

## 2023-04-19 ENCOUNTER — Other Ambulatory Visit (HOSPITAL_COMMUNITY): Payer: Self-pay

## 2023-04-23 ENCOUNTER — Other Ambulatory Visit (HOSPITAL_COMMUNITY): Payer: Self-pay

## 2023-04-23 MED ORDER — PREGABALIN 100 MG PO CAPS
100.0000 mg | ORAL_CAPSULE | Freq: Three times a day (TID) | ORAL | 5 refills | Status: DC
  Filled 2023-04-23: qty 90, 30d supply, fill #0
  Filled 2023-05-18 – 2023-05-21 (×2): qty 90, 30d supply, fill #1

## 2023-05-03 ENCOUNTER — Other Ambulatory Visit (HOSPITAL_COMMUNITY): Payer: Self-pay

## 2023-05-04 ENCOUNTER — Other Ambulatory Visit (HOSPITAL_COMMUNITY): Payer: Self-pay

## 2023-05-04 MED ORDER — LANTUS SOLOSTAR 100 UNIT/ML ~~LOC~~ SOPN
30.0000 [IU] | PEN_INJECTOR | Freq: Every day | SUBCUTANEOUS | 3 refills | Status: AC
  Filled 2023-06-30: qty 9, 30d supply, fill #0
  Filled 2023-07-22 – 2023-07-23 (×2): qty 9, 30d supply, fill #1
  Filled 2023-09-02: qty 9, 30d supply, fill #2
  Filled 2023-11-07: qty 9, 30d supply, fill #3

## 2023-05-17 ENCOUNTER — Encounter: Payer: Medicare HMO | Admitting: Podiatry

## 2023-05-17 NOTE — Progress Notes (Signed)
 Patient did not show for his scheduled appointment this morning

## 2023-05-19 ENCOUNTER — Other Ambulatory Visit: Payer: Self-pay

## 2023-05-19 ENCOUNTER — Other Ambulatory Visit (HOSPITAL_COMMUNITY): Payer: Self-pay

## 2023-05-21 ENCOUNTER — Other Ambulatory Visit: Payer: Self-pay

## 2023-05-25 ENCOUNTER — Other Ambulatory Visit (HOSPITAL_COMMUNITY): Payer: Self-pay

## 2023-05-25 ENCOUNTER — Ambulatory Visit: Payer: Medicare HMO | Admitting: Podiatry

## 2023-05-25 ENCOUNTER — Encounter: Payer: Self-pay | Admitting: Podiatry

## 2023-05-25 VITALS — Ht 67.0 in | Wt 191.0 lb

## 2023-05-25 DIAGNOSIS — B351 Tinea unguium: Secondary | ICD-10-CM

## 2023-05-25 DIAGNOSIS — M79675 Pain in left toe(s): Secondary | ICD-10-CM

## 2023-05-25 DIAGNOSIS — Z0189 Encounter for other specified special examinations: Secondary | ICD-10-CM

## 2023-05-25 DIAGNOSIS — E119 Type 2 diabetes mellitus without complications: Secondary | ICD-10-CM

## 2023-05-25 DIAGNOSIS — B353 Tinea pedis: Secondary | ICD-10-CM

## 2023-05-25 DIAGNOSIS — M79674 Pain in right toe(s): Secondary | ICD-10-CM

## 2023-05-25 MED ORDER — KETOCONAZOLE 2 % EX CREA
1.0000 | TOPICAL_CREAM | Freq: Every day | CUTANEOUS | 2 refills | Status: DC
  Filled 2023-05-25: qty 60, 30d supply, fill #0

## 2023-05-27 NOTE — Progress Notes (Signed)
  Subjective:  Patient ID: Ricardo Mills, male    DOB: 1956/02/15,  MRN: [id number]  Chief Complaint  Patient presents with   Nail Problem    He is here to establish for nail care and is diabetic,  last A1C level was 11 something     68 y.o. male presents with the above complaint. History confirmed with patient.   Objective:  Physical Exam: warm, good capillary refill, no trophic changes or ulcerative lesions, normal DP and PT pulses, tinea pedis, and abnormal monofilament exam and sensory exam. Left Foot: dystrophic yellowed discolored nail plates with subungual debris Right Foot: dystrophic yellowed discolored nail plates with subungual debris   Assessment:   1. Pain due to onychomycosis of toenails of both feet   2. Tinea pedis of both feet   3. Encounter for diabetic foot exam [facility])      Plan:  Patient was evaluated and treated and all questions answered.  Patient educated on diabetes. Discussed proper diabetic foot care and discussed risks and complications of disease. Educated patient in depth on reasons to return to the office immediately should he/she discover anything concerning or new on the feet. All questions answered. Discussed proper shoes as well.   Discussed the etiology and treatment options for the condition in detail with the patient.  Recommended debridement of the nails today. Sharp and mechanical debridement performed of all painful and mycotic nails today. Nails debrided in length and thickness using a nail nipper to level of comfort. Discussed treatment options including appropriate shoe gear. Follow up as needed for painful nails.  Discussed the etiology and treatment options for tinea pedis.  Discussed topical and oral treatment.  Recommended topical treatment with 2% ketoconazole  cream.  This was sent to the patient's pharmacy.  Also discussed appropriate foot hygiene, use of antifungal spray such as Tinactin in shoes, as well as cleaning her foot  surfaces such as showers and bathroom floors with bleach.   Return in about 3 months (around 08/23/2023) for at risk diabetic foot care.

## 2023-06-01 ENCOUNTER — Other Ambulatory Visit (HOSPITAL_COMMUNITY): Payer: Self-pay

## 2023-06-09 ENCOUNTER — Other Ambulatory Visit (HOSPITAL_COMMUNITY): Payer: Self-pay

## 2023-06-11 ENCOUNTER — Other Ambulatory Visit (HOSPITAL_COMMUNITY): Payer: Self-pay

## 2023-06-11 MED ORDER — OZEMPIC (0.25 OR 0.5 MG/DOSE) 2 MG/3ML ~~LOC~~ SOPN
0.2500 mg | PEN_INJECTOR | SUBCUTANEOUS | 3 refills | Status: DC
  Filled 2023-06-11: qty 3, 28d supply, fill #0
  Filled 2023-08-10: qty 3, 42d supply, fill #0

## 2023-06-14 ENCOUNTER — Other Ambulatory Visit (HOSPITAL_COMMUNITY): Payer: Self-pay

## 2023-06-17 ENCOUNTER — Other Ambulatory Visit: Payer: Self-pay

## 2023-06-17 ENCOUNTER — Other Ambulatory Visit (HOSPITAL_COMMUNITY): Payer: Self-pay

## 2023-06-17 MED ORDER — PREGABALIN 200 MG PO CAPS
200.0000 mg | ORAL_CAPSULE | Freq: Two times a day (BID) | ORAL | 3 refills | Status: DC
  Filled 2023-06-17: qty 60, 30d supply, fill #0
  Filled 2023-07-19: qty 60, 30d supply, fill #1
  Filled 2023-08-19: qty 60, 30d supply, fill #2
  Filled 2023-09-15: qty 60, 30d supply, fill #3

## 2023-06-30 ENCOUNTER — Other Ambulatory Visit (HOSPITAL_COMMUNITY): Payer: Self-pay

## 2023-07-01 ENCOUNTER — Other Ambulatory Visit (HOSPITAL_COMMUNITY): Payer: Self-pay

## 2023-07-19 ENCOUNTER — Other Ambulatory Visit: Payer: Self-pay

## 2023-07-19 ENCOUNTER — Other Ambulatory Visit (HOSPITAL_COMMUNITY): Payer: Self-pay

## 2023-07-20 ENCOUNTER — Other Ambulatory Visit (HOSPITAL_COMMUNITY): Payer: Self-pay

## 2023-07-22 ENCOUNTER — Other Ambulatory Visit (HOSPITAL_COMMUNITY): Payer: Self-pay

## 2023-07-22 MED ORDER — TADALAFIL 5 MG PO TABS
5.0000 mg | ORAL_TABLET | Freq: Every day | ORAL | 11 refills | Status: AC
  Filled 2023-07-22: qty 30, 7d supply, fill #0
  Filled 2023-09-15: qty 30, 7d supply, fill #1
  Filled 2023-12-06: qty 30, 7d supply, fill #2

## 2023-07-23 ENCOUNTER — Other Ambulatory Visit: Payer: Self-pay

## 2023-08-10 ENCOUNTER — Other Ambulatory Visit (HOSPITAL_COMMUNITY): Payer: Self-pay

## 2023-08-19 ENCOUNTER — Other Ambulatory Visit: Payer: Self-pay

## 2023-08-19 ENCOUNTER — Other Ambulatory Visit (HOSPITAL_COMMUNITY): Payer: Self-pay

## 2023-08-25 ENCOUNTER — Encounter: Payer: Self-pay | Admitting: Podiatry

## 2023-08-25 ENCOUNTER — Ambulatory Visit: Payer: Medicare HMO | Admitting: Podiatry

## 2023-08-25 DIAGNOSIS — M79675 Pain in left toe(s): Secondary | ICD-10-CM | POA: Diagnosis not present

## 2023-08-25 DIAGNOSIS — Z794 Long term (current) use of insulin: Secondary | ICD-10-CM

## 2023-08-25 DIAGNOSIS — B351 Tinea unguium: Secondary | ICD-10-CM | POA: Diagnosis not present

## 2023-08-25 DIAGNOSIS — M79674 Pain in right toe(s): Secondary | ICD-10-CM | POA: Diagnosis not present

## 2023-08-25 DIAGNOSIS — E1165 Type 2 diabetes mellitus with hyperglycemia: Secondary | ICD-10-CM | POA: Diagnosis not present

## 2023-08-25 NOTE — Progress Notes (Signed)
 This patient returns to my office for at risk foot care.  This patient requires this care by a professional since this patient will be at risk due to having diabetic neuropathy.  This patient is unable to cut nails himself since the patient cannot reach his nails.These nails are painful walking and wearing shoes.  This patient presents for at risk foot care today.  General Appearance  Alert, conversant and in no acute stress.  Vascular  Dorsalis pedis and posterior tibial  pulses are palpable  bilaterally.  Capillary return is within normal limits  bilaterally. Temperature is within normal limits  bilaterally.  Neurologic  Senn-Weinstein monofilament wire test within normal limits  bilaterally. Muscle power within normal limits bilaterally.  Nails Thick disfigured discolored nails with subungual debris  from hallux to fifth toes bilaterally. No evidence of bacterial infection or drainage bilaterally.  Orthopedic  No limitations of motion  feet .  No crepitus or effusions noted.  No bony pathology or digital deformities noted.  Skin  normotropic skin with no porokeratosis noted bilaterally.  No signs of infections or ulcers noted.     Onychomycosis  Pain in right toes  Pain in left toes  Consent was obtained for treatment procedures.   Mechanical debridement of nails 1-5  bilaterally performed with a nail nipper.  Filed with dremel without incident.    Return office visit   3 months                   Told patient to return for periodic foot care and evaluation due to potential at risk complications.   Helane Gunther DPM

## 2023-08-26 ENCOUNTER — Other Ambulatory Visit (HOSPITAL_COMMUNITY): Payer: Self-pay

## 2023-08-26 MED ORDER — TRULICITY 0.75 MG/0.5ML ~~LOC~~ SOAJ
0.7500 mg | SUBCUTANEOUS | 3 refills | Status: DC
  Filled 2023-08-26: qty 6, 84d supply, fill #0
  Filled 2023-09-02 – 2023-09-03 (×2): qty 2, 28d supply, fill #0

## 2023-08-26 MED ORDER — AMLODIPINE BESYLATE 5 MG PO TABS
5.0000 mg | ORAL_TABLET | Freq: Every day | ORAL | 3 refills | Status: AC
  Filled 2023-08-26: qty 90, 90d supply, fill #0
  Filled 2023-11-22: qty 90, 90d supply, fill #1
  Filled 2024-02-22: qty 90, 90d supply, fill #2
  Filled 2024-05-25: qty 90, 90d supply, fill #3

## 2023-08-26 MED ORDER — METOPROLOL SUCCINATE ER 25 MG PO TB24
25.0000 mg | ORAL_TABLET | Freq: Every day | ORAL | 3 refills | Status: AC
  Filled 2023-08-26: qty 90, 90d supply, fill #0
  Filled 2023-12-06: qty 90, 90d supply, fill #1
  Filled 2024-05-26: qty 90, 90d supply, fill #2

## 2023-08-26 MED ORDER — METFORMIN HCL 1000 MG PO TABS
1000.0000 mg | ORAL_TABLET | Freq: Two times a day (BID) | ORAL | 3 refills | Status: AC
  Filled 2023-08-26: qty 180, 90d supply, fill #0
  Filled 2024-02-22: qty 180, 90d supply, fill #1

## 2023-08-31 ENCOUNTER — Other Ambulatory Visit (HOSPITAL_COMMUNITY): Payer: Self-pay

## 2023-09-01 ENCOUNTER — Other Ambulatory Visit (HOSPITAL_COMMUNITY): Payer: Self-pay

## 2023-09-02 ENCOUNTER — Other Ambulatory Visit (HOSPITAL_COMMUNITY): Payer: Self-pay

## 2023-09-03 ENCOUNTER — Other Ambulatory Visit (HOSPITAL_COMMUNITY): Payer: Self-pay

## 2023-09-15 ENCOUNTER — Other Ambulatory Visit (HOSPITAL_COMMUNITY): Payer: Self-pay

## 2023-09-30 ENCOUNTER — Other Ambulatory Visit (HOSPITAL_COMMUNITY): Payer: Self-pay

## 2023-10-20 ENCOUNTER — Other Ambulatory Visit (HOSPITAL_COMMUNITY): Payer: Self-pay

## 2023-10-20 MED ORDER — LOSARTAN POTASSIUM 100 MG PO TABS
100.0000 mg | ORAL_TABLET | Freq: Every day | ORAL | 3 refills | Status: AC
  Filled 2023-10-20: qty 90, 90d supply, fill #0
  Filled 2024-01-20: qty 90, 90d supply, fill #1
  Filled 2024-04-24: qty 90, 90d supply, fill #2

## 2023-10-21 ENCOUNTER — Other Ambulatory Visit (HOSPITAL_BASED_OUTPATIENT_CLINIC_OR_DEPARTMENT_OTHER): Payer: Self-pay

## 2023-10-21 ENCOUNTER — Other Ambulatory Visit (HOSPITAL_COMMUNITY): Payer: Self-pay

## 2023-10-21 MED ORDER — PREGABALIN 200 MG PO CAPS
200.0000 mg | ORAL_CAPSULE | Freq: Two times a day (BID) | ORAL | 5 refills | Status: DC
  Filled 2023-10-21: qty 60, 30d supply, fill #0
  Filled 2023-11-22: qty 60, 30d supply, fill #1
  Filled 2023-12-22: qty 60, 30d supply, fill #2
  Filled 2024-01-21: qty 60, 30d supply, fill #3
  Filled 2024-02-22: qty 60, 30d supply, fill #4
  Filled 2024-03-27: qty 60, 30d supply, fill #5

## 2023-10-22 ENCOUNTER — Other Ambulatory Visit (HOSPITAL_COMMUNITY): Payer: Self-pay

## 2023-11-08 ENCOUNTER — Other Ambulatory Visit (HOSPITAL_COMMUNITY): Payer: Self-pay

## 2023-11-17 ENCOUNTER — Other Ambulatory Visit (HOSPITAL_COMMUNITY): Payer: Self-pay

## 2023-11-17 MED ORDER — ROSUVASTATIN CALCIUM 10 MG PO TABS
10.0000 mg | ORAL_TABLET | Freq: Every day | ORAL | 3 refills | Status: AC
  Filled 2024-02-22: qty 90, 90d supply, fill #0
  Filled 2024-05-25: qty 90, 90d supply, fill #1

## 2023-11-17 MED ORDER — PANTOPRAZOLE SODIUM 40 MG PO TBEC
40.0000 mg | DELAYED_RELEASE_TABLET | Freq: Every day | ORAL | 3 refills | Status: AC
  Filled 2023-11-17 – 2024-03-27 (×2): qty 90, 90d supply, fill #0

## 2023-11-17 MED ORDER — GLIPIZIDE ER 5 MG PO TB24
5.0000 mg | ORAL_TABLET | Freq: Every morning | ORAL | 3 refills | Status: AC
  Filled 2023-11-17: qty 90, 90d supply, fill #0
  Filled 2024-02-22: qty 90, 90d supply, fill #1
  Filled 2024-05-26: qty 90, 90d supply, fill #2

## 2023-11-18 ENCOUNTER — Other Ambulatory Visit (HOSPITAL_BASED_OUTPATIENT_CLINIC_OR_DEPARTMENT_OTHER): Payer: Self-pay | Admitting: Student

## 2023-11-18 ENCOUNTER — Other Ambulatory Visit (HOSPITAL_COMMUNITY): Payer: Self-pay

## 2023-11-18 DIAGNOSIS — M79671 Pain in right foot: Secondary | ICD-10-CM

## 2023-11-22 ENCOUNTER — Other Ambulatory Visit: Payer: Self-pay

## 2023-11-24 ENCOUNTER — Ambulatory Visit: Admitting: Podiatry

## 2023-12-01 ENCOUNTER — Other Ambulatory Visit (HOSPITAL_COMMUNITY): Payer: Self-pay

## 2023-12-01 MED ORDER — ACCU-CHEK GUIDE W/DEVICE KIT
PACK | Freq: Three times a day (TID) | 0 refills | Status: AC
  Filled 2023-12-01: qty 1, 30d supply, fill #0

## 2023-12-01 MED ORDER — LANCETS MISC
Freq: Three times a day (TID) | 3 refills | Status: AC
  Filled 2023-12-01: qty 100, 30d supply, fill #0

## 2023-12-01 MED ORDER — ACCU-CHEK GUIDE TEST VI STRP
ORAL_STRIP | Freq: Three times a day (TID) | 3 refills | Status: AC
  Filled 2023-12-01: qty 250, 84d supply, fill #0

## 2023-12-06 ENCOUNTER — Other Ambulatory Visit (HOSPITAL_COMMUNITY): Payer: Self-pay

## 2023-12-22 ENCOUNTER — Other Ambulatory Visit: Payer: Self-pay

## 2023-12-24 ENCOUNTER — Other Ambulatory Visit (HOSPITAL_COMMUNITY): Payer: Self-pay

## 2023-12-28 ENCOUNTER — Ambulatory Visit (HOSPITAL_COMMUNITY)
Admission: EM | Admit: 2023-12-28 | Discharge: 2023-12-28 | Disposition: A | Discharge status: Home / Self Care | Attending: Emergency Medicine | Admitting: Emergency Medicine

## 2023-12-28 ENCOUNTER — Other Ambulatory Visit (HOSPITAL_COMMUNITY): Payer: Self-pay

## 2023-12-28 ENCOUNTER — Other Ambulatory Visit: Payer: Self-pay

## 2023-12-28 ENCOUNTER — Ambulatory Visit (HOSPITAL_COMMUNITY)

## 2023-12-28 ENCOUNTER — Encounter (HOSPITAL_COMMUNITY): Payer: Self-pay | Admitting: Emergency Medicine

## 2023-12-28 DIAGNOSIS — M5431 Sciatica, right side: Secondary | ICD-10-CM

## 2023-12-28 DIAGNOSIS — G5761 Lesion of plantar nerve, right lower limb: Secondary | ICD-10-CM | POA: Diagnosis not present

## 2023-12-28 MED ORDER — KETOROLAC TROMETHAMINE 30 MG/ML IJ SOLN
30.0000 mg | Freq: Once | INTRAMUSCULAR | Status: AC
  Administered 2023-12-28: 30 mg via INTRAMUSCULAR

## 2023-12-28 MED ORDER — IBUPROFEN 400 MG PO TABS
400.0000 mg | ORAL_TABLET | Freq: Four times a day (QID) | ORAL | 0 refills | Status: AC | PRN
  Filled 2023-12-28: qty 30, 8d supply, fill #0

## 2023-12-28 MED ORDER — KETOROLAC TROMETHAMINE 30 MG/ML IJ SOLN
INTRAMUSCULAR | Status: AC
  Filled 2023-12-28: qty 1

## 2023-12-28 NOTE — ED Triage Notes (Signed)
 Right back pain initially, pain radiates down right leg.  Patient also concerned about a painful area in right foot when weight bearing.  Patient has been treated with prednisone  in the past and it did help.    Patient has used tylenol .  Patient did have left over prednisone  and took those Sunday.  This helped initially

## 2023-12-28 NOTE — ED Provider Notes (Signed)
 [facility]    CSN: [id number] Arrival date & time: 12/28/23  0920     History   Chief Complaint Chief Complaint  Patient presents with   Leg Pain    HPI Ricardo Mills is a 68 y.o. male.  Here with right leg pain. Radiates down to the foot. He has history of degenerative lumbar disease and sciatica Seen 3 times for this last year, had a steroid shot and 2 rounds or oral steroids.  Reports he had leftover steroid and he took them 2 days ago. Also used tylenol  Pain currently 7/10 in the right leg. The back is not bothering him today.   Denies injury, trauma, fall No bladder/bowel dysfunction  Does report area of numbness in the right shin. Wanted to make sure it wasn't a blood clot. Also having several weeks of feeling a ball in the foot when he walks.  Denies wearing tight shoes.  He is working on getting DM under control. Now has a dexcom. Reading currently is 139. He is trying to eat healthy and exercising more. States last A1c was around 9.  Past Medical History:  Diagnosis Date   Diabetes mellitus without complication (HCC)    Hyperlipidemia    Hypertension     Patient Active Problem List   Diagnosis Date Noted   Halitosis 03/28/2012   Diabetes (HCC) 06/21/2011   HTN (hypertension) 06/21/2011   Hyperlipidemia LDL goal <70 06/21/2011   ED (erectile dysfunction) 06/21/2011   Diabetes mellitus type 2, uncomplicated (HCC) 02/15/2011    Past Surgical History:  Procedure Laterality Date   CIRCUMCISION         Home Medications    Prior to Admission medications   Medication Sig Start Date End Date Taking? Authorizing Provider  ibuprofen  (ADVIL ) 400 MG tablet Take 1 tablet (400 mg total) by mouth every 6 (six) hours as needed. 12/29/23  Yes [place] Rico, Asberry, PA-C  amLODipine  (NORVASC ) 5 MG tablet Take 1 tablet (5 mg total) by mouth daily. 12/02/22     amLODipine  (NORVASC ) 5 MG tablet Take 1 tablet (5 mg total) by mouth daily. 08/26/23     blood  glucose meter kit and supplies Per insurance preference. Test cbgs three times a day. Dx E11.65, Z79.4 09/09/18   Melonie Colonel, Mikel HERO, MD  Blood Glucose Monitoring Suppl (ACCU-CHEK GUIDE) w/Device KIT Use to monitor Blood Sugars three times daily. 12/01/23     dapagliflozin  propanediol (FARXIGA ) 10 MG TABS tablet Take 1 tablet (10 mg total) by mouth daily. 03/26/22     empagliflozin  (JARDIANCE ) 10 MG TABS tablet Take 1 tablet by mouth once daily 01/31/21     glipiZIDE  (GLUCOTROL  XL) 5 MG 24 hr tablet Take 1 tablet (5 mg total) by mouth in the morning for diabetes 11/17/23     glucose blood (ACCU-CHEK GUIDE TEST) test strip Use to test Blood Sugars three times daily 12/01/23     insulin  degludec (TRESIBA  FLEXTOUCH) 100 UNIT/ML FlexTouch Pen Inject 20 Units into the skin daily (and increase as directed in clinic up to 60 units per day) 09/10/21     Insulin  Glargine (BASAGLAR  KWIKPEN) 100 UNIT/ML Inject 28 Units into the skin daily. 10/15/22     insulin  NPH Human (NOVOLIN N RELION) 100 UNIT/ML injection Inject 0.2 mLs (20 Units total) into the skin 2 (two) times daily. 02/27/22     Insulin  Pen Needle (TECHLITE PEN NEEDLES) 32G X 4 MM MISC Use as directed with insulin  03/26/22  Lancets MISC Use to test Blood Sugars three times daily. 12/01/23     LANTUS  SOLOSTAR 100 UNIT/ML Solostar Pen Inject 28 Units into the skin once daily. 12/28/22     LANTUS  SOLOSTAR 100 UNIT/ML Solostar Pen Inject 30 Units into the skin daily. 05/04/23     losartan  (COZAAR ) 100 MG tablet Take 1 tablet (100 mg total) by mouth daily. 10/20/23     metFORMIN  (GLUCOPHAGE ) 1000 MG tablet Take 1 tablet (1,000 mg total) by mouth 2 (two) times daily for diabetes 08/26/23     metoprolol  succinate (TOPROL -XL) 25 MG 24 hr tablet Take 1 tablet (25 mg total) by mouth daily. 08/26/23     pantoprazole  (PROTONIX ) 40 MG tablet Take 1 tablet (40 mg total) by mouth daily. 11/17/23     pregabalin  (LYRICA ) 200 MG capsule Take 1 capsule (200 mg total) by mouth 2  (two) times daily for neuropathy. 10/21/23     rosuvastatin  (CRESTOR ) 10 MG tablet Take 1 tablet (10 mg total) by mouth daily for cholesterol. 11/17/23     simvastatin  (ZOCOR ) 20 MG tablet Take 1 tablet (20 mg total) by mouth daily, in the evening. 08/03/22     tadalafil  (CIALIS ) 5 MG tablet Take 1-4 tablets (5-20 mg total) by mouth as needed 1 hour prior to intercourse. 07/22/23     gabapentin  (NEURONTIN ) 600 MG tablet Take 1 tablet (600 mg total) by mouth 3 (three) times daily. 12/23/22 04/23/23      Family History Family History  Problem Relation Age of Onset   Diabetes Mother    Hyperlipidemia Mother    Hypertension Mother    Diabetes Father    Cancer Sister 65       uterus?   Diabetes Sister    Hypertension Brother    Diabetes Sister    Diabetes Sister    Diabetes Sister    Diabetes Brother    Hyperlipidemia Brother    Hypertension Brother    Colon cancer Neg Hx     Social History Social History   Tobacco Use   Smoking status: Never   Smokeless tobacco: Never  Vaping Use   Vaping status: Never Used  Substance Use Topics   Alcohol use: No    Alcohol/week: 0.0 standard drinks of alcohol    Comment: former alcohol use/ not currently   Drug use: No     Allergies   Patient has no known allergies.   Review of Systems Review of Systems  As per HPI  Physical Exam Triage Vital Signs ED Triage Vitals  Encounter Vitals Group     BP 12/28/23 0942 (!) 149/81     Girls Systolic BP Percentile --      Girls Diastolic BP Percentile --      Boys Systolic BP Percentile --      Boys Diastolic BP Percentile --      Pulse Rate 12/28/23 0942 (!) 58     Resp 12/28/23 0942 18     Temp 12/28/23 0942 98 F (36.7 C)     Temp Source 12/28/23 0942 Oral     SpO2 12/28/23 0942 97 %     Weight --      Height --      Head Circumference --      Peak Flow --      Pain Score 12/28/23 0939 7     Pain Loc --      Pain Education --      Exclude from Growth Chart --  No data  found.  Updated Vital Signs BP (!) 149/81 (BP Location: Left Arm) Comment (BP Location): large cuff  Pulse 65   Temp 98 F (36.7 C) (Oral)   Resp 18   SpO2 97%    Physical Exam Vitals and nursing note reviewed.  Constitutional:      General: He is not in acute distress. HENT:     Mouth/Throat:     Mouth: Mucous membranes are moist.     Pharynx: Oropharynx is clear.  Eyes:     Extraocular Movements: Extraocular movements intact.     Conjunctiva/sclera: Conjunctivae normal.     Pupils: Pupils are equal, round, and reactive to light.  Cardiovascular:     Rate and Rhythm: Normal rate and regular rhythm.     Heart sounds: Normal heart sounds.  Pulmonary:     Effort: Pulmonary effort is normal.     Breath sounds: Normal breath sounds.  Musculoskeletal:     Cervical back: Normal range of motion. No rigidity or tenderness.     Lumbar back: No tenderness or bony tenderness. Positive right straight leg raise test.       Legs:       Feet:     Comments: No back pain today. No bony tenderness C-L spine. No lumbar paraspinal tenderness. +SLR on right. Reports one area of decreased sensation on the right lateral shin. Distal sensation intact and equal. DP pulses 2+. Full ROM of ankles, knees, toes. Cap refill < 2 seconds   Feet:     Comments: Patient feels sensation of a ball in this area. There is no abnormality palpated on exam. Long toenails. No ulcerations on feet. Dry skin.  Skin:    General: Skin is warm and dry.     Comments: No bruising, lesions, erythema, rash on back.   Neurological:     General: No focal deficit present.     Mental Status: He is alert and oriented to person, place, and time.     Cranial Nerves: No cranial nerve deficit.     Sensory: Sensation is intact.     Motor: Motor function is intact. No weakness.     Coordination: Coordination is intact.     Gait: Gait is intact.     Comments: Strength 5/5     UC Treatments / Results  Labs (all labs ordered  are listed, but only abnormal results are displayed) Labs Reviewed - No data to display  EKG  Radiology No results found.  Procedures Procedures   Medications Ordered in UC Medications  ketorolac  (TORADOL ) 30 MG/ML injection 30 mg (30 mg Intramuscular Given 12/28/23 1039)    Initial Impression / Assessment and Plan / UC Course  I have reviewed the triage vital signs and the nursing notes.  Pertinent labs & imaging results that were available during my care of the patient were reviewed by me and considered in my medical decision making (see chart for details).  Overall well appearing Neurologically intact. No red flags  Right side sciatica  DM history, working on improving. We discussed further steroids would increase blood glucose. He is willing to try NSAIDs first. IM toradol  given in clinic. Can continue tylenol , in 24 hours will start oral ibuprofen . Denies kidney disease history. He had recent labs with PCP, not viewable in our system. Reports no kidney issue.  Discussed the right shin numbness could be related to the sciatica, and the nerve that branches off sciatic nerve (consider fibular nerve). Otherwise the  numbness could be related to his diabetes. Discussion about continuing DM management. He has no calf pain or swelling. Discussed with patient I have very low concern for a blood clot and that numbness is not usually a symptom. Reassurance provided.   The ball in foot sensation is likely morton neuroma. No abnormality on exam. No injury or trauma to indicate need for xray imaging.  He does have podiatrist who trims his nails. Recommend follow with them regarding this, and to have annual DM foot exams which he has not been doing.  Advised return precautions. Patient is agreeable to plan, all questions answered.   Final Clinical Impressions(s) / UC Diagnoses   Final diagnoses:  Sciatica of right side  Morton's neuroma of right foot     Discharge Instructions       The Toradol  injection given today should start to work in about 30 minutes. Please do not use any NSAIDs (ibuprofen /Advil , naproxen/Aleve, etc) for the next 24 hours. You can safely use tylenol .   Starting in 24 hours (tomorrow morning around 11) you can start taking ibuprofen  by mouth. 1 tablet every 6 hours as needed for pain.  Please follow up with the podiatrist regarding the sensation in your foot, which may be Morton's neuroma.   Continue to take your diabetes medicine as prescribed, and follow closely with your primary care provider!     ED Prescriptions     Medication Sig Dispense Auth. Provider   ibuprofen  (ADVIL ) 400 MG tablet Take 1 tablet (400 mg total) by mouth every 6 (six) hours as needed. 30 tablet Madina Galati, Asberry, PA-C      PDMP not reviewed this encounter.   Jeryl Asberry, [place] 12/28/23 1118

## 2023-12-28 NOTE — Discharge Instructions (Addendum)
 The Toradol  injection given today should start to work in about 30 minutes. Please do not use any NSAIDs (ibuprofen /Advil , naproxen/Aleve, etc) for the next 24 hours. You can safely use tylenol .   Starting in 24 hours (tomorrow morning around 11) you can start taking ibuprofen  by mouth. 1 tablet every 6 hours as needed for pain.  Please follow up with the podiatrist regarding the sensation in your foot, which may be Morton's neuroma.   Continue to take your diabetes medicine as prescribed, and follow closely with your primary care provider!

## 2023-12-29 ENCOUNTER — Ambulatory Visit: Admitting: Podiatry

## 2024-01-04 ENCOUNTER — Other Ambulatory Visit (HOSPITAL_COMMUNITY): Payer: Self-pay

## 2024-01-05 ENCOUNTER — Other Ambulatory Visit (HOSPITAL_COMMUNITY): Payer: Self-pay

## 2024-01-05 MED ORDER — LANTUS SOLOSTAR 100 UNIT/ML ~~LOC~~ SOPN
30.0000 [IU] | PEN_INJECTOR | Freq: Every day | SUBCUTANEOUS | 3 refills | Status: AC
  Filled 2024-01-05: qty 9, 30d supply, fill #0

## 2024-01-14 ENCOUNTER — Other Ambulatory Visit (HOSPITAL_COMMUNITY): Payer: Self-pay

## 2024-01-14 MED ORDER — LANTUS SOLOSTAR 100 UNIT/ML ~~LOC~~ SOPN
38.0000 [IU] | PEN_INJECTOR | Freq: Every day | SUBCUTANEOUS | 3 refills | Status: AC
  Filled 2024-01-14: qty 12, 31d supply, fill #0
  Filled 2024-01-31: qty 33, 86d supply, fill #0
  Filled 2024-01-31: qty 9, 23d supply, fill #0
  Filled 2024-02-22: qty 9, 23d supply, fill #1
  Filled 2024-03-22: qty 9, 23d supply, fill #2
  Filled 2024-04-17: qty 9, 23d supply, fill #3
  Filled 2024-05-09: qty 9, 23d supply, fill #4
  Filled 2024-06-01: qty 9, 23d supply, fill #5

## 2024-01-19 ENCOUNTER — Other Ambulatory Visit (HOSPITAL_COMMUNITY): Payer: Self-pay

## 2024-01-20 ENCOUNTER — Other Ambulatory Visit (HOSPITAL_COMMUNITY): Payer: Self-pay

## 2024-01-21 ENCOUNTER — Other Ambulatory Visit (HOSPITAL_COMMUNITY): Payer: Self-pay

## 2024-01-21 ENCOUNTER — Other Ambulatory Visit: Payer: Self-pay

## 2024-01-24 ENCOUNTER — Other Ambulatory Visit (HOSPITAL_COMMUNITY): Payer: Self-pay

## 2024-01-24 MED ORDER — INSULIN PEN NEEDLE 32G X 4 MM MISC
3 refills | Status: AC
  Filled 2024-01-24: qty 100, 30d supply, fill #0
  Filled 2024-05-09: qty 100, 90d supply, fill #1

## 2024-01-31 ENCOUNTER — Other Ambulatory Visit (HOSPITAL_COMMUNITY): Payer: Self-pay

## 2024-02-11 ENCOUNTER — Other Ambulatory Visit (HOSPITAL_COMMUNITY): Payer: Self-pay

## 2024-02-11 MED ORDER — OZEMPIC (0.25 OR 0.5 MG/DOSE) 2 MG/3ML ~~LOC~~ SOPN
0.2500 mg | PEN_INJECTOR | SUBCUTANEOUS | 0 refills | Status: AC
  Filled 2024-02-11: qty 3, 28d supply, fill #0

## 2024-02-11 MED ORDER — OZEMPIC (0.25 OR 0.5 MG/DOSE) 2 MG/3ML ~~LOC~~ SOPN
PEN_INJECTOR | SUBCUTANEOUS | 3 refills | Status: DC
  Filled 2024-02-11: qty 9, 84d supply, fill #0

## 2024-02-22 ENCOUNTER — Other Ambulatory Visit (HOSPITAL_COMMUNITY): Payer: Self-pay

## 2024-02-23 ENCOUNTER — Other Ambulatory Visit (HOSPITAL_COMMUNITY): Payer: Self-pay

## 2024-02-24 ENCOUNTER — Other Ambulatory Visit (HOSPITAL_COMMUNITY): Payer: Self-pay

## 2024-02-24 MED ORDER — LOSARTAN POTASSIUM 100 MG PO TABS
100.0000 mg | ORAL_TABLET | Freq: Every morning | ORAL | 3 refills | Status: AC
  Filled 2024-02-24: qty 90, 90d supply, fill #0

## 2024-03-13 ENCOUNTER — Other Ambulatory Visit (HOSPITAL_COMMUNITY): Payer: Self-pay

## 2024-03-13 MED ORDER — MOUNJARO 2.5 MG/0.5ML ~~LOC~~ SOAJ
2.5000 mg | SUBCUTANEOUS | 0 refills | Status: AC
  Filled 2024-03-13 – 2024-06-01 (×2): qty 2, 28d supply, fill #0

## 2024-03-13 MED ORDER — MOUNJARO 5 MG/0.5ML ~~LOC~~ SOAJ
5.0000 mg | SUBCUTANEOUS | 3 refills | Status: AC
  Filled 2024-03-13: qty 6, 84d supply, fill #0

## 2024-03-22 ENCOUNTER — Other Ambulatory Visit (HOSPITAL_COMMUNITY): Payer: Self-pay

## 2024-03-27 ENCOUNTER — Other Ambulatory Visit (HOSPITAL_COMMUNITY): Payer: Self-pay

## 2024-03-27 ENCOUNTER — Other Ambulatory Visit: Payer: Self-pay

## 2024-04-10 ENCOUNTER — Other Ambulatory Visit (HOSPITAL_COMMUNITY): Payer: Self-pay

## 2024-04-10 MED ORDER — AMLODIPINE BESYLATE 5 MG PO TABS: 5.0000 mg | ORAL_TABLET | Freq: Every day | ORAL | 3 refills | Status: AC

## 2024-04-10 MED ORDER — PREGABALIN 200 MG PO CAPS
200.0000 mg | ORAL_CAPSULE | Freq: Two times a day (BID) | ORAL | 5 refills | Status: AC
  Filled 2024-04-24: qty 60, 30d supply, fill #0
  Filled 2024-05-25: qty 60, 30d supply, fill #1

## 2024-04-17 ENCOUNTER — Other Ambulatory Visit (HOSPITAL_COMMUNITY): Payer: Self-pay

## 2024-04-24 ENCOUNTER — Other Ambulatory Visit (HOSPITAL_COMMUNITY): Payer: Self-pay

## 2024-04-24 ENCOUNTER — Other Ambulatory Visit: Payer: Self-pay

## 2024-05-09 ENCOUNTER — Other Ambulatory Visit (HOSPITAL_COMMUNITY): Payer: Self-pay

## 2024-06-01 ENCOUNTER — Other Ambulatory Visit (HOSPITAL_COMMUNITY): Payer: Self-pay

## 2024-06-12 ENCOUNTER — Encounter: Payer: Self-pay | Admitting: Gastroenterology
